# Patient Record
Sex: Male | Born: 1970 | Hispanic: Yes | Marital: Married | Smoking: Never smoker
Health system: Southern US, Community
[De-identification: ages and names within clinical notes are randomized; demographics above are authoritative.]

## PROBLEM LIST (undated history)

## (undated) DIAGNOSIS — I2699 Other pulmonary embolism without acute cor pulmonale: Secondary | ICD-10-CM

## (undated) DIAGNOSIS — T7840XA Allergy, unspecified, initial encounter: Secondary | ICD-10-CM

## (undated) DIAGNOSIS — C719 Malignant neoplasm of brain, unspecified: Secondary | ICD-10-CM

## (undated) DIAGNOSIS — E7800 Pure hypercholesterolemia, unspecified: Secondary | ICD-10-CM

## (undated) DIAGNOSIS — Z923 Personal history of irradiation: Secondary | ICD-10-CM

## (undated) DIAGNOSIS — C801 Malignant (primary) neoplasm, unspecified: Secondary | ICD-10-CM

## (undated) DIAGNOSIS — J189 Pneumonia, unspecified organism: Secondary | ICD-10-CM

## (undated) HISTORY — PX: TENDON REPAIR: SHX5111

## (undated) HISTORY — PX: OTHER SURGICAL HISTORY: SHX169

## (undated) SURGERY — BRONCHOSCOPY, WITH FLUOROSCOPY
Anesthesia: Moderate Sedation

---

## 2001-03-09 ENCOUNTER — Emergency Department (HOSPITAL_COMMUNITY): Admission: EM | Admit: 2001-03-09 | Discharge: 2001-03-10 | Payer: Self-pay | Admitting: Emergency Medicine

## 2001-03-10 ENCOUNTER — Encounter: Payer: Self-pay | Admitting: Emergency Medicine

## 2005-01-15 ENCOUNTER — Ambulatory Visit (HOSPITAL_COMMUNITY): Admission: RE | Admit: 2005-01-15 | Discharge: 2005-01-15 | Payer: Self-pay | Admitting: Orthopedic Surgery

## 2005-01-15 ENCOUNTER — Ambulatory Visit (HOSPITAL_BASED_OUTPATIENT_CLINIC_OR_DEPARTMENT_OTHER): Admission: RE | Admit: 2005-01-15 | Discharge: 2005-01-15 | Payer: Self-pay | Admitting: Orthopedic Surgery

## 2005-11-21 ENCOUNTER — Ambulatory Visit: Payer: Self-pay | Admitting: Internal Medicine

## 2006-01-27 ENCOUNTER — Ambulatory Visit: Payer: Self-pay | Admitting: Internal Medicine

## 2006-02-19 ENCOUNTER — Ambulatory Visit (HOSPITAL_BASED_OUTPATIENT_CLINIC_OR_DEPARTMENT_OTHER): Admission: RE | Admit: 2006-02-19 | Discharge: 2006-02-19 | Payer: Self-pay | Admitting: Orthopedic Surgery

## 2007-01-29 ENCOUNTER — Ambulatory Visit: Payer: Self-pay | Admitting: Internal Medicine

## 2007-02-03 ENCOUNTER — Ambulatory Visit: Payer: Self-pay | Admitting: *Deleted

## 2007-02-12 ENCOUNTER — Ambulatory Visit: Payer: Self-pay | Admitting: Internal Medicine

## 2007-02-23 ENCOUNTER — Ambulatory Visit: Payer: Self-pay | Admitting: Internal Medicine

## 2007-05-22 ENCOUNTER — Ambulatory Visit: Payer: Self-pay | Admitting: Nurse Practitioner

## 2007-05-22 DIAGNOSIS — J029 Acute pharyngitis, unspecified: Secondary | ICD-10-CM

## 2007-05-22 DIAGNOSIS — B009 Herpesviral infection, unspecified: Secondary | ICD-10-CM | POA: Insufficient documentation

## 2007-05-22 LAB — CONVERTED CEMR LAB
Cholesterol, target level: 200 mg/dL
HDL goal, serum: 40 mg/dL
LDL Goal: 160 mg/dL
Rapid Strep: NEGATIVE

## 2007-05-27 ENCOUNTER — Encounter (INDEPENDENT_AMBULATORY_CARE_PROVIDER_SITE_OTHER): Payer: Self-pay | Admitting: *Deleted

## 2008-02-17 ENCOUNTER — Ambulatory Visit: Payer: Self-pay | Admitting: Nurse Practitioner

## 2008-02-17 LAB — CONVERTED CEMR LAB
Basophils Absolute: 0 10*3/uL (ref 0.0–0.1)
Basophils Relative: 0 % (ref 0–1)
Eosinophils Absolute: 0.2 10*3/uL (ref 0.0–0.7)
Eosinophils Relative: 4 % (ref 0–5)
HCT: 44.7 % (ref 39.0–52.0)
Hemoglobin: 15.7 g/dL (ref 13.0–17.0)
Lymphocytes Relative: 39 % (ref 12–46)
Lymphs Abs: 2.1 10*3/uL (ref 0.7–4.0)
MCHC: 35.1 g/dL (ref 30.0–36.0)
MCV: 85.6 fL (ref 78.0–100.0)
Monocytes Absolute: 0.4 10*3/uL (ref 0.1–1.0)
Monocytes Relative: 8 % (ref 3–12)
Neutro Abs: 2.6 10*3/uL (ref 1.7–7.7)
Neutrophils Relative %: 49 % (ref 43–77)
Platelets: 242 10*3/uL (ref 150–400)
RBC: 5.22 M/uL (ref 4.22–5.81)
RDW: 12.5 % (ref 11.5–15.5)
WBC: 5.3 10*3/uL (ref 4.0–10.5)

## 2008-02-18 ENCOUNTER — Ambulatory Visit (HOSPITAL_COMMUNITY): Admission: RE | Admit: 2008-02-18 | Discharge: 2008-02-18 | Payer: Self-pay | Admitting: Nurse Practitioner

## 2008-02-19 ENCOUNTER — Encounter (INDEPENDENT_AMBULATORY_CARE_PROVIDER_SITE_OTHER): Payer: Self-pay | Admitting: Nurse Practitioner

## 2008-02-22 ENCOUNTER — Telehealth (INDEPENDENT_AMBULATORY_CARE_PROVIDER_SITE_OTHER): Payer: Self-pay | Admitting: Nurse Practitioner

## 2008-02-22 ENCOUNTER — Telehealth (INDEPENDENT_AMBULATORY_CARE_PROVIDER_SITE_OTHER): Payer: Self-pay | Admitting: *Deleted

## 2008-04-01 ENCOUNTER — Ambulatory Visit: Payer: Self-pay | Admitting: Nurse Practitioner

## 2008-04-01 DIAGNOSIS — M76899 Other specified enthesopathies of unspecified lower limb, excluding foot: Secondary | ICD-10-CM | POA: Insufficient documentation

## 2008-04-01 DIAGNOSIS — J309 Allergic rhinitis, unspecified: Secondary | ICD-10-CM | POA: Insufficient documentation

## 2008-04-05 ENCOUNTER — Ambulatory Visit: Payer: Self-pay | Admitting: Internal Medicine

## 2008-04-07 ENCOUNTER — Ambulatory Visit: Payer: Self-pay | Admitting: Internal Medicine

## 2008-04-26 ENCOUNTER — Ambulatory Visit: Payer: Self-pay | Admitting: Internal Medicine

## 2010-07-25 ENCOUNTER — Ambulatory Visit: Payer: Self-pay | Admitting: Nurse Practitioner

## 2010-07-25 DIAGNOSIS — B9789 Other viral agents as the cause of diseases classified elsewhere: Secondary | ICD-10-CM | POA: Insufficient documentation

## 2010-07-26 LAB — CONVERTED CEMR LAB
Basophils Absolute: 0 10*3/uL (ref 0.0–0.1)
Basophils Relative: 1 % (ref 0–1)
Eosinophils Absolute: 0.2 10*3/uL (ref 0.0–0.7)
Eosinophils Relative: 4 % (ref 0–5)
HCT: 42.6 % (ref 39.0–52.0)
Hemoglobin: 15.2 g/dL (ref 13.0–17.0)
Lymphocytes Relative: 29 % (ref 12–46)
Lymphs Abs: 1.6 10*3/uL (ref 0.7–4.0)
MCHC: 35.7 g/dL (ref 30.0–36.0)
MCV: 84 fL (ref 78.0–100.0)
Monocytes Absolute: 0.3 10*3/uL (ref 0.1–1.0)
Monocytes Relative: 6 % (ref 3–12)
Neutro Abs: 3.5 10*3/uL (ref 1.7–7.7)
Neutrophils Relative %: 62 % (ref 43–77)
Platelets: 246 10*3/uL (ref 150–400)
RBC: 5.07 M/uL (ref 4.22–5.81)
RDW: 12.3 % (ref 11.5–15.5)
WBC: 5.7 10*3/uL (ref 4.0–10.5)

## 2010-10-10 NOTE — Letter (Signed)
Summary: Out of Work  [facility]  [address] [place], [place] [address]   Phone: [phone]  Fax: [phone]    July 25, 2010   Employee:  Jones Broom    To Whom It May Concern:   For Medical reasons, please excuse the above named employee from work for the following dates:  Start:   July 25, 2010 (Wednesday) Seen in office today   July 26, 2010 (Thursday) Off End:   July 27, 2010 (Friday) May return to work  If you need additional information, please feel free to contact our office.         Sincerely,    Lehman Prom FNP

## 2010-10-10 NOTE — Letter (Signed)
Summary: Handout Printed  Printed Handout:  - Viral Illness 

## 2010-10-10 NOTE — Assessment & Plan Note (Signed)
Summary: Acute - Viral illness   Vital Signs:  Patient profile:   40 year old male Height:      66. inches Weight:      168.8 pounds BMI:     27.34 O2 Sat:      98 % on Room air Temp:     98.2 degrees F oral Pulse rate:   80 / minute Pulse rhythm:   regular Resp:     20 per minute BP sitting:   108 / 66  (left arm) Cuff size:   regular  Vitals Entered By: Levon Hedger (July 25, 2010 2:47 PM)  Nutrition Counseling: Patient's BMI is greater than 25 and therefore counseled on weight management options.  O2 Flow:  Room air CC: cold with chest tightness , joint pain  that has been going on since Sunday..he says it makes it very hard for him to breath Is Patient Diabetic? No Pain Assessment Patient in pain? no     Location: chest  Does patient need assistance? Functional Status Self care Ambulation Normal Comments he has been taking an over the counter generic of dayquil/nightquil   CC:  cold with chest tightness  and joint pain  that has been going on since Sunday..he says it makes it very hard for him to breath.  History of Present Illness:  Pt into the office with complaints of chest congestion. Symptoms started 3 days ago.  He missed 2 days out of work and was not able to go today due to sickness. slight cough +headache +joint ache +fever Denies any nausea and vomiting pt has taken daytime liquid tablets at home for the symptoms.  He started to take on yesterday. Also reports that oldest child is also sick.   Habits & Providers  Alcohol-Tobacco-Diet     Alcohol drinks/day: 0     Tobacco Status: never  Exercise-Depression-Behavior     Does Patient Exercise: no     Drug Use: no     Seat Belt Use: 100     Sun Exposure: occasionally  Allergies: 1)  ! Pcn 2)  ! Augmentin  Review of Systems General:  Complains of chills, fever, and weakness; denies loss of appetite. ENT:  Denies earache, nasal congestion, and sore throat. CV:  Complains of  fatigue. Resp:  Complains of cough; denies shortness of breath. GI:  Denies nausea and vomiting. MS:  Complains of joint pain.  Physical Exam  General:  alert.   Head:  normocephalic.   Ears:  bil ears with clear fluid Lungs:  normal breath sounds.   Heart:  normal rate and regular rhythm.   Msk:  up to the exam table Neurologic:  alert & oriented X3.     Impression & Recommendations:  Problem # 1:  VIRAL INFECTION (ICD-079.99) will check cbc today advised pt to stay out of work today and tomorrow handout given on viral illness advised pt to take theraflu over the counter for symptoms The following medications were removed from the medication list:    Ibuprofen 800 Mg Tabs (Ibuprofen) .Marland Kitchen... 1 tablet by mouth three times a day x 3 days then prn  Orders: T-CBC w/Diff (16109-60454) Pulse Oximetry (single measurment) (09811)  Patient Instructions: 1)  Your blood will be checked today.  Results will be back tomorrow to determine if illness is viral or bacterial. 2)  Viral infections are treated with symptomatic management. 3)  Bacterial infections are treated with antibiotics.  It is important to know you have bacteria  before taking antibiotics. 4)  You will be called on tomorrow with the results. 5)  I will write you a note for work today and tomorrow. 6)  You should get theraflu over the counter and take tonight and tomorrow.  Also rest is important.  You are probably going to feel very weak.  drink plenty of fluids.   Orders Added: 1)  Est. Patient Level III [11914] 2)  T-CBC w/Diff [[id number]] 3)  Pulse Oximetry (single measurment) [86578]

## 2011-01-25 NOTE — Op Note (Signed)
Chad George, Chad George             ACCOUNT NO.:  [id number]   MEDICAL RECORD NO.:  [id number]          PATIENT TYPE:  AMB   LOCATION:  DSC                          FACILITY:  [facility]   PHYSICIAN:  Cindee Salt, M.D.       DATE OF BIRTH:  06/08/1971   DATE OF PROCEDURE:  01/15/2005  DATE OF DISCHARGE:                                 OPERATIVE REPORT   PREOPERATIVE DIAGNOSIS:  Ulnocarpal abutment triangle fibrocartilage complex  tear, right wrist.   POSTOPERATIVE DIAGNOSIS:  Ulnocarpal abutment triangle fibrocartilage  complex tear, right wrist.   OPERATION:  Arthroscopy with debridement triangle fibrocartilage traumatic  tear with ulnar shortening osteotomy, right wrist.   SURGEON:  Dr. Merlyn Lot   ASSISTANT:  Carolyne Fiscal R.N.   ANESTHESIA:  General.   HISTORY:  The patient is a 40 year old male with a history of ulnar-sided  wrist pain.  He has undergone an MRA of his wrist, revealing a triangle  fibrocartilage complex tear on examination.  With this compression, he is  noted to have a long ulna.   PROCEDURE:  The patient is brought to the operating room where a general  anesthetic was carried out without difficulty, was prepped, draped using  DuraPrep, supine position, right arm free.  The limb was placed in the  arthroscopy tower, 10 pounds of traction applied, joint inflated with the  3/4 portal.  A blunt trocar was used enter the joint after making a  transverse incision through skin only.  Care was taken to avoid the extensor  pollicis longus tendon.  The scope was introduced, inspected.  The volar  radial wrist ligaments were intact.  The scapholunate ligament was intact.  The cartilage showed no changes on the distal radius, the scaphoid, the  lunate.  Lunotriquetral joint showed no change.  Irrigation catheter was  placed in 6U.  A 4-5 portal was opened after localization with 22 gauge  needle.  Triangle fibrocartilage showed a transverse tear across mid  section.  The dorsal and  palmar condensations were intact.  The scope was  then introduced in the ulnar aspect.  The lunotriquetral joint showed no  injury.  Midcarpal joint was then inspected through the radial midcarpal  portal after inflation with saline.  The scope introduced, the SVT joint  showed no changes.  There were no cartilage changes on the distal portion of  the proximal or proximal portion of the distal row.  There was type 2 hamate  facet in the lunate.  There there was no change in the hamate or in the  lunate.  The scope was reintroduced into the 03/04 portal.  An ArthroWand  wand was used to debride the triangle fibrocartilage along with a suction  punch.  This converted the tear to a traumatic tear to a degenerative-type  tear with the ulnar head being visible.  There were some chondromatotic  changes present.  It was decided preoperatively that if this was a not  repairable traumatic-type tear, then an ulnar shortening osteotomy would be  performed.  The limb was exsanguinated with an Esmarch bandage after removal  of  the arthroscopy instruments, an incision made on the ulnar aspect of the  wrist, carried down through subcutaneous tissue, bleeders electrocauterized,  neural structures protected.  The dissection carried down between the fifth  and sixth dorsal compartment to the distal ulna.  A six-hole DCP plate was  then applied distally with three 16 mm screws.  The screws were then  removed, the plate rotated, and osteotomy was then performed after placing  retractors to protect the soft tissues.  This removed approximately 3 mm of  ulna. The distal plate was reapproximated with the most proximal screw being  under compression.  This was not tightened.  The alignment was established  after being marked prior to the osteotomy site. Clamps were placed, and the  proximal screws were inserted.  These each measured 16 mm.  Each was placed  under compression progressively, and the final compression  screw distally  was tightened.  X-rays confirmed good shortening of the ulna with no further  ulnocarpal abutment.  The osteotomy site showed to be entirely closed.  Small portions of bone were used as a bone graft, the wound irrigated.  The  fascia was closed with a running 4-0 Vicryl suture, the subcutaneous tissue  with 4-0 Vicryl, and skin with subcuticular 4-0 Monocryl suture.  The  arthroscopy portals were closed with interrupted 5-0 nylon sutures.  Sterile  compressive dressing and long-arm splint applied.  The patient tolerated the  procedure well.  Prior to the placement of the splint, the arm was placed  through full pronation and  supination without difficulty or any clicks or  crepitation.  The patient tolerated the procedure well was taken to the  recovery room for observation in satisfactory condition.  On deflation of  the tourniquet, all fingers immediately pinked.  He will be kept overnight  for pain control and discharged on Percocet.      GK/MEDQ  D:  01/15/2005  T:  01/15/2005  Job:  [id number]

## 2011-01-25 NOTE — Op Note (Signed)
Chad George, Chad George             ACCOUNT NO.:  [id number]   MEDICAL RECORD NO.:  [id number]          PATIENT TYPE:  AMB   LOCATION:  DSC                          FACILITY:  [facility]   PHYSICIAN:  Cindee Salt, M.D.       DATE OF BIRTH:  04-11-1971   DATE OF PROCEDURE:  DATE OF DISCHARGE:                                 OPERATIVE REPORT   PREOPERATIVE DIAGNOSIS:  Status post ulnar shortening osteotomy, right ulna.   POSTOPERATIVE DIAGNOSIS:  Status post ulnar shortening osteotomy, right  ulna.   OPERATION:  Removal plate and screws, right ulna.   SURGEON:  Dr. Merlyn Lot.   ASSISTANT:  Carolyne Fiscal R.N.   ANESTHESIA:  General.   HISTORY:  The patient is a 40 year old male, with history of ulnocarpal  abutment.  He has undergone ulnar shortening osteotomy.  This has healed.  He has irritation of the plate, desires removal of plate and screws.  He is  aware of risks and complications, which were thoroughly discussed prior to  the surgery.  In the preoperative area, the arm was marked by both the  patient and surgeon, questions encouraged and answered.  Antibiotic was  ordered.   PROCEDURE:  The patient was brought to the operating room, where general  anesthetic was carried out without difficulty was prepped using DuraPrep,  supine position, right arm free.  The limb was exsanguinated with an Esmarch  bandage and tourniquet placed on the arm  was inflated to 250 mmHg.  The old  incision was used carried down through subcutaneous tissue.  Bleeders were  electrocauterized.  Dissection carried down to the interval between the  extensor digiti quinti and to the extensor carpi ulnaris.  Dissection  carried down to the plate.  This was identified.  Freer elevator was used to  elevate the periosteum from the plate without difficulty.  The plate was  removed.  The osteotomy site was fully healed.  No further lesions were  identified.  The interval between the screw and plate were granulation  tissue,  where scar had occurred were removed.  The wound was again  irrigated.  The fascia was then closed with a running 4-0 Vicryl suture.  The subcutaneous tissue with 4-0 Vicryl and skin with a subcuticular 4-0  Monocryl suture.  Steri-Strips were applied.  Sterile compressive dressing  and wrist splint applied.  The patient tolerated the procedure well.  He was  taken to the recovery for observation in satisfactory condition.  He is  discharged home to return to __________ [facility] in 1 week on Vicodin.           ______________________________  Cindee Salt, M.D.     GK/MEDQ  D:  02/19/2006  T:  02/19/2006  Job:  [id number]

## 2013-03-24 ENCOUNTER — Emergency Department (INDEPENDENT_AMBULATORY_CARE_PROVIDER_SITE_OTHER)
Admission: EM | Admit: 2013-03-24 | Discharge: 2013-03-24 | Disposition: A | Discharge status: Home / Self Care | Payer: Self-pay | Source: Home / Self Care

## 2013-03-24 ENCOUNTER — Emergency Department (INDEPENDENT_AMBULATORY_CARE_PROVIDER_SITE_OTHER): Payer: Self-pay

## 2013-03-24 ENCOUNTER — Encounter (HOSPITAL_COMMUNITY): Payer: Self-pay | Admitting: Emergency Medicine

## 2013-03-24 DIAGNOSIS — M79609 Pain in unspecified limb: Secondary | ICD-10-CM

## 2013-03-24 DIAGNOSIS — M79671 Pain in right foot: Secondary | ICD-10-CM

## 2013-03-24 MED ORDER — TRAMADOL HCL 50 MG PO TABS: 50.0000 mg | ORAL_TABLET | Freq: Four times a day (QID) | ORAL | Status: DC | PRN

## 2013-03-24 MED ORDER — AMITRIPTYLINE HCL 25 MG PO TABS: 25.0000 mg | ORAL_TABLET | Freq: Every evening | ORAL | Status: DC | PRN

## 2013-03-24 NOTE — ED Provider Notes (Addendum)
Chad George is a 42 y.o. male who presents to Urgent Care today for bilateral lower extremity pain. Starting about one week ago without injury. Patient has pain from the metatarsals on the plantar aspect of both feet especially around the second metatarsal heads to the plantar calcaneus radiating to the knees and the posterior aspect of the legs. This is worse on the left than the right. No weakness or numbness noted. Pain is quite severe and limiting the patient's ability to work. He works as an Personnel officer frequently climbs ladders and denies any change in his normal activity. He denies any fevers or chills bowel or bladder dysfunctions.    PMH reviewed. Healthy otherwise History  Substance Use Topics  . Smoking status: Never Smoker   . Smokeless tobacco: Not on file  . Alcohol Use: No   ROS as above Medications reviewed. No current facility-administered medications for this encounter.   Current Outpatient Prescriptions  Medication Sig Dispense Refill  . amitriptyline (ELAVIL) 25 MG tablet Take 1 tablet (25 mg total) by mouth at bedtime as needed for pain.  30 tablet  1  . traMADol (ULTRAM) 50 MG tablet Take 1 tablet (50 mg total) by mouth every 6 (six) hours as needed for pain.  50 tablet  0    Exam:  BP 122/77  Pulse 67  Temp(Src) 98.5 F (36.9 C) (Oral)  Resp 17  SpO2 97% Gen: Well NAD Exts: Non edematous BL  LE, both extremities have intact pulses and capillary refill however are slightly cold and clammy to the touch.  Quite sensitive to touch bilateral plantar and dorsal feet.  MSK: Tender palpation bilateral plantar metatarsal heads metatarsal #2.  Tender palpation bilateral plantar calcaneus.  Nontender into the calf with no change in pain with foot and knee motion.  Normal motion and strength.   No results found for this or any previous visit (from the past 24 hour(s)). Dg Foot Complete Left  03/24/2013   *RADIOLOGY REPORT*  Clinical Data: Foot pain  LEFT FOOT -  COMPLETE 3+ VIEW  Comparison: None.  Findings: There is no evidence of fracture or dislocation.  There is no evidence of arthropathy or other focal bone abnormality. Soft tissues are unremarkable.  IMPRESSION: Negative exam.   Original Report Authenticated By: Signa Kell, M.D.   Dg Foot Complete Right  03/24/2013   *RADIOLOGY REPORT*  Clinical Data: Right foot pain  RIGHT FOOT COMPLETE - 3+ VIEW  Comparison: None  Findings: There is no evidence of fracture or dislocation.  There is no evidence of arthropathy or other focal bone abnormality. Soft tissues are unremarkable.  IMPRESSION: Negative exam.   Original Report Authenticated By: Signa Kell, M.D.    Assessment and Plan: 42 y.o. male with unexplained bilateral foot pain.  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  Doubtful for serious pathology such as claudication or infection.  Additionally down for for severe lumbar radicular cause as pain is mostly confined to the feet.  Plan to treat empirically with amitriptyline at night and tramadol as needed.  Follow up with sports medicine Center if not improving Provided information for the orange card.  Discussed warning signs or symptoms. Please see discharge instructions. Patient expresses understanding.      Rodolph Bong, MD 03/24/13 1743  Rodolph Bong, MD 03/24/13 [phone]

## 2013-03-24 NOTE — ED Notes (Signed)
Pt reports bilateral foot pain that started under great toe and radiates through out bottom of foot to the achilles pain has gradually gotten worse and is also radiating up to the posterior portion of the knee.  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 Pt denies injury. Pt has used ibuprofen with no relief in symptoms.

## 2013-07-01 ENCOUNTER — Ambulatory Visit: Payer: No Typology Code available for payment source | Attending: Internal Medicine

## 2013-08-16 ENCOUNTER — Ambulatory Visit: Payer: No Typology Code available for payment source | Attending: Internal Medicine | Admitting: Internal Medicine

## 2013-08-16 ENCOUNTER — Encounter: Payer: Self-pay | Admitting: Internal Medicine

## 2013-08-16 VITALS — BP 115/83 | HR 64 | Temp 98.7°F | Resp 16 | Ht 65.0 in | Wt 172.0 lb

## 2013-08-16 DIAGNOSIS — Z0000 Encounter for general adult medical examination without abnormal findings: Secondary | ICD-10-CM

## 2013-08-16 NOTE — Patient Instructions (Signed)
Prostate-Specific Antigen The prostate-specific antigen (PSA) is a blood test. It is used to help detect early forms of prostate cancer. The test is usually used along with other tests. The test is also used to follow the course of those who already have prostate cancer or who have been treated for prostate cancer. Some factors interfere with the results of the PSA. The factors listed below will either increase or decrease the PSA levels. They are:  Prescriptions used for male baldness.  Some herbs.  Active prostate infection.  Prior instrumentation or urinary catheterization.  Ejaculation up to 2 days prior to testing.  A noncancerous enlargement of the prostate.  Inflammation of the prostate.  Active urinary tract infection. If your test results are elevated, your caregiver will discuss the results with you. Your caregiver will also let you know if more evaluation is needed. PREPARATION FOR TEST No preparation or fasting is necessary. NORMAL FINDINGS Less than 4 ng/mL or Less than 4mcg/L (SI units) Ranges for normal findings may vary among different laboratories and hospitals. You should always check with your caregiver after having lab work or other tests done to discuss the meaning of your test results and whether your values are considered within normal limits. MEANING OF TEST  A normal value means prostate cancer is less likely. The chance of having prostate cancer increases if the value is between 4 ng/mL and 10 ng/mL. However, further testing will be needed. Values above 10 ng/mL indicate that there is a much higher chance of having prostate cancer (if the above situations that raise PSA are not present). Your caregiver will go over your test results with you and discuss the importance of this test. If this value is elevated, your caregiver may recommend further testing or evaluation. OBTAINING THE TEST RESULTS It is your responsibility to obtain your test results. Ask the lab or  department performing the test when and how you will get your results. Document Released: 09/28/2004 Document Revised: 11/18/2011 Document Reviewed: 04/03/2007 Exit[employer] Patient Information 2014 [employer].  

## 2013-08-16 NOTE — Progress Notes (Signed)
Patient ID: Chad George, male   DOB: [birth year], 42 y.o.   MRN: [id number] Patient Demographics  Chad George, is a 42 y.o. male  NWG:[id number]  [id number]  DOB - 15-Dec-1970  CC:  Chief Complaint  Patient presents with  . Establish Care       HPI: Chad George is a 42 y.o. male here today to establish medical care. No significant past medical history. He is here for a physical exam. He has no significant complaint. He is doing very well, he does not smoke cigarette, she does not drink alcohol. No significant family history of diabetes or hypertension or cancer. Patient has No headache, No chest pain, No abdominal pain - No Nausea, No new weakness tingling or numbness, No Cough - SOB.  Allergies  Allergen Reactions  . Amoxicillin-Pot Clavulanate   . Penicillins     REACTION: RASH   History reviewed. No pertinent past medical history. Current Outpatient Prescriptions on File Prior to Visit  Medication Sig Dispense Refill  . amitriptyline (ELAVIL) 25 MG tablet Take 1 tablet (25 mg total) by mouth at bedtime as needed for pain.  30 tablet  1  . traMADol (ULTRAM) 50 MG tablet Take 1 tablet (50 mg total) by mouth every 6 (six) hours as needed for pain.  50 tablet  0   No current facility-administered medications on file prior to visit.   Family History  Problem Relation Age of Onset  . Stroke Father    History   Social History  . Marital Status: Single    Spouse Name: N/A    Number of Children: N/A  . Years of Education: N/A   Occupational History  . Not on file.   Social History Main Topics  . Smoking status: Never Smoker   . Smokeless tobacco: Not on file  . Alcohol Use: No  . Drug Use: No  . Sexual Activity: Yes   Other Topics Concern  . Not on file   Social History Narrative  . No narrative on file    Review of Systems: Constitutional: Negative for fever, chills, diaphoresis, activity change, appetite change and fatigue. HENT: Negative for ear pain,  nosebleeds, congestion, facial swelling, rhinorrhea, neck pain, neck stiffness and ear discharge.  Eyes: Negative for pain, discharge, redness, itching and visual disturbance. Respiratory: Negative for cough, choking, chest tightness, shortness of breath, wheezing and stridor.  Cardiovascular: Negative for chest pain, palpitations and leg swelling. Gastrointestinal: Negative for abdominal distention. Genitourinary: Negative for dysuria, urgency, frequency, hematuria, flank pain, decreased urine volume, difficulty urinating and dyspareunia.  Musculoskeletal: Negative for back pain, joint swelling, arthralgia and gait problem. Neurological: Negative for dizziness, tremors, seizures, syncope, facial asymmetry, speech difficulty, weakness, light-headedness, numbness and headaches.  Hematological: Negative for adenopathy. Does not bruise/bleed easily. Psychiatric/Behavioral: Negative for hallucinations, behavioral problems, confusion, dysphoric mood, decreased concentration and agitation.    Objective:   Filed Vitals:   08/16/13 1654  BP: 115/83  Pulse: 64  Temp: 98.7 F (37.1 C)  Resp: 16    Physical Exam: Constitutional: Patient appears well-developed and well-nourished. No distress. HENT: Normocephalic, atraumatic, External right and left ear normal. Oropharynx is clear and moist.  Eyes: Conjunctivae and EOM are normal. PERRLA, no scleral icterus. Neck: Normal ROM. Neck supple. No JVD. No tracheal deviation. No thyromegaly. CVS: RRR, S1/S2 +, no murmurs, no gallops, no carotid bruit.  Pulmonary: Effort and breath sounds normal, no stridor, rhonchi, wheezes, rales.  Abdominal: Soft. BS +, no distension, tenderness, rebound or guarding.  Musculoskeletal: Normal range of motion. No edema and no tenderness.  Lymphadenopathy: No lymphadenopathy noted, cervical, inguinal or axillary Neuro: Alert. Normal reflexes, muscle tone coordination. No cranial nerve deficit. Skin: Skin is warm and  dry. No rash noted. Not diaphoretic. No erythema. No pallor. Psychiatric: Normal mood and affect. Behavior, judgment, thought content normal.  Lab Results  Component Value Date   WBC 5.7 07/25/2010   HGB 15.2 07/25/2010   HCT 42.6 07/25/2010   MCV 84.0 07/25/2010   PLT 246 07/25/2010   No results found for this basename: CREATININE, BUN, NA, K, CL, CO2    No results found for this basename: HGBA1C   Lipid Panel  No results found for this basename: chol, trig, hdl, cholhdl, vldl, ldlcalc       Assessment and plan:   1. Annual physical exam  - CMP and Liver - CBC with Differential - Urinalysis, Complete - POCT glycosylated hemoglobin (Hb A1C) - Lipid panel - TSH  Follow up in  6 months or when necessary   The patient was given clear instructions to go to ER or return to medical center if symptoms don't improve, worsen or new problems develop. The patient verbalized understanding. The patient was told to call to get lab results if they haven't heard anything in the next week.     Jeanann Lewandowsky, MD, MHA, FACP, FAAP [facility] [place], [place] [phone]   08/16/2013, 5:26 PM

## 2013-08-16 NOTE — Progress Notes (Signed)
Pt is here to establish care. He has a history of GERD and allergy's.  Pt is requesting a physical and a check up.

## 2013-08-17 LAB — URINALYSIS, COMPLETE
Bilirubin Urine: NEGATIVE
Crystals: NONE SEEN
Glucose, UA: NEGATIVE mg/dL
Specific Gravity, Urine: 1.017 (ref 1.005–1.030)
Squamous Epithelial / LPF: NONE SEEN

## 2013-08-17 LAB — CMP AND LIVER
Alkaline Phosphatase: 78 U/L (ref 39–117)
BUN: 11 mg/dL (ref 6–23)
Glucose, Bld: 98 mg/dL (ref 70–99)
Indirect Bilirubin: 0.5 mg/dL (ref 0.0–0.9)
Sodium: 137 mEq/L (ref 135–145)
Total Bilirubin: 0.6 mg/dL (ref 0.3–1.2)
Total Protein: 7.1 g/dL (ref 6.0–8.3)

## 2013-08-17 LAB — CBC WITH DIFFERENTIAL/PLATELET
HCT: 43 % (ref 39.0–52.0)
Hemoglobin: 15.8 g/dL (ref 13.0–17.0)
Lymphocytes Relative: 31 % (ref 12–46)
Lymphs Abs: 1.8 10*3/uL (ref 0.7–4.0)
Monocytes Absolute: 0.3 10*3/uL (ref 0.1–1.0)
Monocytes Relative: 5 % (ref 3–12)
Neutro Abs: 3.5 10*3/uL (ref 1.7–7.7)
WBC: 5.9 10*3/uL (ref 4.0–10.5)

## 2013-08-17 LAB — LIPID PANEL
LDL Cholesterol: 92 mg/dL (ref 0–99)
Total CHOL/HDL Ratio: 5 Ratio
Triglycerides: 239 mg/dL — ABNORMAL HIGH (ref <150)
VLDL: 48 mg/dL — ABNORMAL HIGH (ref 0–40)

## 2013-08-20 ENCOUNTER — Telehealth: Payer: Self-pay | Admitting: *Deleted

## 2013-08-20 NOTE — Telephone Encounter (Signed)
Message copied by Raynelle Chary on Fri Aug 20, 2013 12:25 PM ------      Message from: Jeanann Lewandowsky E      Created: Thu Aug 19, 2013  4:05 PM       Please inform patient that most of his lab results are within normal limits. His cholesterol is slightly high, we recommend regular exercise 3 times a week 30 minutes each time, low-fat and low-cholesterol diet, we will repeat this test in 3 months ------

## 2013-08-20 NOTE — Telephone Encounter (Signed)
Message copied by Raynelle Chary on Fri Aug 20, 2013  3:02 PM ------      Message from: Jeanann Lewandowsky E      Created: Thu Aug 19, 2013  4:05 PM       Please inform patient that most of his lab results are within normal limits. His cholesterol is slightly high, we recommend regular exercise 3 times a week 30 minutes each time, low-fat and low-cholesterol diet, we will repeat this test in 3 months ------

## 2013-08-20 NOTE — Telephone Encounter (Signed)
Pt called and I told him his lab results.  I explained to the pt the dr. recommendation of exercise and a healthy diet.

## 2013-09-07 ENCOUNTER — Telehealth: Payer: Self-pay | Admitting: Internal Medicine

## 2013-09-07 ENCOUNTER — Telehealth: Payer: Self-pay | Admitting: Emergency Medicine

## 2013-09-07 NOTE — Telephone Encounter (Signed)
Attempted to leave message but no answer 

## 2013-10-01 ENCOUNTER — Encounter (HOSPITAL_COMMUNITY): Payer: Self-pay | Admitting: Emergency Medicine

## 2013-10-01 ENCOUNTER — Emergency Department (HOSPITAL_COMMUNITY)
Admission: EM | Admit: 2013-10-01 | Discharge: 2013-10-01 | Disposition: A | Discharge status: Home / Self Care | Payer: No Typology Code available for payment source | Source: Home / Self Care | Attending: Family Medicine | Admitting: Family Medicine

## 2013-10-01 ENCOUNTER — Emergency Department (INDEPENDENT_AMBULATORY_CARE_PROVIDER_SITE_OTHER): Payer: No Typology Code available for payment source

## 2013-10-01 DIAGNOSIS — J189 Pneumonia, unspecified organism: Secondary | ICD-10-CM

## 2013-10-01 HISTORY — DX: Pure hypercholesterolemia, unspecified: E78.00

## 2013-10-01 MED ORDER — CEFTRIAXONE SODIUM 1 G IJ SOLR
INTRAMUSCULAR | Status: AC
  Filled 2013-10-01: qty 10

## 2013-10-01 MED ORDER — CEFTRIAXONE SODIUM 1 G IJ SOLR
1.0000 g | Freq: Once | INTRAMUSCULAR | Status: AC
  Administered 2013-10-01: 1 g via INTRAMUSCULAR

## 2013-10-01 MED ORDER — MOXIFLOXACIN HCL 400 MG PO TABS: 400.0000 mg | ORAL_TABLET | Freq: Every day | ORAL | Status: DC

## 2013-10-01 NOTE — ED Provider Notes (Signed)
CSN: [id number]     Arrival date & time 10/01/13  1056 History   First MD Initiated Contact with Patient 10/01/13 1116     Chief Complaint  Patient presents with  . URI   (Consider location/radiation/quality/duration/timing/severity/associated sxs/prior Treatment) Patient is a 43 y.o. male presenting with URI. The history is provided by the patient and the spouse.  URI Presenting symptoms: congestion, cough, fever and rhinorrhea   Severity:  Moderate Onset quality:  Gradual Duration:  5 days Timing:  Constant Progression:  Unchanged Chronicity:  New Worsened by:  Nothing tried Ineffective treatments:  None tried Risk factors: sick contacts     Past Medical History  Diagnosis Date  . High cholesterol    Past Surgical History  Procedure Laterality Date  . Carpal tunner    . Tendon repair     Family History  Problem Relation Age of Onset  . Stroke Father    History  Substance Use Topics  . Smoking status: Never Smoker   . Smokeless tobacco: Not on file  . Alcohol Use: No    Review of Systems  Constitutional: Positive for fever.  HENT: Positive for congestion, postnasal drip and rhinorrhea.   Respiratory: Positive for cough.   Cardiovascular: Negative.   Gastrointestinal: Negative.     Allergies  Amoxicillin-pot clavulanate and Penicillins  Home Medications   Current Outpatient Rx  Name  Route  Sig  Dispense  Refill  . acetaminophen (TYLENOL) 325 MG tablet   Oral   Take 650 mg by mouth every 6 (six) hours as needed.         Marland Kitchen ibuprofen (ADVIL,MOTRIN) 200 MG tablet   Oral   Take 200 mg by mouth every 6 (six) hours as needed.         Marland Kitchen amitriptyline (ELAVIL) 25 MG tablet   Oral   Take 1 tablet (25 mg total) by mouth at bedtime as needed for pain.   30 tablet   1   . moxifloxacin (AVELOX) 400 MG tablet   Oral   Take 1 tablet (400 mg total) by mouth daily.   7 tablet   0   . traMADol (ULTRAM) 50 MG tablet   Oral   Take 1 tablet (50 mg total)  by mouth every 6 (six) hours as needed for pain.   50 tablet   0    There were no vitals taken for this visit. Physical Exam  Nursing note and vitals reviewed. Constitutional: He is oriented to person, place, and time. He appears well-developed and well-nourished.  HENT:  Right Ear: External ear normal.  Left Ear: External ear normal.  Nose: Nose normal.  Mouth/Throat: Oropharynx is clear and moist.  Eyes: Conjunctivae are normal. Pupils are equal, round, and reactive to light.  Neck: Normal range of motion. Neck supple.  Cardiovascular: Normal rate, regular rhythm, normal heart sounds and intact distal pulses.   Pulmonary/Chest: Effort normal. He has rales in the left upper field, the left middle field and the left lower field.  Abdominal: Soft. Bowel sounds are normal. There is no tenderness.  Lymphadenopathy:    He has no cervical adenopathy.  Neurological: He is alert and oriented to person, place, and time.  Skin: Skin is warm and dry.    ED Course  Procedures (including critical care time) Labs Review Labs Reviewed - No data to display Imaging Review Dg Chest 2 View  10/01/2013   CLINICAL DATA:  Fever and cough.  EXAM: CHEST  2  VIEW  COMPARISON:  None.  FINDINGS: The cardiomediastinal silhouette is within normal limits. The lungs are well inflated. There is airspace opacity in the left upper lobe. The right lung is clear. No pleural effusion is identified. No acute osseous abnormality is identified.  IMPRESSION: Left upper lobe opacity concerning for pneumonia.   Electronically Signed   By: Logan Bores   On: 10/01/2013 12:39    EKG Interpretation    Date/Time:    Ventricular Rate:    PR Interval:    QRS Duration:   QT Interval:    QTC Calculation:   R Axis:     Text Interpretation:              MDM  X-rays reviewed and report per radiologist.     Billy Fischer, MD 10/01/13 1355

## 2013-10-01 NOTE — ED Notes (Signed)
Patient not in treatment room, in rest room

## 2013-10-01 NOTE — ED Notes (Signed)
Patient aware of post injection delay prior to discharge.

## 2013-10-01 NOTE — ED Notes (Signed)
Patient has fever, cough, headache for 4 days, chest discomfort.  Frequent cough.

## 2013-10-01 NOTE — Discharge Instructions (Signed)
Drink plenty of fluids as discussed, use medicine as prescribed, and mucinex or delsym for cough. Return or see your doctor if further problems °

## 2013-10-08 ENCOUNTER — Ambulatory Visit: Payer: No Typology Code available for payment source | Attending: Internal Medicine

## 2013-10-08 ENCOUNTER — Other Ambulatory Visit: Payer: Self-pay | Admitting: Internal Medicine

## 2013-10-08 MED ORDER — GUAIFENESIN-DM 100-10 MG/5ML PO SYRP: 5.0000 mL | ORAL_SOLUTION | ORAL | Status: DC | PRN

## 2013-10-11 ENCOUNTER — Ambulatory Visit: Payer: No Typology Code available for payment source | Admitting: Internal Medicine

## 2013-10-16 ENCOUNTER — Ambulatory Visit: Payer: No Typology Code available for payment source | Attending: Internal Medicine | Admitting: Internal Medicine

## 2013-10-16 VITALS — BP 115/69 | HR 80 | Temp 98.5°F | Resp 16 | Ht 66.0 in | Wt 171.0 lb

## 2013-10-16 DIAGNOSIS — Z79899 Other long term (current) drug therapy: Secondary | ICD-10-CM | POA: Insufficient documentation

## 2013-10-16 DIAGNOSIS — J189 Pneumonia, unspecified organism: Secondary | ICD-10-CM | POA: Insufficient documentation

## 2013-10-16 DIAGNOSIS — J209 Acute bronchitis, unspecified: Secondary | ICD-10-CM

## 2013-10-16 DIAGNOSIS — Z880 Allergy status to penicillin: Secondary | ICD-10-CM | POA: Insufficient documentation

## 2013-10-16 DIAGNOSIS — E7800 Pure hypercholesterolemia, unspecified: Secondary | ICD-10-CM | POA: Insufficient documentation

## 2013-10-16 MED ORDER — FLUTICASONE PROPIONATE 50 MCG/ACT NA SUSP: 2.0000 | Freq: Every day | NASAL | Status: DC

## 2013-10-16 MED ORDER — ALBUTEROL SULFATE HFA 108 (90 BASE) MCG/ACT IN AERS: 2.0000 | INHALATION_SPRAY | Freq: Four times a day (QID) | RESPIRATORY_TRACT | Status: DC | PRN

## 2013-10-16 MED ORDER — GUAIFENESIN-CODEINE 100-10 MG/5ML PO SOLN: 10.0000 mL | Freq: Four times a day (QID) | ORAL | Status: DC | PRN

## 2013-10-16 MED ORDER — LORATADINE 10 MG PO TABS: 10.0000 mg | ORAL_TABLET | Freq: Every day | ORAL | Status: DC

## 2013-10-16 MED ORDER — LORATADINE 10 MG PO TABS: 10.0000 mg | ORAL_TABLET | Freq: Every day | ORAL | Status: AC

## 2013-10-16 NOTE — Progress Notes (Signed)
Patient ID: Chad George, male   DOB: 1971-01-06, 43 y.o.   MRN: [id number]   CC:  HPI: 43 year old male recently evaluated for pneumonia on 1/23. The patient presented with URI like symptoms, postnasal drip, rhinorrhea, cough and fever. X-ray showed left upper lung opacity concerning for pneumonia. Today the patient has completed 7 days of Avelox, has a residual cough. The patient complains of occasional wheezing, still has spasmodic cough associated with a headache and sinus congestion. No reported fever or sore throat    Allergies  Allergen Reactions  . Amoxicillin-Pot Clavulanate   . Penicillins     REACTION: RASH   Past Medical History  Diagnosis Date  . High cholesterol    Current Outpatient Prescriptions on File Prior to Visit  Medication Sig Dispense Refill  . acetaminophen (TYLENOL) 325 MG tablet Take 650 mg by mouth every 6 (six) hours as needed.      Marland Kitchen amitriptyline (ELAVIL) 25 MG tablet Take 1 tablet (25 mg total) by mouth at bedtime as needed for pain.  30 tablet  1  . traMADol (ULTRAM) 50 MG tablet Take 1 tablet (50 mg total) by mouth every 6 (six) hours as needed for pain.  50 tablet  0   No current facility-administered medications on file prior to visit.   Family History  Problem Relation Age of Onset  . Stroke Father    History   Social History  . Marital Status: Single    Spouse Name: N/A    Number of Children: N/A  . Years of Education: N/A   Occupational History  . Not on file.   Social History Main Topics  . Smoking status: Never Smoker   . Smokeless tobacco: Not on file  . Alcohol Use: No  . Drug Use: No  . Sexual Activity: Yes   Other Topics Concern  . Not on file   Social History Narrative  . No narrative on file    Review of Systems  Constitutional: As in history of present illness HENT: Negative for ear pain, nosebleeds, congestion, facial swelling, rhinorrhea, neck pain, neck stiffness and ear discharge.   Eyes: Negative for pain,  discharge, redness, itching and visual disturbance.  Respiratory: Positive for cough, choking, chest tightness, shortness of breath, wheezing and stridor.   Cardiovascular: Negative for chest pain, palpitations and leg swelling.  Gastrointestinal: Negative for abdominal distention.  Genitourinary: Negative for dysuria, urgency, frequency, hematuria, flank pain, decreased urine volume, difficulty urinating and dyspareunia.  Musculoskeletal: Negative for back pain, joint swelling, arthralgias and gait problem.  Neurological: Negative for dizziness, tremors, seizures, syncope, facial asymmetry, speech difficulty, weakness, light-headedness, numbness and headaches.  Hematological: Negative for adenopathy. Does not bruise/bleed easily.  Psychiatric/Behavioral: Negative for hallucinations, behavioral problems, confusion, dysphoric mood, decreased concentration and agitation.    Objective:   Filed Vitals:   10/16/13 0917  BP: 115/69  Pulse: 80  Temp: 98.5 F (36.9 C)  Resp: 16    Physical Exam  Constitutional: Appears well-developed and well-nourished. No distress.  HENT: Normocephalic. External right and left ear normal. Oropharynx is clear and moist.  Eyes: Conjunctivae and EOM are normal. PERRLA, no scleral icterus.  Neck: Normal ROM. Neck supple. No JVD. No tracheal deviation. No thyromegaly.  CVS: RRR, S1/S2 +, no murmurs, no gallops, no carotid bruit.  Pulmonary: Effort and breath sounds normal, no stridor, rhonchi, wheezes, rales.  Abdominal: Soft. BS +,  no distension, tenderness, rebound or guarding.  Musculoskeletal: Normal range of motion. No edema and  no tenderness.  Lymphadenopathy: No lymphadenopathy noted, cervical, inguinal. Neuro: Alert. Normal reflexes, muscle tone coordination. No cranial nerve deficit. Skin: Skin is warm and dry. No rash noted. Not diaphoretic. No erythema. No pallor.  Psychiatric: Normal mood and affect. Behavior, judgment, thought content normal.    Lab Results  Component Value Date   WBC 5.9 08/16/2013   HGB 15.8 08/16/2013   HCT 43.0 08/16/2013   MCV 83.0 08/16/2013   PLT 255 08/16/2013   Lab Results  Component Value Date   CREATININE 0.69 08/16/2013   BUN 11 08/16/2013   NA 137 08/16/2013   K 3.5 08/16/2013   CL 102 08/16/2013   CO2 27 08/16/2013    Lab Results  Component Value Date   HGBA1C 5.1 08/16/2013   Lipid Panel     Component Value Date/Time   CHOL 175 08/16/2013 1727   TRIG 239* 08/16/2013 1727   HDL 35* 08/16/2013 1727   CHOLHDL 5.0 08/16/2013 1727   VLDL 48* 08/16/2013 1727   LDLCALC 92 08/16/2013 1727       Assessment and plan:   Patient Active Problem List   Diagnosis Date Noted  . Annual physical exam 08/16/2013  . VIRAL INFECTION 07/25/2010  . ALLERGIC RHINITIS 04/01/2008  . BURSITIS, LEFT KNEE 04/01/2008  . HERPES LABIALIS 05/22/2007  . PHARYNGITIS 05/22/2007   Acute bronchitis/reactive airway disease following pneumonia Patient will be prescribed Flonase, albuterol as needed, for possibility of postinfectious reactive Start the patient on Claritin, Robitussin with codeine Because the patient has no fever do not think repeat chest x-rays needed  Patient will followup in 2 weeks if needed otherwise followup in 3 months      The patient was given clear instructions to go to ER or return to medical center if symptoms don't improve, worsen or new problems develop. The patient verbalized understanding. The patient was told to call to get any lab results if not heard anything in the next week.

## 2013-10-16 NOTE — Progress Notes (Signed)
Pt is here following up on his pneumonia.  Pt states that he is feeling much better but he is still having frequent headaches and he is coughing up mucus.

## 2013-11-15 ENCOUNTER — Emergency Department (HOSPITAL_COMMUNITY): Payer: Medicaid Other

## 2013-11-15 ENCOUNTER — Encounter (HOSPITAL_COMMUNITY): Payer: Self-pay | Admitting: Emergency Medicine

## 2013-11-15 ENCOUNTER — Inpatient Hospital Stay (HOSPITAL_COMMUNITY)
Admission: EM | Admit: 2013-11-15 | Discharge: 2013-11-21 | DRG: 830 | Disposition: A | Discharge status: Home / Self Care | Payer: Medicaid Other | Attending: Internal Medicine | Admitting: Internal Medicine

## 2013-11-15 ENCOUNTER — Emergency Department (INDEPENDENT_AMBULATORY_CARE_PROVIDER_SITE_OTHER): Payer: No Typology Code available for payment source

## 2013-11-15 ENCOUNTER — Other Ambulatory Visit: Payer: Self-pay

## 2013-11-15 ENCOUNTER — Emergency Department (HOSPITAL_COMMUNITY)
Admission: EM | Admit: 2013-11-15 | Discharge: 2013-11-15 | Disposition: A | Discharge status: Nursing Home (Includes ICF) | Payer: No Typology Code available for payment source | Source: Home / Self Care | Attending: Family Medicine | Admitting: Family Medicine

## 2013-11-15 DIAGNOSIS — M899 Disorder of bone, unspecified: Secondary | ICD-10-CM

## 2013-11-15 DIAGNOSIS — R634 Abnormal weight loss: Secondary | ICD-10-CM

## 2013-11-15 DIAGNOSIS — R739 Hyperglycemia, unspecified: Secondary | ICD-10-CM | POA: Diagnosis present

## 2013-11-15 DIAGNOSIS — E785 Hyperlipidemia, unspecified: Secondary | ICD-10-CM | POA: Diagnosis present

## 2013-11-15 DIAGNOSIS — J189 Pneumonia, unspecified organism: Secondary | ICD-10-CM

## 2013-11-15 DIAGNOSIS — R61 Generalized hyperhidrosis: Secondary | ICD-10-CM

## 2013-11-15 DIAGNOSIS — R053 Chronic cough: Secondary | ICD-10-CM | POA: Diagnosis not present

## 2013-11-15 DIAGNOSIS — R590 Localized enlarged lymph nodes: Secondary | ICD-10-CM | POA: Diagnosis present

## 2013-11-15 DIAGNOSIS — R918 Other nonspecific abnormal finding of lung field: Secondary | ICD-10-CM

## 2013-11-15 DIAGNOSIS — E876 Hypokalemia: Secondary | ICD-10-CM | POA: Diagnosis present

## 2013-11-15 DIAGNOSIS — R059 Cough, unspecified: Secondary | ICD-10-CM

## 2013-11-15 DIAGNOSIS — C801 Malignant (primary) neoplasm, unspecified: Principal | ICD-10-CM

## 2013-11-15 DIAGNOSIS — R05 Cough: Secondary | ICD-10-CM

## 2013-11-15 DIAGNOSIS — J309 Allergic rhinitis, unspecified: Secondary | ICD-10-CM | POA: Diagnosis present

## 2013-11-15 HISTORY — DX: Pneumonia, unspecified organism: J18.9

## 2013-11-15 HISTORY — DX: Malignant (primary) neoplasm, unspecified: C80.1

## 2013-11-15 LAB — CBC WITH DIFFERENTIAL/PLATELET
HEMATOCRIT: 41.9 % (ref 39.0–52.0)
HEMOGLOBIN: 15.5 g/dL (ref 13.0–17.0)
MCH: 30.6 pg (ref 26.0–34.0)
MCHC: 37 g/dL — AB (ref 30.0–36.0)
MCV: 82.6 fL (ref 78.0–100.0)
Platelets: 240 10*3/uL (ref 150–400)
RBC: 5.07 MIL/uL (ref 4.22–5.81)
RDW: 11.9 % (ref 11.5–15.5)
WBC: 8.1 10*3/uL (ref 4.0–10.5)

## 2013-11-15 LAB — BASIC METABOLIC PANEL
BUN: 12 mg/dL (ref 6–23)
CHLORIDE: 103 meq/L (ref 96–112)
CO2: 23 mEq/L (ref 19–32)
CREATININE: 0.72 mg/dL (ref 0.50–1.35)
Calcium: 8.9 mg/dL (ref 8.4–10.5)
GFR calc Af Amer: >90 mL/min (ref >90)
GFR calc non Af Amer: >90 mL/min (ref >90)
GLUCOSE: 124 mg/dL — AB (ref 70–99)
POTASSIUM: 3.2 meq/L — AB (ref 3.7–5.3)
Sodium: 140 mEq/L (ref 137–147)

## 2013-11-15 LAB — CBC
HEMATOCRIT: 43.4 % (ref 39.0–52.0)
HEMOGLOBIN: 16 g/dL (ref 13.0–17.0)
MCH: 30.8 pg (ref 26.0–34.0)
MCHC: 36.9 g/dL — AB (ref 30.0–36.0)
MCV: 83.5 fL (ref 78.0–100.0)
Platelets: 231 10*3/uL (ref 150–400)
RBC: 5.2 MIL/uL (ref 4.22–5.81)
RDW: 11.9 % (ref 11.5–15.5)
WBC: 6.3 10*3/uL (ref 4.0–10.5)

## 2013-11-15 MED ORDER — IPRATROPIUM BROMIDE 0.02 % IN SOLN
0.5000 mg | Freq: Once | RESPIRATORY_TRACT | Status: AC
  Administered 2013-11-15: 0.5 mg via RESPIRATORY_TRACT

## 2013-11-15 MED ORDER — POTASSIUM CHLORIDE CRYS ER 20 MEQ PO TBCR
20.0000 meq | EXTENDED_RELEASE_TABLET | Freq: Once | ORAL | Status: AC
  Administered 2013-11-15: 20 meq via ORAL
  Filled 2013-11-15: qty 1

## 2013-11-15 MED ORDER — IOHEXOL 300 MG/ML  SOLN
75.0000 mL | Freq: Once | INTRAMUSCULAR | Status: AC | PRN
  Administered 2013-11-15: 75 mL via INTRAVENOUS

## 2013-11-15 MED ORDER — ALBUTEROL SULFATE (2.5 MG/3ML) 0.083% IN NEBU
INHALATION_SOLUTION | RESPIRATORY_TRACT | Status: AC
  Filled 2013-11-15: qty 3

## 2013-11-15 MED ORDER — ALBUTEROL SULFATE (2.5 MG/3ML) 0.083% IN NEBU
5.0000 mg | INHALATION_SOLUTION | Freq: Once | RESPIRATORY_TRACT | Status: AC
  Administered 2013-11-15: 5 mg via RESPIRATORY_TRACT

## 2013-11-15 MED ORDER — IPRATROPIUM-ALBUTEROL 0.5-2.5 (3) MG/3ML IN SOLN
RESPIRATORY_TRACT | Status: AC
  Filled 2013-11-15: qty 3

## 2013-11-15 NOTE — ED Notes (Signed)
Patient transported to CT 

## 2013-11-15 NOTE — ED Provider Notes (Signed)
CSN: [id number]     Arrival date & time 11/15/13  1557 History   First MD Initiated Contact with Patient 11/15/13 1658     Chief Complaint  Patient presents with  . Chest Pain   (Consider location/radiation/quality/duration/timing/severity/associated sxs/prior Treatment)  HPI  The patient is a 43 year old male presenting today with reports of continuous chest pain that radiates through to his back for the past week or so. The patient was seen and diagnosed with a left middle and lower lobe pneumonia on 10/01/2013. The patient states that his discomfort is reproducible with cough and is sharp and "grabbing" in nature.  Patient states he needs to sit up to sleep and is unable to recline do to coughing and shortness of breath. Patient states he completed a full 7 days of antibiotic treatment with initial improvement which has subsequently worsened over the following week. Patient reports positive nausea but denies vomiting in addition reports reports chills and sweats this past Friday.  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 She states he does use the albuterol inhaler that was prescribed for him at last visit and that it does help with his cough, however states has not used it more than 5-6 times a week.  Past Medical History  Diagnosis Date  . High cholesterol   . Pneumonia    Past Surgical History  Procedure Laterality Date  . Carpal tunner    . Tendon repair     Family History  Problem Relation Age of Onset  . Stroke Father    History  Substance Use Topics  . Smoking status: Never Smoker   . Smokeless tobacco: Not on file  . Alcohol Use: No    Review of Systems  Constitutional: Positive for fever, chills and fatigue.  HENT: Negative.  Negative for congestion, sinus pressure and sore throat.   Eyes: Negative.   Respiratory: Positive for cough, shortness of breath and wheezing. Negative for choking and chest tightness.       See HPI.  Patient denies history of asthma, however states was told to be further evaluated and worked up for wheezing once this pneumonia resolved.  Cardiovascular: Positive for chest pain. Negative for palpitations and leg swelling.  Gastrointestinal: Negative.   Endocrine: Negative.   Genitourinary: Negative.   Musculoskeletal: Negative.   Skin: Negative.  Negative for color change, pallor and rash.  Allergic/Immunologic: Negative.   Neurological: Negative.   Hematological: Negative.   Psychiatric/Behavioral: Negative.     Allergies  Amoxicillin-pot clavulanate and Penicillins  Home Medications   Current Outpatient Rx  Name  Route  Sig  Dispense  Refill  . acetaminophen (TYLENOL) 325 MG tablet   Oral   Take 650 mg by mouth every 6 (six) hours as needed.         Marland Kitchen albuterol (PROVENTIL HFA;VENTOLIN HFA) 108 (90 BASE) MCG/ACT inhaler   Inhalation   Inhale 2 puffs into the lungs every 6 (six) hours as needed for wheezing or shortness of breath.   1 Inhaler   0   . amitriptyline (ELAVIL) 25 MG tablet   Oral   Take 1 tablet (25 mg total) by mouth at bedtime as needed for pain.   30 tablet   1   . fluticasone (FLONASE) 50 MCG/ACT nasal spray   Each Nare   Place 2 sprays into both nostrils daily.   16 g   6   . guaiFENesin-codeine 100-10  MG/5ML syrup   Oral   Take 10 mLs by mouth every 6 (six) hours as needed for cough.   500 mL   1   . loratadine (CLARITIN) 10 MG tablet   Oral   Take 1 tablet (10 mg total) by mouth daily.   30 tablet   11   . traMADol (ULTRAM) 50 MG tablet   Oral   Take 1 tablet (50 mg total) by mouth every 6 (six) hours as needed for pain.   50 tablet   0    BP 131/84  Pulse 70  Temp(Src) 98.7 F (37.1 C) (Oral)  Resp 18  SpO2 100%  Physical Exam  Nursing note and vitals reviewed. Constitutional: He is oriented to person, place, and time. He appears well-developed and well-nourished. No distress.  Pulmonary/Chest: Effort  normal. No respiratory distress. He has wheezes. He has no rales.   He exhibits tenderness.  The patient is unable to fail without coughing upon deep breathing. In addition E to A changes noted with egophony in left lower lobe.  Neurological: He is alert and oriented to person, place, and time.  Skin: Skin is warm and dry. No rash noted. He is not diaphoretic. No erythema. No pallor.    Patient reevaluated following hand-held nebulizer treatment and patient reports improved breathing with decreased coughing upon expiration.    ED Course  Procedures (including critical care time) Labs Review Labs Reviewed - No data to display Imaging Review Dg Chest 2 View  11/15/2013   CLINICAL DATA:  Chest pain with cough. Recent diagnosis of pneumonia.  EXAM: CHEST  2 VIEW  COMPARISON:  10/01/2013  FINDINGS: Consolidation persists, predominantly in the left upper lobe lateral to the left hilum with some infiltrate also evident in the left lower lobe.  There are numerous bilateral small ill-defined pulmonary nodules. These are more prominent than on the prior study. No other areas of lung consolidation. No pleural effusion or pneumothorax.  Cardiac silhouette is normal in size. Left hilum is partly silhouetted with the left upper lobe consolidation normal mediastinal and right hilar contours.  Bony thorax is intact.  IMPRESSION: 1. Persistent consolidation extending laterally from the left hilum predominantly in the left upper lobe with some left lower lobe consolidation. 2. Multiple small pulmonary nodules which a more prominent than they did on the prior exam. 3. The persistence of the left sided consolidation, along with the mild increase in multiple ill-defined small pulmonary nodules suggests an atypical infection. Consider a fungal etiology. A followup contrast enhanced CT may be helpful.   Electronically Signed   By: Lajean Manes M.D.   On: 11/15/2013 18:04     MDM   1. Pneumonia    Meds ordered this  encounter  Medications  . albuterol (PROVENTIL) (2.5 MG/3ML) 0.083% nebulizer solution 5 mg    Sig:    And  . ipratropium (ATROVENT) nebulizer solution 0.5 mg    Sig:      Dr. Juventino Slovak consulted on plan of care.  The patient transferred to [facility] ED for further workup and evaluation.  Urgent care records show patient was treated with IM Rocephin and 7 Day course of Avelox which he completed and no improvement/worsening of symptoms.        Jacqualyn Posey, NP 11/15/13 [place], NP 11/15/13 [phone]

## 2013-11-15 NOTE — H&P (Signed)
[facility] History and Physical  Patient: Chad George  ZOX:[id number]  DOB: 11-Dec-1970  DOS: the patient was seen and examined on 11/15/2013 PCP: Aurora Mask, NP  Chief Complaint: Cough  HPI: Chad George is a 43 y.o. male with Past medical history of dyslipidemia. The patient is coming from home. The patient presented with complaints of cough with greenish expectoration that is present since January 2015. He mentions earlier in January he presented with similar complaint and was treated with antibiotics for 7 days. He also completed her on another course of antibiotic for 5 days in February. He continues to have cough without any improvement in his symptoms along with complaints of right-sided back pain which was worsening in nature but initially it was worsening with cough and breathing. He denies any fever but complains of chills with diaphoresis. He complains of night sweats. He denies headache lumps or bumps anywhere and also mentions that he has intentionally tried to lose weight due to his increased cholesterol. 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 No other exposure, no history of active smoking, no alcohol abuse no drug abuse, no recent travel. He is from [place] but has not travel to [place] for more than 18 years. He denies any similar symptoms her prior pneumonias. He denies any history of testicular complains or congenital abnormalities of the testis.  Review of Systems: as mentioned in the history of present illness.  A Comprehensive review of the other systems is negative.  Past Medical History  Diagnosis Date  . High cholesterol   . Pneumonia    Past Surgical History  Procedure Laterality Date  . Carpal tunner    . Tendon repair     Social History:  reports that he has never smoked. He does not have any smokeless tobacco  history on file. He reports that he does not drink alcohol or use illicit drugs. Independent for most of his  ADL.  Allergies  Allergen Reactions  . Amoxicillin-Pot Clavulanate   . Penicillins     REACTION: RASH    Family History  Problem Relation Age of Onset  . Stroke Father     Prior to Admission medications   Medication Sig Start Date End Date Taking? Authorizing Provider  acetaminophen (TYLENOL) 325 MG tablet Take 650 mg by mouth every 6 (six) hours as needed.   Yes Historical Provider, MD  albuterol (PROVENTIL HFA;VENTOLIN HFA) 108 (90 BASE) MCG/ACT inhaler Inhale 2 puffs into the lungs every 6 (six) hours as needed for wheezing or shortness of breath. 10/16/13  Yes Reyne Dumas, MD  fluticasone (FLONASE) 50 MCG/ACT nasal spray Place 2 sprays into both nostrils daily. 10/16/13  Yes Reyne Dumas, MD  guaiFENesin-codeine 100-10 MG/5ML syrup Take 10 mLs by mouth every 6 (six) hours as needed for cough. 10/16/13  Yes Reyne Dumas, MD  loratadine (CLARITIN) 10 MG tablet Take 1 tablet (10 mg total) by mouth daily. 10/16/13  Yes Reyne Dumas, MD    Physical Exam: Filed Vitals:   11/15/13 2215 11/15/13 2230 11/15/13 2300 11/15/13 2345  BP: 110/69 117/72 119/79 122/82  Pulse: 77 78 79 94  Temp:      TempSrc:      Resp: 18 20 19 23   SpO2: 98% 96% 95% 96%    General: Alert, Awake and Oriented to Time, Place and Person. Appear in  mild distress Eyes: PERRL ENT: Oral Mucosa clear moist. Neck: No JVD Cardiovascular: S1 and S2 Present, no Murmur, Peripheral Pulses Present Respiratory: Bilateral Air entry equal and Decreased, occasional Crackles, nowheezes Abdomen: Bowel Sound Present, Soft and Non tender Skin: No Rash Extremities: No Pedal edema, no calf tenderness Neurologic: Grossly Unremarkable. Labs on Admission:  CBC:  Recent Labs Lab 11/15/13 1853 11/15/13 2112  WBC 6.3 8.1  HGB 16.0 15.5  HCT 43.4 41.9  MCV 83.5 82.6  PLT 231 240    CMP     Component Value Date/Time    NA 140 11/15/2013 1853   K 3.2* 11/15/2013 1853   CL 103 11/15/2013 1853   CO2 23 11/15/2013 1853   GLUCOSE 124* 11/15/2013 1853   BUN 12 11/15/2013 1853   CREATININE 0.72 11/15/2013 1853   CREATININE 0.69 08/16/2013 1727   CALCIUM 8.9 11/15/2013 1853   PROT 7.1 08/16/2013 1727   ALBUMIN 4.6 08/16/2013 1727   AST 16 08/16/2013 1727   ALT 17 08/16/2013 1727   ALKPHOS 78 08/16/2013 1727   BILITOT 0.6 08/16/2013 1727   GFRNONAA >90 11/15/2013 1853   GFRAA >90 11/15/2013 1853    No results found for this basename: LIPASE, AMYLASE,  in the last 168 hours No results found for this basename: AMMONIA,  in the last 168 hours  No results found for this basename: CKTOTAL, CKMB, CKMBINDEX, TROPONINI,  in the last 168 hours BNP (last 3 results) No results found for this basename: PROBNP,  in the last 8760 hours  Radiological Exams on Admission: Dg Chest 2 View  11/15/2013   CLINICAL DATA:  Chest pain with cough. Recent diagnosis of pneumonia.  EXAM: CHEST  2 VIEW  COMPARISON:  10/01/2013  FINDINGS: Consolidation persists, predominantly in the left upper lobe lateral to the left hilum with some infiltrate also evident in the left lower lobe.  There are numerous bilateral small ill-defined pulmonary nodules. These are more prominent than on the prior study. No other areas of lung consolidation. No pleural effusion or pneumothorax.  Cardiac silhouette is normal in size. Left hilum is partly silhouetted with the left upper lobe consolidation normal mediastinal and right hilar contours.  Bony thorax is intact.  IMPRESSION: 1. Persistent consolidation extending laterally from the left hilum predominantly in the left upper lobe with some left lower lobe consolidation. 2. Multiple small pulmonary nodules which a more prominent than they did on the prior exam. 3. The persistence of the left sided consolidation, along with the mild increase in multiple ill-defined small pulmonary nodules suggests an atypical infection. Consider a  fungal etiology. A followup contrast enhanced CT may be helpful.   Electronically Signed   By: Lajean Manes M.D.   On: 11/15/2013 18:04   Ct Chest W Contrast  11/15/2013   CLINICAL DATA:  Shortness of breath. Chest pain and back pain. Pulmonary infiltrate.  EXAM: CT CHEST WITH CONTRAST  TECHNIQUE: Multidetector CT imaging of the chest was performed during intravenous contrast administration.  CONTRAST:  46mL OMNIPAQUE IOHEXOL 300 MG/ML  SOLN  COMPARISON:  Chest x-rays dated 10/01/2013 and 11/15/2013  FINDINGS: The patient has innumerable tiny bilateral pulmonary nodules as well as an extensive poorly defined masslike lesion in in the left upper lobe extending into the lingula. There is extensive mediastinal adenopathy. Left periaortic node on image 20 of series 2 measures 18 mm. A lymph node anterior to the left hilum on image 25 of series 2 is also 18 mm. The masslike  area in the left upper lobe measures 5.4 x 4.7 x 3.7 cm.  Heart size and pulmonary vascularity are normal. No effusions. The visualized portion of the upper abdomen is normal. There is an 8 mm ovoid lucent lesion in the left posterior aspect of T9 vertebra, indeterminate.  The patient has extensive peribronchial thickening extending into the left lower lobe. This could be due to lymphatic obstruction or inflammation.  IMPRESSION: 1. Innumerable pulmonary nodules with a mass like ill-defined density in the left upper lobe as well as extensive mediastinal adenopathy. This could represent primary lung cancer with innumerable pulmonary metastases. Alternatively, this could represent an atypical infection such as coccidioidomycosis. 2. 8 mm lytic lesion in T9 which could represent metastatic disease but is indeterminate.   Electronically Signed   By: Rozetta Nunnery M.D.   On: 11/15/2013 22:07   Assessment/Plan Principal Problem:   Pulmonary nodules/lesions, multiple Active Problems:   Chronic cough   1. Pulmonary nodules/lesions, multiple The  patient is presenting with complaints of chronic cough that is present since last 2 months and has not improved with 2 courses of antibiotics for community-acquired organisms. His CT scan today he is showing multiple pulmonary nodules with possible primary left lung lesion as well as and like it lesion in the T9 vertebra. This is highly suspicious for primary malignancy. But also considering his history I would also rule out possible tuberculosis. His CT scan is also suspicious for a possible fungal infection. At present I have discussed the case with on-call infectious disease attending. And we both agree to rule out TB and keeping the patient in respiratory isolation. At present I will hold off on any antibiotics as per my discussion with infectious disease and off pain blood cultures, urine antigens, sputum cultures, AFB smear every 8 hours for further workup of this patient's etiology. Would also discuss the case with pulmonology for possible biopsy. At present I will treat him symptomatically with Mucinex and Tessalon Perles  Consults: Infectious disease and pulmonology  DVT Prophylaxis: subcutaneous Heparin Nutrition: Regular diet  Code Status: Full  Family Communication: Family was present at bedside, opportunity was given to ask question and all questions were answered satisfactorily at the time of interview. Disposition: Admitted to inpatient in med-surge unit.  Author: Berle Mull, MD [facility] Pager: [phone] 11/15/2013, 11:54 PM    If 7PM-7AM, please contact night-coverage [email or web address] Password [email or web address]

## 2013-11-15 NOTE — ED Notes (Signed)
Pt sent here from [facility] for further evaluation of results of chest xray. Pt dx with PNA a month ago and treated with PO abx and rocephin. Pt having worsening SOB and xray suspects fungal infection.

## 2013-11-15 NOTE — ED Provider Notes (Signed)
Medical screening examination/treatment/procedure(s) were performed by resident physician or non-physician practitioner and as supervising physician I was immediately available for consultation/collaboration.   Pauline Good MD.   Billy Fischer, MD 11/15/13 2053

## 2013-11-15 NOTE — ED Notes (Signed)
Dr. Judithann Graves at the bedside.

## 2013-11-15 NOTE — ED Provider Notes (Signed)
CSN: [id number]     Arrival date & time 11/15/13  1842 History   First MD Initiated Contact with Patient 11/15/13 2045     Chief Complaint  Patient presents with  . Shortness of Breath     Patient is a 43 y.o. male presenting with cough.  Cough Cough characteristics:  Productive Sputum characteristics:  Green Severity:  Moderate Onset quality:  Gradual Duration:  9 weeks Timing:  Constant Progression:  Worsening Chronicity:  New Smoker: no   Relieved by:  Nothing Worsened by:  Nothing tried Ineffective treatments: antibiotics. Associated symptoms: chills and shortness of breath   Associated symptoms: no chest pain, no ear pain, no fever, no headaches, no rash, no rhinorrhea and no sore throat     Past Medical History  Diagnosis Date  . High cholesterol   . Pneumonia    Past Surgical History  Procedure Laterality Date  . Carpal tunner    . Tendon repair     Family History  Problem Relation Age of Onset  . Stroke Father    History  Substance Use Topics  . Smoking status: Never Smoker   . Smokeless tobacco: Not on file  . Alcohol Use: No    Review of Systems  Constitutional: Positive for chills and unexpected weight change. Negative for fever, activity change and appetite change.  HENT: Negative for congestion, ear pain, rhinorrhea, sinus pressure and sore throat.   Eyes: Negative for pain and redness.  Respiratory: Positive for cough and shortness of breath. Negative for chest tightness.   Cardiovascular: Negative for chest pain and palpitations.  Gastrointestinal: Negative for nausea, vomiting, abdominal pain, diarrhea and abdominal distention.  Genitourinary: Negative for dysuria, flank pain and difficulty urinating.  Musculoskeletal: Negative for back pain, neck pain and neck stiffness.  Skin: Negative for rash and wound.  Neurological: Negative for dizziness, light-headedness, numbness and headaches.  Hematological: Negative for adenopathy.   Psychiatric/Behavioral: Negative for behavioral problems, confusion and agitation.      Allergies  Amoxicillin-pot clavulanate and Penicillins  Home Medications   Current Outpatient Rx  Name  Route  Sig  Dispense  Refill  . acetaminophen (TYLENOL) 325 MG tablet   Oral   Take 650 mg by mouth every 6 (six) hours as needed.         Marland Kitchen albuterol (PROVENTIL HFA;VENTOLIN HFA) 108 (90 BASE) MCG/ACT inhaler   Inhalation   Inhale 2 puffs into the lungs every 6 (six) hours as needed for wheezing or shortness of breath.   1 Inhaler   0   . fluticasone (FLONASE) 50 MCG/ACT nasal spray   Each Nare   Place 2 sprays into both nostrils daily.   16 g   6   . guaiFENesin-codeine 100-10 MG/5ML syrup   Oral   Take 10 mLs by mouth every 6 (six) hours as needed for cough.   500 mL   1   . loratadine (CLARITIN) 10 MG tablet   Oral   Take 1 tablet (10 mg total) by mouth daily.   30 tablet   11    BP 122/75  Pulse 88  Temp(Src) 98.5 F (36.9 C) (Oral)  Resp 19  SpO2 99% Physical Exam  Constitutional: He is oriented to person, place, and time. He appears well-developed and well-nourished. No distress.  HENT:  Head: Normocephalic and atraumatic.  Nose: Nose normal.  Mouth/Throat: Oropharynx is clear and moist.  Eyes: Conjunctivae and EOM are normal. Pupils are equal, round, and reactive to  light.  Neck: Normal range of motion. Neck supple. No tracheal deviation present.  Cardiovascular: Normal rate, regular rhythm, normal heart sounds and intact distal pulses.   Pulmonary/Chest: Effort normal. No respiratory distress. He has rales ( R sided).  Abdominal: Soft. Bowel sounds are normal. He exhibits no distension. There is no tenderness. There is no rebound and no guarding.  Musculoskeletal: Normal range of motion. He exhibits no edema and no tenderness.  Neurological: He is alert and oriented to person, place, and time.  Skin: Skin is warm and dry.  Psychiatric: He has a normal  mood and affect. His behavior is normal.    ED Course  Procedures (including critical care time) Labs Review Labs Reviewed  CBC - Abnormal; Notable for the following:    MCHC 36.9 (*)    All other components within normal limits  BASIC METABOLIC PANEL - Abnormal; Notable for the following:    Potassium 3.2 (*)    Glucose, Bld 124 (*)    All other components within normal limits  CBC WITH DIFFERENTIAL - Abnormal; Notable for the following:    MCHC 37.0 (*)    All other components within normal limits   Imaging Review Dg Chest 2 View  11/15/2013   CLINICAL DATA:  Chest pain with cough. Recent diagnosis of pneumonia.  EXAM: CHEST  2 VIEW  COMPARISON:  10/01/2013  FINDINGS: Consolidation persists, predominantly in the left upper lobe lateral to the left hilum with some infiltrate also evident in the left lower lobe.  There are numerous bilateral small ill-defined pulmonary nodules. These are more prominent than on the prior study. No other areas of lung consolidation. No pleural effusion or pneumothorax.  Cardiac silhouette is normal in size. Left hilum is partly silhouetted with the left upper lobe consolidation normal mediastinal and right hilar contours.  Bony thorax is intact.  IMPRESSION: 1. Persistent consolidation extending laterally from the left hilum predominantly in the left upper lobe with some left lower lobe consolidation. 2. Multiple small pulmonary nodules which a more prominent than they did on the prior exam. 3. The persistence of the left sided consolidation, along with the mild increase in multiple ill-defined small pulmonary nodules suggests an atypical infection. Consider a fungal etiology. A followup contrast enhanced CT may be helpful.   Electronically Signed   By: Lajean Manes M.D.   On: 11/15/2013 18:04   Ct Chest W Contrast  11/15/2013   CLINICAL DATA:  Shortness of breath. Chest pain and back pain. Pulmonary infiltrate.  EXAM: CT CHEST WITH CONTRAST  TECHNIQUE:  Multidetector CT imaging of the chest was performed during intravenous contrast administration.  CONTRAST:  92mL OMNIPAQUE IOHEXOL 300 MG/ML  SOLN  COMPARISON:  Chest x-rays dated 10/01/2013 and 11/15/2013  FINDINGS: The patient has innumerable tiny bilateral pulmonary nodules as well as an extensive poorly defined masslike lesion in in the left upper lobe extending into the lingula. There is extensive mediastinal adenopathy. Left periaortic node on image 20 of series 2 measures 18 mm. A lymph node anterior to the left hilum on image 25 of series 2 is also 18 mm. The masslike area in the left upper lobe measures 5.4 x 4.7 x 3.7 cm.  Heart size and pulmonary vascularity are normal. No effusions. The visualized portion of the upper abdomen is normal. There is an 8 mm ovoid lucent lesion in the left posterior aspect of T9 vertebra, indeterminate.  The patient has extensive peribronchial thickening extending into the left lower lobe.  This could be due to lymphatic obstruction or inflammation.  IMPRESSION: 1. Innumerable pulmonary nodules with a mass like ill-defined density in the left upper lobe as well as extensive mediastinal adenopathy. This could represent primary lung cancer with innumerable pulmonary metastases. Alternatively, this could represent an atypical infection such as coccidioidomycosis. 2. 8 mm lytic lesion in T9 which could represent metastatic disease but is indeterminate.   Electronically Signed   By: Rozetta Nunnery M.D.   On: 11/15/2013 22:07     MDM   Final diagnoses:  Cough  Night sweats  Weight loss    43 yo M with hx of HLD from [place] who has lived in the [place] for 18 years presents with 2 months of continuous cough productive of green sputum. He also endorses intermittent night sweats. Sxs began in mid Jan with 4 days of fever. He was initially diagnosed with CAP and sent home on abx. Since then cough has persisted. No improved and not worse. He now has developed pleuritc chest pain  which he attributes to the constat cough. Patient was seen at [facility] today and CXR was concerning for fungal infection. Will obtain CT chest. Case discussed with my attending Dr. Kathrynn Humble.   10:20 PM CT  Chest with "Innumerable pulmonary nodules with a mass like ill-defined density in the left upper lobe as well as extensive mediastinal adenopathy. This could represent primary lung cancer with innumerable pulmonary metastases. Alternatively, this could represent an atypical infection such as coccidioidomycosis. "   Hospitalist on call paged.  Patient up dated of findings.   11:17 PM Patient admitted for further workup. Placed on droplet precautions in ED.     Ruthell Rummage, MD 11/15/13 [phone]

## 2013-11-15 NOTE — ED Notes (Signed)
Patient returned from CT

## 2013-11-15 NOTE — ED Notes (Signed)
Patient complains of chest pain and back pain.  Patient was diagnosed with pneumonia (1/23).  Patient followed up with pcp on (2/7).  pcp did not think follow up chest xray needed at that time.

## 2013-11-15 NOTE — ED Notes (Signed)
Coughing up green phlegm, pain in left chest

## 2013-11-16 ENCOUNTER — Encounter (HOSPITAL_COMMUNITY): Payer: Self-pay | Admitting: *Deleted

## 2013-11-16 DIAGNOSIS — R222 Localized swelling, mass and lump, trunk: Secondary | ICD-10-CM

## 2013-11-16 DIAGNOSIS — R053 Chronic cough: Secondary | ICD-10-CM | POA: Diagnosis not present

## 2013-11-16 DIAGNOSIS — R739 Hyperglycemia, unspecified: Secondary | ICD-10-CM | POA: Diagnosis present

## 2013-11-16 DIAGNOSIS — R918 Other nonspecific abnormal finding of lung field: Secondary | ICD-10-CM | POA: Diagnosis present

## 2013-11-16 DIAGNOSIS — R61 Generalized hyperhidrosis: Secondary | ICD-10-CM

## 2013-11-16 DIAGNOSIS — R05 Cough: Secondary | ICD-10-CM | POA: Diagnosis not present

## 2013-11-16 DIAGNOSIS — E785 Hyperlipidemia, unspecified: Secondary | ICD-10-CM | POA: Diagnosis present

## 2013-11-16 LAB — STREP PNEUMONIAE URINARY ANTIGEN: Strep Pneumo Urinary Antigen: NEGATIVE

## 2013-11-16 LAB — EXPECTORATED SPUTUM ASSESSMENT W GRAM STAIN, RFLX TO RESP C

## 2013-11-16 LAB — CBC WITH DIFFERENTIAL/PLATELET
Basophils Absolute: 0 10*3/uL (ref 0.0–0.1)
Basophils Relative: 0 % (ref 0–1)
Eosinophils Absolute: 0.2 10*3/uL (ref 0.0–0.7)
Eosinophils Relative: 4 % (ref 0–5)
HCT: 40 % (ref 39.0–52.0)
Hemoglobin: 14.7 g/dL (ref 13.0–17.0)
Lymphocytes Relative: 28 % (ref 12–46)
Lymphs Abs: 1.4 10*3/uL (ref 0.7–4.0)
MCH: 30.6 pg (ref 26.0–34.0)
MCHC: 36.8 g/dL — ABNORMAL HIGH (ref 30.0–36.0)
MCV: 83.2 fL (ref 78.0–100.0)
Monocytes Absolute: 0.4 10*3/uL (ref 0.1–1.0)
Monocytes Relative: 7 % (ref 3–12)
Neutro Abs: 3.1 10*3/uL (ref 1.7–7.7)
Neutrophils Relative %: 61 % (ref 43–77)
Platelets: 233 10*3/uL (ref 150–400)
RBC: 4.81 MIL/uL (ref 4.22–5.81)
RDW: 11.9 % (ref 11.5–15.5)
WBC: 5.1 10*3/uL (ref 4.0–10.5)

## 2013-11-16 LAB — COMPREHENSIVE METABOLIC PANEL WITH GFR
ALT: 17 U/L (ref 0–53)
AST: 18 U/L (ref 0–37)
Albumin: 3.7 g/dL (ref 3.5–5.2)
Alkaline Phosphatase: 124 U/L — ABNORMAL HIGH (ref 39–117)
BUN: 11 mg/dL (ref 6–23)
CO2: 22 meq/L (ref 19–32)
Calcium: 9 mg/dL (ref 8.4–10.5)
Chloride: 105 meq/L (ref 96–112)
Creatinine, Ser: 0.69 mg/dL (ref 0.50–1.35)
GFR calc Af Amer: >90 mL/min
GFR calc non Af Amer: >90 mL/min
Glucose, Bld: 106 mg/dL — ABNORMAL HIGH (ref 70–99)
Potassium: 3.9 meq/L (ref 3.7–5.3)
Sodium: 142 meq/L (ref 137–147)
Total Bilirubin: 0.6 mg/dL (ref 0.3–1.2)
Total Protein: 6.4 g/dL (ref 6.0–8.3)

## 2013-11-16 LAB — HIV ANTIBODY (ROUTINE TESTING W REFLEX): HIV: NONREACTIVE

## 2013-11-16 LAB — PROTIME-INR
INR: 1.09 (ref 0.00–1.49)
Prothrombin Time: 13.9 s (ref 11.6–15.2)

## 2013-11-16 LAB — EXPECTORATED SPUTUM ASSESSMENT W REFEX TO RESP CULTURE

## 2013-11-16 MED ORDER — LIDOCAINE HCL 2 % EX GEL
Freq: Once | CUTANEOUS | Status: DC
  Filled 2013-11-16: qty 5

## 2013-11-16 MED ORDER — PNEUMOCOCCAL VAC POLYVALENT 25 MCG/0.5ML IJ INJ
0.5000 mL | INJECTION | INTRAMUSCULAR | Status: AC
  Administered 2013-11-17: 0.5 mL via INTRAMUSCULAR
  Filled 2013-11-16: qty 0.5

## 2013-11-16 MED ORDER — INFLUENZA VAC SPLIT QUAD 0.5 ML IM SUSP
0.5000 mL | INTRAMUSCULAR | Status: AC
  Administered 2013-11-17: 0.5 mL via INTRAMUSCULAR
  Filled 2013-11-16: qty 0.5

## 2013-11-16 MED ORDER — BUTAMBEN-TETRACAINE-BENZOCAINE 2-2-14 % EX AERO
1.0000 | INHALATION_SPRAY | Freq: Once | CUTANEOUS | Status: DC
  Filled 2013-11-16: qty 56

## 2013-11-16 MED ORDER — HEPARIN SODIUM (PORCINE) 5000 UNIT/ML IJ SOLN
5000.0000 [IU] | Freq: Three times a day (TID) | INTRAMUSCULAR | Status: DC
  Administered 2013-11-16 – 2013-11-18 (×5): 5000 [IU] via SUBCUTANEOUS
  Filled 2013-11-16 (×10): qty 1

## 2013-11-16 MED ORDER — OXYCODONE HCL 5 MG PO TABS
5.0000 mg | ORAL_TABLET | Freq: Four times a day (QID) | ORAL | Status: DC | PRN
  Administered 2013-11-16: 5 mg via ORAL
  Administered 2013-11-16: 10 mg via ORAL
  Administered 2013-11-17 – 2013-11-20 (×6): 5 mg via ORAL
  Filled 2013-11-16 (×5): qty 1
  Filled 2013-11-16: qty 2
  Filled 2013-11-16 (×2): qty 1

## 2013-11-16 MED ORDER — BENZONATATE 100 MG PO CAPS
100.0000 mg | ORAL_CAPSULE | Freq: Three times a day (TID) | ORAL | Status: DC | PRN
  Administered 2013-11-19 – 2013-11-21 (×2): 100 mg via ORAL
  Filled 2013-11-16 (×2): qty 1

## 2013-11-16 MED ORDER — PHENYLEPHRINE HCL 0.25 % NA SOLN
1.0000 | Freq: Four times a day (QID) | NASAL | Status: DC | PRN
  Filled 2013-11-16: qty 15

## 2013-11-16 MED ORDER — HYDROMORPHONE HCL PF 1 MG/ML IJ SOLN
0.5000 mg | INTRAMUSCULAR | Status: DC | PRN
  Administered 2013-11-17: 0.5 mg via INTRAVENOUS
  Filled 2013-11-16: qty 1

## 2013-11-16 MED ORDER — GUAIFENESIN ER 600 MG PO TB12
600.0000 mg | ORAL_TABLET | Freq: Two times a day (BID) | ORAL | Status: DC
  Administered 2013-11-16 – 2013-11-21 (×10): 600 mg via ORAL
  Filled 2013-11-16 (×13): qty 1

## 2013-11-16 NOTE — Progress Notes (Signed)
Patient is aware of the need for a sputum culture, but patient states he has not been coughing at all since this morning. Patient scheduled for a bronchoscopy tomorrow 3/11 - Dr. Megan Salon states they will get their sputum results from that. Will monitor.  Aaron Edelman, Rohith Fauth [place]

## 2013-11-16 NOTE — Consult Note (Signed)
PULMONARY / CRITICAL CARE MEDICINE   Name: Chad George MRN: [id number] DOB: 08/22/1971    ADMISSION DATE:  11/15/2013 CONSULTATION DATE:  11/16/2013  REFERRING MD :  Posey Pronto PRIMARY SERVICE: TRH  CHIEF COMPLAINT:  Cough  BRIEF PATIENT DESCRIPTION: 43 y/o male was admitted on 3/9 to [facility] with several weeks of cough and green sputum production.  He was found to have a left upper lung mass with innumerable pulmonary nodules.  SIGNIFICANT EVENTS / STUDIES:  3/9 CT chest > mass like lesion left upper lobe extending into lingula with innumerable tiny bilateral pulmonary nodules and mediastinal lymphadenopathy; T9 lytic lesion noted  LINES / TUBES:   CULTURES: 3/10 blood >> 3/10 sputum >> 3/10 sputum AFB >>  ANTIBIOTICS:   HISTORY OF PRESENT ILLNESS:  43 y/o male was admitted on 3/9 to [facility] with several weeks of cough and green sputum production.  He was found to have a left upper lung mass with innumerable pulmonary nodules. He says that he developed the cough with green sputum production in January and was treated for one week with ceftriaxone x1 and moxifloxacin for a week on January 23.  He says that he felt like the cough improved somewhat initially, but throughout February he noticed increasing cough, mucus production (green), and dyspnea.  He had ocassional mild sweats at night (just forehead) and rare chills but doesn't recall a fever or rigors.  His wife has been sick in the last few days.  He works in a Air traffic controller.  He has lost weight intentionally due to high cholesterol.  He has not noticed any testicular lesions or masses.  PAST MEDICAL HISTORY :  Past Medical History  Diagnosis Date  . High cholesterol   . Pneumonia    Past Surgical History  Procedure Laterality Date  . Carpal tunner    . Tendon repair     Prior to Admission medications   Medication Sig Start Date End Date Taking? Authorizing Provider  acetaminophen (TYLENOL) 325 MG tablet Take 650 mg by  mouth every 6 (six) hours as needed.   Yes Historical Provider, MD  albuterol (PROVENTIL HFA;VENTOLIN HFA) 108 (90 BASE) MCG/ACT inhaler Inhale 2 puffs into the lungs every 6 (six) hours as needed for wheezing or shortness of breath. 10/16/13  Yes Reyne Dumas, MD  fluticasone (FLONASE) 50 MCG/ACT nasal spray Place 2 sprays into both nostrils daily. 10/16/13  Yes Reyne Dumas, MD  guaiFENesin-codeine 100-10 MG/5ML syrup Take 10 mLs by mouth every 6 (six) hours as needed for cough. 10/16/13  Yes Reyne Dumas, MD  loratadine (CLARITIN) 10 MG tablet Take 1 tablet (10 mg total) by mouth daily. 10/16/13  Yes Reyne Dumas, MD   Allergies  Allergen Reactions  . Amoxicillin-Pot Clavulanate   . Penicillins     REACTION: RASH    FAMILY HISTORY:  Family History  Problem Relation Age of Onset  . Stroke Father    SOCIAL HISTORY:  reports that he has never smoked. He does not have any smokeless tobacco history on file. He reports that he does not drink alcohol or use illicit drugs.  REVIEW OF SYSTEMS:   Gen: Denies fever, + chills, + weight change, fatigue, + night sweats HEENT: Denies blurred vision, double vision, hearing loss, tinnitus, sinus congestion, rhinorrhea, sore throat, neck stiffness, dysphagia PULM: per HPI CV: Denies chest pain, edema, orthopnea, paroxysmal nocturnal dyspnea, palpitations GI: Denies abdominal pain, nausea, vomiting, diarrhea, hematochezia, melena, constipation, change in bowel habits GU: Denies dysuria, hematuria,  polyuria, oliguria, urethral discharge Endocrine: Denies hot or cold intolerance, polyuria, polyphagia or appetite change Derm: Denies rash, dry skin, scaling or peeling skin change Heme: Denies easy bruising, bleeding, bleeding gums Neuro: Denies headache, numbness, weakness, slurred speech, loss of memory or consciousness   SUBJECTIVE:   VITAL SIGNS: Temp:  [98.4 F (36.9 C)-98.7 F (37.1 C)] 98.4 F (36.9 C) (03/10 0424) Pulse Rate:  [70-105] 89 (03/10  0424) Resp:  [18-23] 18 (03/10 0424) BP: (110-141)/(60-84) 111/66 mmHg (03/10 0424) SpO2:  [95 %-100 %] 99 % (03/10 0424) Weight:  [73.8 kg (162 lb 11.2 oz)] 73.8 kg (162 lb 11.2 oz) (03/10 0026) HEMODYNAMICS:   VENTILATOR SETTINGS:   INTAKE / OUTPUT: Intake/Output     03/09 0701 - 03/10 0700 03/10 0701 - 03/11 0700   P.O. 480    Total Intake(mL/kg) 480 (6.5)    Urine (mL/kg/hr) 350    Total Output 350     Net +130            PHYSICAL EXAMINATION:  Gen: well appearing, no acute distress HEENT: NCAT, PERRL, EOMi, OP clear, neck supple without masses PULM: Crackles left lateral with a wheeze, few crackles R base otherwise clear CV: RRR, no mgr, no JVD AB: BS+, soft, nontender, no hsm Ext: warm, no edema, no clubbing, no cyanosis Derm: no rash or skin breakdown Neuro: A&Ox4, CN II-XII intact, strength 5/5 in all 4 extremities   LABS:  CBC  Recent Labs Lab 11/15/13 1853 11/15/13 2112  WBC 6.3 8.1  HGB 16.0 15.5  HCT 43.4 41.9  PLT 231 240   Coag's No results found for this basename: APTT, INR,  in the last 168 hours BMET  Recent Labs Lab 11/15/13 1853  NA 140  K 3.2*  CL 103  CO2 23  BUN 12  CREATININE 0.72  GLUCOSE 124*   Electrolytes  Recent Labs Lab 11/15/13 1853  CALCIUM 8.9   Sepsis Markers No results found for this basename: LATICACIDVEN, PROCALCITON, O2SATVEN,  in the last 168 hours ABG No results found for this basename: PHART, PCO2ART, PO2ART,  in the last 168 hours Liver Enzymes No results found for this basename: AST, ALT, ALKPHOS, BILITOT, ALBUMIN,  in the last 168 hours Cardiac Enzymes No results found for this basename: TROPONINI, PROBNP,  in the last 168 hours Glucose No results found for this basename: GLUCAP,  in the last 168 hours  Imaging    CXR: left lung consolidation with innumerable pulm nodules  ASSESSMENT / PLAN:  PULMONARY A: Subacute cough with mucus production and a left upper lobe consolidation vs mass and  innumerable pulmonary nodules.  I am most concerned about possible malignancy (testicular vs primary lung) vs an atypical infection like a fungal infection vs less likely sarcoid.  Radiographically this does not look like TB to me, but I value our ID colleagues opinion here. P:   -plan for bronchoscopy 3/10 with fluor and TB precautions -will perform TBBX, brushing and BAL -testicular exam   INFECTIOUS A:  Post obstructive pneumonia vs atypical infection? See above P:   -agree with holding antibiotics for now -ID consult  Jillyn Hidden PCCM Pager: [phone] Cell: [phone] If no response, call [phone]  11/16/2013, 7:05 AM

## 2013-11-16 NOTE — ED Provider Notes (Signed)
I performed a history and physical examination of  Chad George and discussed his management with Dr. Madie Reno. I agree with the history, physical, assessment, and plan of care, with the following exceptions: None I was present for the following procedures: None  Time Spent in Critical Care of the patient: None  Time spent in discussions with the patient and family: 5 minutes  Chad George  Pt with chronic cough. Vitals are stable, including pulsox. Xray, and then CT confirms fungal infection. Admission ordered. Deferring start of antifungal at this point, rather have ID see him in a few hours and give final recs. Stable for admission.  Varney Biles, MD 11/16/13 [phone]

## 2013-11-16 NOTE — Consult Note (Signed)
[facility] Infectious [facility]    Date of Admission:  11/15/2013          Reason for Consult: Pulmonary infiltrates and nodules    Referring Physician: Dr. Berle Mull  Principal Problem:   Left pulmonary infiltrate on CXR Active Problems:   ALLERGIC RHINITIS   Pulmonary nodules/lesions, multiple   Hyperglycemia   Dyslipidemia   . guaiFENesin  600 mg Oral BID  . heparin  5,000 Units Subcutaneous 3 times per day  . [START ON 11/17/2013] influenza vac split quadrivalent PF  0.5 mL Intramuscular Tomorrow-1000  . [START ON 11/17/2013] pneumococcal 23 valent vaccine  0.5 mL Intramuscular Tomorrow-1000    Recommendations: 1. Observe off of antibiotics 2. Continue airborne precautions 3. Agree with bronchoscopy with specimen sent for pathology, Gram stain,  routine culture, AFB and fungal cultures  Assessment: He has a progressive mid lung infiltrate that has masslike qualities as well as multiple small, bilateral pulmonary nodules. The nodules do not have the appearance of active tuberculosis but I cannot rule out the possibility that he has 2 processes, one causing nodules and now a superimposed left sided pneumonia. I would continue isolation for possible tuberculosis pending bronchoscopy. There is no indication to start empiric antibiotics at this time.    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 He was never lived anywhere else. He was in good health other than occasional problems with seasonal allergies and acid reflux until January when he began to develop some productive cough. He was seen in our urgent care Center. Chest x-ray revealed a left midlung infiltrate and he was treated with levofloxacin. He had some transient improvement but then his cough began to worsen. Has also noted some progressive shortness of breath and pleuritic posterior chest pain worse on the left than on the right. He has very good  appetite but has been trying to lose some weight because of his elevated cholesterol. He has not had any pain or swelling in his testicles. He has no known history of exposure to tuberculosis and has never been tested for tuberculosis before that he is aware of. He has not had any unusual travel or other exposures. He does have a fish tank in pet rabbits.    Review of Systems: Constitutional: positive for chills, fevers, sweats and weight loss, negative for anorexia Eyes: negative Ears, nose, mouth, throat, and face: negative Respiratory: positive for cough, dyspnea on exertion, pleurisy/chest pain and sputum, negative for emphysema and wheezing Cardiovascular: negative Gastrointestinal: negative Genitourinary:negative  Past Medical History  Diagnosis Date  . High cholesterol   . Pneumonia     History  Substance Use Topics  . Smoking status: Never Smoker   . Smokeless tobacco: Not on file  . Alcohol Use: No    Family History  Problem Relation Age of Onset  . Stroke Father    Allergies  Allergen Reactions  . Amoxicillin-Pot Clavulanate   . Penicillins     REACTION: RASH    OBJECTIVE: Blood pressure 119/72, pulse 81, temperature 98.8 F (37.1 C), temperature source Oral, resp. rate 18, height 5\' 6"  (1.676 m), weight 73.8 kg (162 lb 11.2 oz), SpO2 96.00%. General:  he is in no distress watching television  Skin: No rash  Lymph nodes: No palpable adenopathy  Oral: No oropharyngeal lesions  Lungs: Clear  Cor: Regular S1 and S2 no murmurs  Abdomen:  soft and nontender with no palpable masses  Joints and extremities: Normal  Lab Results Lab Results  Component Value Date   WBC 5.1 11/16/2013   HGB 14.7 11/16/2013   HCT 40.0 11/16/2013   MCV 83.2 11/16/2013   PLT 233 11/16/2013    Lab Results  Component Value Date   CREATININE 0.69 11/16/2013   BUN 11 11/16/2013   NA 142 11/16/2013   K 3.9 11/16/2013   CL 105 11/16/2013   CO2 22 11/16/2013    Lab Results  Component  Value Date   ALT 17 11/16/2013   AST 18 11/16/2013   ALKPHOS 124* 11/16/2013   BILITOT 0.6 11/16/2013     Microbiology: Recent Results (from the past 240 hour(s))  CULTURE, EXPECTORATED SPUTUM-ASSESSMENT     Status: None   Collection Time    11/16/13  5:03 AM      Result Value Ref Range Status   Specimen Description SPUTUM   Final   Special Requests NONE   Final   Sputum evaluation     Final   Value: MICROSCOPIC FINDINGS SUGGEST THAT THIS SPECIMEN IS NOT REPRESENTATIVE OF LOWER RESPIRATORY SECRETIONS. PLEASE RECOLLECT.     RESULT CALLED TO, READ BACK BY AND VERIFIED WITHBarnabas Harries RN [phone] 0630 GREEN R   Report Status 11/16/2013 FINAL   Final    Michel Bickers, MD [facility] [place] [facility] [phone] pager   [phone] cell 11/16/2013, 5:16 PM

## 2013-11-16 NOTE — Progress Notes (Signed)
Patient c/o back pain,MD on call text paged. Chad George [name],RN

## 2013-11-16 NOTE — Progress Notes (Signed)
[facility] PROGRESS NOTE  Chad George JSH:[id number] DOB: [birth year] DOA: 11/15/2013 PCP: Aurora Mask, NP  Interim history 43 year old male with no chronic medical problems presents with newly 3 month history of coughing sweats and subjective fevers and chills. He states that this began around [place] Year's time with productive cough with green sputum. The patient was placed on a seven-day course of antibiotics at the end of January 2015 without significant improvement. His primary care doctor provided him "an inhaler" Claritin, and nasal spray. There was minimal improvement. He continued to cough to the point of having small amounts of blood streaked sputum. He presents with increasing back pain associated with his cough.  Patient states that he has been trying to lose weight, but otherwise is eating well. He denies any recent travels. He has lived in [place] since moving from [place] 18 years ago. Denies any recent travels. Has never lived outside of [place] in [place]. He denies any recent sick contacts. There's been no history of TB exposure. The patient works as an Clinical biochemist. He denies any headache, nausea, vomiting, diarrhea, abdominal pain, dysuria, unusual rashes, synovitis. He has never had an HIV test. He has a remote history of smoking from which he only smoked for 2 years. There is a remote history of marijuana use but denies any other illegal drug use. CT chest revealed innumerable pulmonary nodules with masslike lesion in the lingula with extensive mediastinal lymphadenopathy. There is also an 8 mm ovoid lesion in the T9 area. The patient states that he has 7 pet rabbits and he has pet fish for which he cleans the fish tank. He denies any open wounds on his hands or arms. He denies any dental problems or dental pain. Assessment/Plan: Pulmonary consolidation/nodules -Clinically-suspicions for MTB is low--but sputum for AFB is being collected at this  time -Considerations include nontuberculous mycobacteria, fungal infection, neoplasm, possible zoonosis -Appreciate pulmonary consultation -Bronchoscopy is planned 11/16/2013 -Blood cultures x2 sets have been drawn -Fungal blood cultures -Fungal antibodies -Urine histoplasma antigen -Serum galactomannan -ID has been consulted -HIV antibody -Remain off antibiotics -Please send bronchoscopic specimens for fungal as well as AFB cultures in addition to routine bacterial cultures Hypokalemia  -Replete  -Check magnesium     Family Communication:   Pt at beside Disposition Plan:   Home when medically stable      Procedures/Studies: Dg Chest 2 View  11/15/2013   CLINICAL DATA:  Chest pain with cough. Recent diagnosis of pneumonia.  EXAM: CHEST  2 VIEW  COMPARISON:  10/01/2013  FINDINGS: Consolidation persists, predominantly in the left upper lobe lateral to the left hilum with some infiltrate also evident in the left lower lobe.  There are numerous bilateral small ill-defined pulmonary nodules. These are more prominent than on the prior study. No other areas of lung consolidation. No pleural effusion or pneumothorax.  Cardiac silhouette is normal in size. Left hilum is partly silhouetted with the left upper lobe consolidation normal mediastinal and right hilar contours.  Bony thorax is intact.  IMPRESSION: 1. Persistent consolidation extending laterally from the left hilum predominantly in the left upper lobe with some left lower lobe consolidation. 2. Multiple small pulmonary nodules which a more prominent than they did on the prior exam. 3. The persistence of the left sided consolidation, along with the mild increase in multiple ill-defined small pulmonary nodules suggests an atypical infection. Consider a fungal etiology. A followup contrast enhanced CT may be helpful.   Electronically Signed   By: Shanon Brow  Ormond M.D.   On: 11/15/2013 18:04   Ct Chest W Contrast  11/15/2013   CLINICAL DATA:   Shortness of breath. Chest pain and back pain. Pulmonary infiltrate.  EXAM: CT CHEST WITH CONTRAST  TECHNIQUE: Multidetector CT imaging of the chest was performed during intravenous contrast administration.  CONTRAST:  44mL OMNIPAQUE IOHEXOL 300 MG/ML  SOLN  COMPARISON:  Chest x-rays dated 10/01/2013 and 11/15/2013  FINDINGS: The patient has innumerable tiny bilateral pulmonary nodules as well as an extensive poorly defined masslike lesion in in the left upper lobe extending into the lingula. There is extensive mediastinal adenopathy. Left periaortic node on image 20 of series 2 measures 18 mm. A lymph node anterior to the left hilum on image 25 of series 2 is also 18 mm. The masslike area in the left upper lobe measures 5.4 x 4.7 x 3.7 cm.  Heart size and pulmonary vascularity are normal. No effusions. The visualized portion of the upper abdomen is normal. There is an 8 mm ovoid lucent lesion in the left posterior aspect of T9 vertebra, indeterminate.  The patient has extensive peribronchial thickening extending into the left lower lobe. This could be due to lymphatic obstruction or inflammation.  IMPRESSION: 1. Innumerable pulmonary nodules with a mass like ill-defined density in the left upper lobe as well as extensive mediastinal adenopathy. This could represent primary lung cancer with innumerable pulmonary metastases. Alternatively, this could represent an atypical infection such as coccidioidomycosis. 2. 8 mm lytic lesion in T9 which could represent metastatic disease but is indeterminate.   Electronically Signed   By: Rozetta Nunnery M.D.   On: 11/15/2013 22:07         Subjective: Patient denies any shortness breath, nausea, vomiting, diarrhea, vomiting, headache, dizziness, dysuria, hematuria, synovitis, rashes, fevers. He has subjective chills.   Objective: Filed Vitals:   11/15/13 2345 11/16/13 0026 11/16/13 0424 11/16/13 0813  BP: 122/82 120/60 111/66 119/77  Pulse: 94 86 89 80  Temp:  98.6  F (37 C) 98.4 F (36.9 C) 99.3 F (37.4 C)  TempSrc:  Oral Oral Oral  Resp: 23 20 18 18   Height:  5\' 6"  (1.676 m)    Weight:  73.8 kg (162 lb 11.2 oz)    SpO2: 96% 97% 99% 95%    Intake/Output Summary (Last 24 hours) at 11/16/13 7564 Last data filed at 11/16/13 0700  Gross per 24 hour  Intake    480 ml  Output    350 ml  Net    130 ml   Weight change:  Exam:   General:  Pt is alert, follows commands appropriately, not in acute distress  HEENT: No icterus, No thrush,  [place]/AT  Cardiovascular: RRR, S1/S2, no rubs, no gallops  Respiratory: Crackles on the right. Fluoroscopy on the left. No wheezing.   Abdomen: Soft/+BS, non tender, non distended, no guarding  Extremities: No edema, No lymphangitis, No petechiae, No rashes, no synovitis  Data Reviewed: Basic Metabolic Panel:  Recent Labs Lab 11/15/13 1853 11/16/13 0630  NA 140 142  K 3.2* 3.9  CL 103 105  CO2 23 22  GLUCOSE 124* 106*  BUN 12 11  CREATININE 0.72 0.69  CALCIUM 8.9 9.0   Liver Function Tests:  Recent Labs Lab 11/16/13 0630  AST 18  ALT 17  ALKPHOS 124*  BILITOT 0.6  PROT 6.4  ALBUMIN 3.7   No results found for this basename: LIPASE, AMYLASE,  in the last 168 hours No results found for this  basename: AMMONIA,  in the last 168 hours CBC:  Recent Labs Lab 11/15/13 1853 11/15/13 2112 11/16/13 0630  WBC 6.3 8.1 5.1  NEUTROABS  --   --  3.1  HGB 16.0 15.5 14.7  HCT 43.4 41.9 40.0  MCV 83.5 82.6 83.2  PLT 231 240 233   Cardiac Enzymes: No results found for this basename: CKTOTAL, CKMB, CKMBINDEX, TROPONINI,  in the last 168 hours BNP: No components found with this basename: POCBNP,  CBG: No results found for this basename: GLUCAP,  in the last 168 hours  Recent Results (from the past 240 hour(s))  CULTURE, EXPECTORATED SPUTUM-ASSESSMENT     Status: None   Collection Time    11/16/13  5:03 AM      Result Value Ref Range Status   Specimen Description SPUTUM   Final   Special  Requests NONE   Final   Sputum evaluation     Final   Value: MICROSCOPIC FINDINGS SUGGEST THAT THIS SPECIMEN IS NOT REPRESENTATIVE OF LOWER RESPIRATORY SECRETIONS. PLEASE RECOLLECT.     RESULT CALLED TO, READ BACK BY AND VERIFIED WITHBarnabas Harries RN [phone] 0630 GREEN R   Report Status 11/16/2013 FINAL   Final     Scheduled Meds: . guaiFENesin  600 mg Oral BID  . heparin  5,000 Units Subcutaneous 3 times per day  . [START ON 11/17/2013] influenza vac split quadrivalent PF  0.5 mL Intramuscular Tomorrow-1000  . [START ON 11/17/2013] pneumococcal 23 valent vaccine  0.5 mL Intramuscular Tomorrow-1000   Continuous Infusions:    Teresia Myint, DO  [facility] Pager [phone]  If 7PM-7AM, please contact night-coverage [email or web address] Password [email or web address] 11/16/2013, 8:23 AM   LOS: 1 day

## 2013-11-16 NOTE — Progress Notes (Signed)
11/16/13 00:25 Received patient from ED,admitted due to cough and SOB.Patient is alert and oriented x4.Skin intact.Placed on airborne precaution r/o TB.Oriented to room and unit routines.Advised to call for any assistance.Call bell within reach. Arisbel Maione [name],RN

## 2013-11-17 ENCOUNTER — Encounter (HOSPITAL_COMMUNITY)
Admission: EM | Disposition: A | Discharge status: Home / Self Care | Payer: Self-pay | Source: Home / Self Care | Attending: Internal Medicine

## 2013-11-17 ENCOUNTER — Inpatient Hospital Stay (HOSPITAL_COMMUNITY): Payer: Medicaid Other

## 2013-11-17 ENCOUNTER — Encounter (HOSPITAL_COMMUNITY): Payer: Self-pay | Admitting: Respiratory Therapy

## 2013-11-17 HISTORY — PX: VIDEO BRONCHOSCOPY: SHX5072

## 2013-11-17 LAB — CBC WITH DIFFERENTIAL/PLATELET
BASOS ABS: 0 10*3/uL (ref 0.0–0.1)
Basophils Relative: 0 % (ref 0–1)
EOS ABS: 0.3 10*3/uL (ref 0.0–0.7)
Eosinophils Relative: 6 % — ABNORMAL HIGH (ref 0–5)
HCT: 41.8 % (ref 39.0–52.0)
Hemoglobin: 15.3 g/dL (ref 13.0–17.0)
Lymphocytes Relative: 35 % (ref 12–46)
Lymphs Abs: 1.9 10*3/uL (ref 0.7–4.0)
MCH: 30.8 pg (ref 26.0–34.0)
MCHC: 36.6 g/dL — AB (ref 30.0–36.0)
MCV: 84.3 fL (ref 78.0–100.0)
Monocytes Absolute: 0.4 10*3/uL (ref 0.1–1.0)
Monocytes Relative: 7 % (ref 3–12)
Neutro Abs: 2.8 10*3/uL (ref 1.7–7.7)
Neutrophils Relative %: 52 % (ref 43–77)
PLATELETS: 237 10*3/uL (ref 150–400)
RBC: 4.96 MIL/uL (ref 4.22–5.81)
RDW: 12 % (ref 11.5–15.5)
WBC: 5.4 10*3/uL (ref 4.0–10.5)

## 2013-11-17 LAB — LEGIONELLA ANTIGEN, URINE: Legionella Antigen, Urine: NEGATIVE

## 2013-11-17 SURGERY — BRONCHOSCOPY, WITH FLUOROSCOPY
Anesthesia: Moderate Sedation | Laterality: Bilateral

## 2013-11-17 MED ORDER — LIDOCAINE HCL 2 % EX GEL
CUTANEOUS | Status: DC | PRN
Start: 2013-11-17 — End: 2013-11-18
  Administered 2013-11-17: 1

## 2013-11-17 MED ORDER — FENTANYL CITRATE 0.05 MG/ML IJ SOLN
INTRAMUSCULAR | Status: AC
  Filled 2013-11-17: qty 4

## 2013-11-17 MED ORDER — ALBUTEROL SULFATE (2.5 MG/3ML) 0.083% IN NEBU
2.5000 mg | INHALATION_SOLUTION | RESPIRATORY_TRACT | Status: DC | PRN
  Filled 2013-11-17: qty 3

## 2013-11-17 MED ORDER — SODIUM CHLORIDE 0.9 % IV SOLN
INTRAVENOUS | Status: DC
  Administered 2013-11-17: 11:00:00 via INTRAVENOUS

## 2013-11-17 MED ORDER — MIDAZOLAM HCL 5 MG/ML IJ SOLN
INTRAMUSCULAR | Status: AC
  Filled 2013-11-17: qty 2

## 2013-11-17 MED ORDER — LIDOCAINE HCL 1 % IJ SOLN
INTRAMUSCULAR | Status: DC | PRN
  Administered 2013-11-17: 6 mL via RESPIRATORY_TRACT

## 2013-11-17 MED ORDER — PHENYLEPHRINE HCL 0.25 % NA SOLN
NASAL | Status: DC | PRN
  Administered 2013-11-17: 1 via NASAL

## 2013-11-17 MED ORDER — MIDAZOLAM HCL 10 MG/2ML IJ SOLN
INTRAMUSCULAR | Status: DC | PRN
  Administered 2013-11-17 (×2): 1 mg via INTRAVENOUS
  Administered 2013-11-17: .5 mg via INTRAVENOUS
  Administered 2013-11-17: 1 mg via INTRAVENOUS
  Administered 2013-11-17: .5 mg via INTRAVENOUS
  Administered 2013-11-17: 1 mg via INTRAVENOUS

## 2013-11-17 MED ORDER — FENTANYL CITRATE 0.05 MG/ML IJ SOLN
INTRAMUSCULAR | Status: DC | PRN
  Administered 2013-11-17 (×2): 50 ug via INTRAVENOUS
  Administered 2013-11-17 (×2): 25 ug via INTRAVENOUS
  Administered 2013-11-17: 50 ug via INTRAVENOUS

## 2013-11-17 NOTE — Procedures (Signed)
Bronchoscopy Procedure Note Chad George [id number] [birth year]  Procedure: Bronchoscopy Indications: Diagnostic evaluation of the airways and Obtain specimens for culture and/or other diagnostic studies  Procedure Details Consent: Risks of procedure as well as the alternatives and risks of each were explained to the (patient/caregiver).  Consent for procedure obtained. Time Out: Verified patient identification, verified procedure, site/side was marked, verified correct patient position, special equipment/implants available, medications/allergies/relevent history reviewed, required imaging and test results available.  Performed  In preparation for procedure, bronchoscope lubricated. Sedation: Benzodiazepines and Fentanyl  Airway entered and the following bronchi were examined: RUL, RML, RLL, LUL, LLL and Bronchi.   Procedures performed: Brushings performed at LUL anterior segment. Bronchoscope removed.    This was a very difficult bronchoscopy.  Patient was gagging and cough even when fully sedate.  Would have liked a more cooperative situation inorder to get better biopsy.  The airway was patent however, no evidence of obstruction note.  Having said that, airway was very inflammed, narrowed and bloody.  Sample were obtained via brushing from the LUL anterior segment out-take and BAL from the same area.  If a concern remains for malignancy and brushing and BAL are negative then will need either a lymph node biopsy or to be brought back down to the ICU, intubated, paralyzed and done properly.  Evaluation Hemodynamic Status: BP stable throughout; O2 sats: stable throughout Patient's Current Condition: stable Specimens:  Sent serosanguinous fluid Complications: No apparent complications Patient did tolerate procedure well.   Jennet Maduro 11/17/2013

## 2013-11-17 NOTE — Progress Notes (Signed)
Patient ID: Chad George, male   DOB: Feb 19, 1971, 43 y.o.   MRN: [id number]         [facility]    Date of Admission:  11/15/2013     [name] was out of his room for his bronchoscopy today when I came by. He remains afebrile. His initial sputum AFB smear is negative. I will continue observation off of antibiotics pending results of bronchoscopy.  Michel Bickers, MD [facility] [phone] pager   [phone] cell 11/17/2013, 5:52 PM

## 2013-11-17 NOTE — Progress Notes (Signed)
Video Bronchoscopy done  Intervention Bronchial Washing Intervention Bronchial Brushing done  Arbie Cookey Diondre Pulis,RRT,RPFT,RCP

## 2013-11-17 NOTE — Progress Notes (Signed)
PULMONARY / CRITICAL CARE MEDICINE   Name: Chad George MRN: [id number] DOB: 1970/11/22    ADMISSION DATE:  11/15/2013 CONSULTATION DATE:  11/16/2013  REFERRING MD :  Posey Pronto PRIMARY SERVICE: TRH  CHIEF COMPLAINT:  Cough  BRIEF PATIENT DESCRIPTION: 43 y/o male was admitted on 3/9 to [facility] with several weeks of cough and green sputum production.  He was found to have a left upper lung mass with innumerable pulmonary nodules.  SIGNIFICANT EVENTS / STUDIES:  3/9 CT chest > mass like lesion left upper lobe extending into lingula with innumerable tiny bilateral pulmonary nodules and mediastinal lymphadenopathy; T9 lytic lesion noted  LINES / TUBES:   CULTURES: 3/10 blood >> 3/10 sputum >> 3/10 sputum AFB >>  ANTIBIOTICS:   HISTORY OF PRESENT ILLNESS:  43 y/o male was admitted on 3/9 to [facility] with several weeks of cough and green sputum production.  He was found to have a left upper lung mass with innumerable pulmonary nodules. He says that he developed the cough with green sputum production in January and was treated for one week with ceftriaxone x1 and moxifloxacin for a week on January 23.  He says that he felt like the cough improved somewhat initially, but throughout February he noticed increasing cough, mucus production (green), and dyspnea.  He had ocassional mild sweats at night (just forehead) and rare chills but doesn't recall a fever or rigors.  His wife has been sick in the last few days.  He works in a Air traffic controller.  He has lost weight intentionally due to high cholesterol.  He has not noticed any testicular lesions or masses.SUBJECTIVE:   VITAL SIGNS: Temp:  [98.5 F (36.9 C)-99.1 F (37.3 C)] 98.6 F (37 C) (03/11 0528) Pulse Rate:  [70-81] 70 (03/11 0528) Resp:  [16-20] 16 (03/11 0528) BP: (104-119)/(65-75) 104/65 mmHg (03/11 0528) SpO2:  [96 %] 96 % (03/11 0528) Weight:  [74.435 kg (164 lb 1.6 oz)] 74.435 kg (164 lb 1.6 oz) (03/10 2050) HEMODYNAMICS:    VENTILATOR SETTINGS:   INTAKE / OUTPUT: Intake/Output     03/10 0701 - 03/11 0700 03/11 0701 - 03/12 0700   P.O. 360    Total Intake(mL/kg) 360 (4.8)    Urine (mL/kg/hr) 500 (0.3)    Total Output 500     Net -140            PHYSICAL EXAMINATION:  Gen: well appearing, no acute distress HEENT: No LAN/JVD PULM: Crackles left lateral with a wheeze, few crackles R base otherwise clear CV: RRR, no mgr, no JVD AB: BS+, soft, nontender, no hsm Ext: warm, no edema, no clubbing, no cyanosis Derm: no rash or skin breakdown Neuro: A&Ox4, CN II-XII intact, strength 5/5 in all 4 extremities   LABS:  CBC  Recent Labs Lab 11/15/13 2112 11/16/13 0630 11/17/13 0710  WBC 8.1 5.1 5.4  HGB 15.5 14.7 15.3  HCT 41.9 40.0 41.8  PLT 240 233 237   Coag's  Recent Labs Lab 11/16/13 1230  INR 1.09   BMET  Recent Labs Lab 11/15/13 1853 11/16/13 0630  NA 140 142  K 3.2* 3.9  CL 103 105  CO2 23 22  BUN 12 11  CREATININE 0.72 0.69  GLUCOSE 124* 106*   Electrolytes  Recent Labs Lab 11/15/13 1853 11/16/13 0630  CALCIUM 8.9 9.0   Sepsis Markers No results found for this basename: LATICACIDVEN, PROCALCITON, O2SATVEN,  in the last 168 hours ABG No results found for this basename: PHART, PCO2ART,  PO2ART,  in the last 168 hours Liver Enzymes  Recent Labs Lab 11/16/13 0630  AST 18  ALT 17  ALKPHOS 124*  BILITOT 0.6  ALBUMIN 3.7   Cardiac Enzymes No results found for this basename: TROPONINI, PROBNP,  in the last 168 hours Glucose No results found for this basename: GLUCAP,  in the last 168 hours  Imaging    CXR: left lung consolidation with innumerable pulm nodules  ASSESSMENT / PLAN:  PULMONARY A: Subacute cough with mucus production and a left upper lobe consolidation vs mass and innumerable pulmonary nodules.  I am most concerned about possible malignancy (testicular vs primary lung) vs an atypical infection like a fungal infection vs less likely sarcoid.   Radiographically this does not look like TB to me, but I value our ID colleagues opinion here. P:   -plan for bronchoscopy 3/11 with fluor and TB precautions -will perform TBBX, brushing and BAL -testicular exam   INFECTIOUS A:  Post obstructive pneumonia vs atypical infection? See above P:   -agree with holding antibiotics for now -ID consult  Richardson Landry Minor ACNP [facility] Pager [phone] till 3 pm If no answer page [phone] 11/17/2013, 9:23 AM

## 2013-11-17 NOTE — Progress Notes (Signed)
Called to patient's bedside. Patient sitting up on side of bed, states he spit up "pure blood" just now when he was taking a shower. Patient also c/o increased pain (now a 10/10) in right, mid back when he lays down.  Patient currently breathing without any difficulty, chest expansion equal, breath sounds diminished throughout, as they have been throughout the day. Patient is s/p bronchoscopy this morning. Dr. Coralyn Pear notified. Patient given pain medication. Will continue to monitor patient closely.

## 2013-11-17 NOTE — Progress Notes (Signed)
[facility] PROGRESS NOTE  Chad George EYC:[id number] DOB: 12/23/1970 DOA: 11/15/2013 PCP: Aurora Mask, NP  Assessment/Plan: 1. Pulmonary nodules. Unclear etiology, possibilities includeTB, nontuberculous TB, fungal infection, malignancy. Blood cultures remaining negative thus far. Fungal cultures negative.  Sputum for AFB have been sent, no acid-fast bacilli seen from specimen sent on 11/16/2013. HIV non-reactive.  The patient remains off on antimicrobial therapy. He underwent bronchoscopy on 11/17/2013, brushings were performed. Infectious disease was consulted and recommended continuing airborne precautions, observation off of antibiotics, followup on bronchoscopy results.   Code Status: Full Code Family Communication: I spoke with family Disposition Plan: Continue supportive care   Consultants:  Pulmonary medicine  Infectious disease  Procedures:  Bronchoscopy performed on 11/17/2013  Antibiotics:  None  HPI/Subjective: 43 year-old male with no chronic medical problems presents with newly 3 month history of coughing sweats and subjective fevers and chills. He states that this began around [place] Year's time with productive cough with green sputum. The patient was placed on a seven-day course of antibiotics at the end of January 2015 without significant improvement. His primary care doctor provided him "an inhaler" Claritin, and nasal spray. There was minimal improvement. He continued to cough to the point of having small amounts of blood streaked sputum. He presents with increasing back pain associated with his cough.  Patient states that he has been trying to lose weight, but otherwise is eating well. He denies any recent travels. He has lived in [place] since moving from [place] 18 years ago. Denies any recent travels. Has never lived outside of [place] in [place]. He denies any recent sick contacts. There's been no history of TB exposure. The patient works  as an Clinical biochemist. He denies any headache, nausea, vomiting, diarrhea, abdominal pain, dysuria, unusual rashes, synovitis. He has never had an HIV test. He has a remote history of smoking from which he only smoked for 2 years. There is a remote history of marijuana use but denies any other illegal drug use. CT chest revealed innumerable pulmonary nodules with masslike lesion in the lingula with extensive mediastinal lymphadenopathy. There is also an 8 mm ovoid lesion in the T9 area. The patient states that he has 7 pet rabbits and he has pet fish for which he cleans the fish tank. He denies any open wounds on his hands or arms. He denies any dental problems or dental pain. Patient undergoing bronchoscopy on 11/17/2013, procedure performed by Dr.Yacoub, found to have a patent airway, although inflamed. Brushings performed at the LUL anterior segment  Assessment/Plan:   Objective: Filed Vitals:   11/17/13 1310  BP: 119/81  Pulse: 83  Temp: 98.6 F (37 C)  Resp: 14    Intake/Output Summary (Last 24 hours) at 11/17/13 1539 Last data filed at 11/17/13 1359  Gross per 24 hour  Intake    180 ml  Output      0 ml  Net    180 ml   Filed Weights   11/16/13 0026 11/16/13 2050  Weight: 73.8 kg (162 lb 11.2 oz) 74.435 kg (164 lb 1.6 oz)    Exam:  General: Pt is alert, follows commands appropriately, not in acute distress  HEENT: No icterus, No thrush, [place]/AT  Cardiovascular: RRR, S1/S2, no rubs, no gallops  Respiratory: Crackles on the right. Fluoroscopy on the left. No wheezing.  Abdomen: Soft/+BS, non tender, non distended, no guarding  Extremities: No edema, No lymphangitis, No petechiae, No rashes, no synovitis   Data Reviewed: Basic Metabolic Panel:  Recent Labs Lab 11/15/13 1853 11/16/13 0630  NA 140 142  K 3.2* 3.9  CL 103 105  CO2 23 22  GLUCOSE 124* 106*  BUN 12 11  CREATININE 0.72 0.69  CALCIUM 8.9 9.0   Liver Function Tests:  Recent Labs Lab 11/16/13 0630  AST  18  ALT 17  ALKPHOS 124*  BILITOT 0.6  PROT 6.4  ALBUMIN 3.7   No results found for this basename: LIPASE, AMYLASE,  in the last 168 hours No results found for this basename: AMMONIA,  in the last 168 hours CBC:  Recent Labs Lab 11/15/13 1853 11/15/13 2112 11/16/13 0630 11/17/13 0710  WBC 6.3 8.1 5.1 5.4  NEUTROABS  --   --  3.1 2.8  HGB 16.0 15.5 14.7 15.3  HCT 43.4 41.9 40.0 41.8  MCV 83.5 82.6 83.2 84.3  PLT 231 240 233 237   Cardiac Enzymes: No results found for this basename: CKTOTAL, CKMB, CKMBINDEX, TROPONINI,  in the last 168 hours BNP (last 3 results) No results found for this basename: PROBNP,  in the last 8760 hours CBG: No results found for this basename: GLUCAP,  in the last 168 hours  Recent Results (from the past 240 hour(s))  CULTURE, EXPECTORATED SPUTUM-ASSESSMENT     Status: None   Collection Time    11/16/13  5:03 AM      Result Value Ref Range Status   Specimen Description SPUTUM   Final   Special Requests NONE   Final   Sputum evaluation     Final   Value: MICROSCOPIC FINDINGS SUGGEST THAT THIS SPECIMEN IS NOT REPRESENTATIVE OF LOWER RESPIRATORY SECRETIONS. PLEASE RECOLLECT.     RESULT CALLED TO, READ BACK BY AND VERIFIED WITHBarnabas Harries RN [phone] 0630 GREEN R   Report Status 11/16/2013 FINAL   Final  AFB CULTURE WITH SMEAR     Status: None   Collection Time    11/16/13  5:05 AM      Result Value Ref Range Status   Specimen Description SPUTUM   Final   Special Requests NONE   Final   ACID FAST SMEAR     Final   Value: NO ACID FAST BACILLI SEEN     Performed at [facility]   Culture     Final   Value: CULTURE WILL BE EXAMINED FOR 6 WEEKS BEFORE ISSUING A FINAL REPORT     Performed at [facility]   Report Status PENDING   Incomplete  CULTURE, BLOOD (ROUTINE X 2)     Status: None   Collection Time    11/16/13  5:30 AM      Result Value Ref Range Status   Specimen Description BLOOD LEFT UPPER ARM   Final   Special  Requests     Final   Value: BOTTLES DRAWN AEROBIC AND ANAEROBIC 10CC BLUE,2CC RED   Culture  Setup Time     Final   Value: 11/16/2013 08:51     Performed at [facility]   Culture     Final   Value:        BLOOD CULTURE RECEIVED NO GROWTH TO DATE CULTURE WILL BE HELD FOR 5 DAYS BEFORE ISSUING A FINAL NEGATIVE REPORT     Performed at [facility]   Report Status PENDING   Incomplete  CULTURE, BLOOD (ROUTINE X 2)     Status: None   Collection Time    11/16/13  5:38 AM  Result Value Ref Range Status   Specimen Description BLOOD RIGHT UPPER ARM   Final   Special Requests BOTTLES DRAWN AEROBIC AND ANAEROBIC 10CC EACH   Final   Culture  Setup Time     Final   Value: 11/16/2013 08:51     Performed at [facility]   Culture     Final   Value:        BLOOD CULTURE RECEIVED NO GROWTH TO DATE CULTURE WILL BE HELD FOR 5 DAYS BEFORE ISSUING A FINAL NEGATIVE REPORT     Performed at [facility]   Report Status PENDING   Incomplete  FUNGUS CULTURE, BLOOD     Status: None   Collection Time    11/16/13  9:50 AM      Result Value Ref Range Status   Specimen Description BLOOD LEFT ANTECUBITAL   Final   Special Requests BOTTLES DRAWN AEROBIC AND ANAEROBIC 10CC   Final   Culture     Final   Value: NO FUNGUS ISOLATED;CULTURE IN PROGRESS FOR 7 DAYS     Performed at [facility]   Report Status PENDING   Incomplete     Studies: Dg Chest 2 View  11/15/2013   CLINICAL DATA:  Chest pain with cough. Recent diagnosis of pneumonia.  EXAM: CHEST  2 VIEW  COMPARISON:  10/01/2013  FINDINGS: Consolidation persists, predominantly in the left upper lobe lateral to the left hilum with some infiltrate also evident in the left lower lobe.  There are numerous bilateral small ill-defined pulmonary nodules. These are more prominent than on the prior study. No other areas of lung consolidation. No pleural effusion or pneumothorax.  Cardiac silhouette is normal in size. Left hilum  is partly silhouetted with the left upper lobe consolidation normal mediastinal and right hilar contours.  Bony thorax is intact.  IMPRESSION: 1. Persistent consolidation extending laterally from the left hilum predominantly in the left upper lobe with some left lower lobe consolidation. 2. Multiple small pulmonary nodules which a more prominent than they did on the prior exam. 3. The persistence of the left sided consolidation, along with the mild increase in multiple ill-defined small pulmonary nodules suggests an atypical infection. Consider a fungal etiology. A followup contrast enhanced CT may be helpful.   Electronically Signed   By: Lajean Manes M.D.   On: 11/15/2013 18:04   Ct Chest W Contrast  11/15/2013   CLINICAL DATA:  Shortness of breath. Chest pain and back pain. Pulmonary infiltrate.  EXAM: CT CHEST WITH CONTRAST  TECHNIQUE: Multidetector CT imaging of the chest was performed during intravenous contrast administration.  CONTRAST:  50mL OMNIPAQUE IOHEXOL 300 MG/ML  SOLN  COMPARISON:  Chest x-rays dated 10/01/2013 and 11/15/2013  FINDINGS: The patient has innumerable tiny bilateral pulmonary nodules as well as an extensive poorly defined masslike lesion in in the left upper lobe extending into the lingula. There is extensive mediastinal adenopathy. Left periaortic node on image 20 of series 2 measures 18 mm. A lymph node anterior to the left hilum on image 25 of series 2 is also 18 mm. The masslike area in the left upper lobe measures 5.4 x 4.7 x 3.7 cm.  Heart size and pulmonary vascularity are normal. No effusions. The visualized portion of the upper abdomen is normal. There is an 8 mm ovoid lucent lesion in the left posterior aspect of T9 vertebra, indeterminate.  The patient has extensive peribronchial thickening extending into the left lower lobe. This could  be due to lymphatic obstruction or inflammation.  IMPRESSION: 1. Innumerable pulmonary nodules with a mass like ill-defined density in the  left upper lobe as well as extensive mediastinal adenopathy. This could represent primary lung cancer with innumerable pulmonary metastases. Alternatively, this could represent an atypical infection such as coccidioidomycosis. 2. 8 mm lytic lesion in T9 which could represent metastatic disease but is indeterminate.   Electronically Signed   By: Rozetta Nunnery M.D.   On: 11/15/2013 22:07    Scheduled Meds: . butamben-tetracaine-benzocaine  1 spray Topical Once  . guaiFENesin  600 mg Oral BID  . heparin  5,000 Units Subcutaneous 3 times per day  . lidocaine   Topical Once   Continuous Infusions: . sodium chloride 20 mL/hr at 11/17/13 1045    Principal Problem:   Left pulmonary infiltrate on CXR Active Problems:   ALLERGIC RHINITIS   Pulmonary nodules/lesions, multiple   Hyperglycemia   Dyslipidemia    Time spent: 35  min    Kelvin Cellar  [facility] Pager [phone]. If 7PM-7AM, please contact night-coverage at [email or web address], password [facility] 11/17/2013, 3:39 PM  LOS: 2 days

## 2013-11-17 NOTE — Progress Notes (Signed)
Patient returned from Bronchoscopy. Patient arousable, oriented x4. Patient denies pain at this time. Patient's VS's all WNL. Patient resting in bed with family at the bedside.  Will continue to monitor closely.

## 2013-11-17 NOTE — Progress Notes (Signed)
Pt moving and coughing  Increased 02 to 6 LPM O2 saturation 91%

## 2013-11-18 ENCOUNTER — Inpatient Hospital Stay (HOSPITAL_COMMUNITY): Payer: Medicaid Other

## 2013-11-18 ENCOUNTER — Encounter (HOSPITAL_COMMUNITY): Payer: Self-pay | Admitting: Pulmonary Disease

## 2013-11-18 LAB — ASPERGILLUS GALACTOMANNAN ANTIGEN
Aspergillus galactomannan Ag: NOT DETECTED
Aspergillus galactomannan Index: 0.1 (ref <0.50)

## 2013-11-18 LAB — BASIC METABOLIC PANEL
BUN: 15 mg/dL (ref 6–23)
CALCIUM: 9.1 mg/dL (ref 8.4–10.5)
CO2: 22 mEq/L (ref 19–32)
CREATININE: 0.82 mg/dL (ref 0.50–1.35)
Chloride: 104 mEq/L (ref 96–112)
GLUCOSE: 91 mg/dL (ref 70–99)
Potassium: 3.4 mEq/L — ABNORMAL LOW (ref 3.7–5.3)
Sodium: 141 mEq/L (ref 137–147)

## 2013-11-18 LAB — CBC
HEMATOCRIT: 42.2 % (ref 39.0–52.0)
HEMOGLOBIN: 15.4 g/dL (ref 13.0–17.0)
MCH: 30.5 pg (ref 26.0–34.0)
MCHC: 36.5 g/dL — ABNORMAL HIGH (ref 30.0–36.0)
MCV: 83.6 fL (ref 78.0–100.0)
Platelets: 235 10*3/uL (ref 150–400)
RBC: 5.05 MIL/uL (ref 4.22–5.81)
RDW: 11.8 % (ref 11.5–15.5)
WBC: 6.6 10*3/uL (ref 4.0–10.5)

## 2013-11-18 NOTE — Progress Notes (Signed)
PULMONARY / CRITICAL CARE MEDICINE   Name: Cristina Mattern MRN: [id number] DOB: 1971-06-27    ADMISSION DATE:  11/15/2013 CONSULTATION DATE:  11/16/2013  REFERRING MD :  Posey Pronto PRIMARY SERVICE: TRH  CHIEF COMPLAINT:  Cough  BRIEF PATIENT DESCRIPTION: 43 y/o male was admitted on 3/9 to [facility] with several weeks of cough and green sputum production.  He was found to have a left upper lung mass with innumerable pulmonary nodules.  SIGNIFICANT EVENTS / STUDIES:  3/9 CT chest > mass like lesion left upper lobe extending into lingula with innumerable tiny bilateral pulmonary nodules and mediastinal lymphadenopathy; T9 lytic lesion noted 3/11 FOB per Dr. Nelda Marseille. Poor visualization due to patient movement.    LINES / TUBES:   CULTURES: 3/10 blood >> 3/10 sputum >> 3/10 sputum AFB >>none 3/11 fob path>> ANTIBIOTICS: Off Per ID  HISTORY OF PRESENT ILLNESS:  43 y/o male was admitted on 3/9 to [facility] with several weeks of cough and green sputum production.  He was found to have a left upper lung mass with innumerable pulmonary nodules. He says that he developed the cough with green sputum production in January and was treated for one week with ceftriaxone x1 and moxifloxacin for a week on January 23.  He says that he felt like the cough improved somewhat initially, but throughout February he noticed increasing cough, mucus production (green), and dyspnea.  He had ocassional mild sweats at night (just forehead) and rare chills but doesn't recall a fever or rigors.  His wife has been sick in the last few days.  He works in a Air traffic controller.  He has lost weight intentionally due to high cholesterol.  He has not noticed any testicular lesions or masses. SUBJECTIVE:  Awake and alert, sitting up eating.  VITAL SIGNS: Temp:  [97.4 F (36.3 C)-98.8 F (37.1 C)] 98.1 F (36.7 C) (03/12 0843) Pulse Rate:  [70-133] 87 (03/12 0843) Resp:  [10-23] 18 (03/12 0843) BP: (103-160)/(62-99) 123/81 mmHg (03/12  0843) SpO2:  [90 %-97 %] 95 % (03/12 0843) Weight:  [73.891 kg (162 lb 14.4 oz)] 73.891 kg (162 lb 14.4 oz) (03/11 2100) HEMODYNAMICS:   VENTILATOR SETTINGS:   INTAKE / OUTPUT: Intake/Output     03/11 0701 - 03/12 0700 03/12 0701 - 03/13 0700   P.O. 300    Total Intake(mL/kg) 300 (4.1)    Urine (mL/kg/hr)     Total Output       Net +300            PHYSICAL EXAMINATION:  Gen: well appearing, no acute distress, sitting in chair HEENT: No LAN/JVD PULM: decreased bs bases CV: RRR, no mgr, no JVD AB: BS+, soft, nontender, no hsm Ext: warm, no edema, no clubbing, no cyanosis Derm: no rash or skin breakdown Neuro: intact   LABS:  CBC  Recent Labs Lab 11/16/13 0630 11/17/13 0710 11/18/13 0457  WBC 5.1 5.4 6.6  HGB 14.7 15.3 15.4  HCT 40.0 41.8 42.2  PLT 233 237 235   Coag's  Recent Labs Lab 11/16/13 1230  INR 1.09   BMET  Recent Labs Lab 11/15/13 1853 11/16/13 0630 11/18/13 0457  NA 140 142 141  K 3.2* 3.9 3.4*  CL 103 105 104  CO2 23 22 22   BUN 12 11 15   CREATININE 0.72 0.69 0.82  GLUCOSE 124* 106* 91   Electrolytes  Recent Labs Lab 11/15/13 1853 11/16/13 0630 11/18/13 0457  CALCIUM 8.9 9.0 9.1   Sepsis Markers No results  found for this basename: LATICACIDVEN, PROCALCITON, O2SATVEN,  in the last 168 hours ABG No results found for this basename: PHART, PCO2ART, PO2ART,  in the last 168 hours Liver Enzymes  Recent Labs Lab 11/16/13 0630  AST 18  ALT 17  ALKPHOS 124*  BILITOT 0.6  ALBUMIN 3.7   Cardiac Enzymes No results found for this basename: TROPONINI, PROBNP,  in the last 168 hours Glucose No results found for this basename: GLUCAP,  in the last 168 hours  Imaging No results found.    CXR: left lung consolidation with innumerable pulm nodules  ASSESSMENT / PLAN:  PULMONARY A: Subacute cough with mucus production and a left upper lobe consolidation vs mass and innumerable pulmonary nodules.  I am most concerned about  possible malignancy (testicular vs primary lung) vs an atypical infection like a fungal infection vs less likely sarcoid.  Radiographically this does not look like TB to me, but I value our ID colleagues opinion here. P:   - bronchoscopy 3/11 with fluor and TB precautions - will perform TBBX, brushing and BAL - repeat cx  r 3/12 post fob follow up - testicular exam   INFECTIOUS A:  Post obstructive pneumonia vs atypical infection? See above P:   - agree with holding antibiotics for now - ID consult  Note: May need repeat FOB with intubation and sedation in ICU if current FOB has a low yield.  Richardson Landry Minor ACNP [facility] Pager [phone] till 3 pm If no answer page [phone] 11/18/2013, 9:22 AM  AFB pending.  Cytology pending.  Will schedule for EBUS with Dr. Lamonte Sakai in AM.  Patient seen and examined, agree with above note.  I dictated the care and orders written for this patient under my direction.  Rush Farmer, MD [phone]

## 2013-11-18 NOTE — Progress Notes (Signed)
[facility] for Infectious Disease    Subjective: No new complaints   Antibiotics:  Anti-infectives   None      Medications: Scheduled Meds: . butamben-tetracaine-benzocaine  1 spray Topical Once  . guaiFENesin  600 mg Oral BID  . heparin  5,000 Units Subcutaneous 3 times per day  . lidocaine   Topical Once   Continuous Infusions: . sodium chloride 20 mL/hr at 11/17/13 1045   PRN Meds:.albuterol, benzonatate, fentaNYL, HYDROmorphone (DILAUDID) injection, lidocaine, lidocaine, midazolam, oxyCODONE, phenylephrine, phenylephrine   Objective: Weight change: -1 lb 3.2 oz (-0.544 kg)  Intake/Output Summary (Last 24 hours) at 11/18/13 1325 Last data filed at 11/18/13 0900  Gross per 24 hour  Intake    540 ml  Output      0 ml  Net    540 ml   Blood pressure 123/81, pulse 87, temperature 98.1 F (36.7 C), temperature source Oral, resp. rate 18, height 5\' 6"  (1.676 m), weight 162 lb 14.4 oz (73.891 kg), SpO2 95.00%. Temp:  [97.4 F (36.3 C)-98.8 F (37.1 C)] 98.1 F (36.7 C) (03/12 0843) Pulse Rate:  [78-92] 87 (03/12 0843) Resp:  [17-18] 18 (03/12 0843) BP: (103-124)/(62-81) 123/81 mmHg (03/12 0843) SpO2:  [93 %-95 %] 95 % (03/12 0843) Weight:  [162 lb 14.4 oz (73.891 kg)] 162 lb 14.4 oz (73.891 kg) (03/11 2100)  Physical Exam: General: Alert and awake, oriented x3, not in any acute distress. HEENT: anicteric sclera, pupils reactive to light and accommodation, EOMI CVS regular rate, normal r,  no murmur rubs or gallops Chest: clear to auscultation bilaterally, no wheezing, rales or rhonchi Abdomen: soft nontender, nondistended, normal bowel sounds, Extremities: no  clubbing or edema noted bilaterally Skin: no rashes Lymph: no new lymphadenopathy Neuro: nonfocal  Lab Results:  Recent Labs  11/17/13 0710 11/18/13 0457  WBC 5.4 6.6  HGB 15.3 15.4  HCT 41.8 42.2  PLT 237 235    BMET  Recent Labs  11/16/13 0630 11/18/13 0457  NA 142 141  K 3.9  3.4*  CL 105 104  CO2 22 22  GLUCOSE 106* 91  BUN 11 15  CREATININE 0.69 0.82  CALCIUM 9.0 9.1    Micro Results: Recent Results (from the past 240 hour(s))  CULTURE, EXPECTORATED SPUTUM-ASSESSMENT     Status: None   Collection Time    11/16/13  5:03 AM      Result Value Ref Range Status   Specimen Description SPUTUM   Final   Special Requests NONE   Final   Sputum evaluation     Final   Value: MICROSCOPIC FINDINGS SUGGEST THAT THIS SPECIMEN IS NOT REPRESENTATIVE OF LOWER RESPIRATORY SECRETIONS. PLEASE RECOLLECT.     RESULT CALLED TO, READ BACK BY AND VERIFIED WITHBarnabas Harries RN [phone] 0630 GREEN R   Report Status 11/16/2013 FINAL   Final  AFB CULTURE WITH SMEAR     Status: None   Collection Time    11/16/13  5:05 AM      Result Value Ref Range Status   Specimen Description SPUTUM   Final   Special Requests NONE   Final   ACID FAST SMEAR     Final   Value: NO ACID FAST BACILLI SEEN     Performed at [facility]   Culture     Final   Value: CULTURE WILL BE EXAMINED FOR 6 WEEKS BEFORE ISSUING A FINAL REPORT     Performed at [facility]  Report Status PENDING   Incomplete  CULTURE, BLOOD (ROUTINE X 2)     Status: None   Collection Time    11/16/13  5:30 AM      Result Value Ref Range Status   Specimen Description BLOOD LEFT UPPER ARM   Final   Special Requests     Final   Value: BOTTLES DRAWN AEROBIC AND ANAEROBIC 10CC BLUE,2CC RED   Culture  Setup Time     Final   Value: 11/16/2013 08:51     Performed at [facility]   Culture     Final   Value:        BLOOD CULTURE RECEIVED NO GROWTH TO DATE CULTURE WILL BE HELD FOR 5 DAYS BEFORE ISSUING A FINAL NEGATIVE REPORT     Performed at [facility]   Report Status PENDING   Incomplete  CULTURE, BLOOD (ROUTINE X 2)     Status: None   Collection Time    11/16/13  5:38 AM      Result Value Ref Range Status   Specimen Description BLOOD RIGHT UPPER ARM   Final   Special Requests BOTTLES DRAWN  AEROBIC AND ANAEROBIC 10CC EACH   Final   Culture  Setup Time     Final   Value: 11/16/2013 08:51     Performed at [facility]   Culture     Final   Value:        BLOOD CULTURE RECEIVED NO GROWTH TO DATE CULTURE WILL BE HELD FOR 5 DAYS BEFORE ISSUING A FINAL NEGATIVE REPORT     Performed at [facility]   Report Status PENDING   Incomplete  FUNGUS CULTURE, BLOOD     Status: None   Collection Time    11/16/13  9:50 AM      Result Value Ref Range Status   Specimen Description BLOOD LEFT ANTECUBITAL   Final   Special Requests BOTTLES DRAWN AEROBIC AND ANAEROBIC 10CC   Final   Culture     Final   Value: NO FUNGUS ISOLATED;CULTURE IN PROGRESS FOR 7 DAYS     Performed at [facility]   Report Status PENDING   Incomplete  CULTURE, BAL-QUANTITATIVE     Status: None   Collection Time    11/17/13 11:51 AM      Result Value Ref Range Status   Specimen Description BRONCHIAL WASHINGS   Final   Special Requests LUL   Final   Gram Stain     Final   Value: ABUNDANT WBC PRESENT,BOTH PMN AND MONONUCLEAR     NO SQUAMOUS EPITHELIAL CELLS SEEN     FEW GRAM POSITIVE COCCI IN PAIRS     RARE GRAM NEGATIVE RODS     Performed at [place] PENDING   Incomplete   Culture     Final   Value: Culture reincubated for better growth     Performed at [facility]   Report Status PENDING   Incomplete  FUNGUS CULTURE W SMEAR     Status: None   Collection Time    11/17/13 11:51 AM      Result Value Ref Range Status   Specimen Description BRONCHIAL WASHINGS   Final   Special Requests LUL   Final   Fungal Smear     Final   Value: NO YEAST OR FUNGAL ELEMENTS SEEN     Performed at [facility]  Final   Value: CULTURE IN PROGRESS FOR FOUR WEEKS     Performed at [facility]   Report Status PENDING   Incomplete  LEGIONELLA CULTURE     Status: None   Collection Time    11/17/13 11:51 AM      Result Value Ref Range Status    Specimen Description BRONCHIAL WASHINGS   Final   Special Requests NONE   Final   Culture     Final   Value: NO LEGIONELLA ISOLATED, CULTURE IN PROGRESS FOR 5 DAYS     Performed at [facility]   Report Status PENDING   Incomplete    Studies/Results: Dg Chest 2 View  11/18/2013   CLINICAL DATA:  Post fiberoptic bronchoscopy  EXAM: CHEST  2 VIEW  COMPARISON:  CT chest of 11/15/2013 and chest x-ray of the same date  FINDINGS: Multiple pulmonary nodules are scattered diffusely throughout both lungs. There is little change in more opacity within the anterior left upper lobe, worrisome for neoplasm. No pleural effusion is seen. Heart size is stable. No bony abnormality is noted.  IMPRESSION: 1. No change in multiple pulmonary nodules diffusely. 2. No change in left upper lobe opacity with prominent paratracheal soft tissue worrisome for mass and or adenopathy as with lung neoplasm.   Electronically Signed   By: Ivar Drape M.D.   On: 11/18/2013 10:48      Assessment/Plan: Chad George is a 43 y.o. male with  mx pulmonary nodules, mass like lesion in the LUL, lytic lesion in spine  #1 Pulmonary nodules and LUL mass like lesion: --agree with Airborne, and following AFB smears and cultures, consider getting one more in am,  --can see if we can get an XPert MTB probe done --fu on pathology report  #2 Should we consider bx his lytic leision in spine? I assume lung biopsy is going to answer that for [place]  Dr. Linus Salmons back tomorrow.     LOS: 3 days   Alcide Evener 11/18/2013, 1:25 PM

## 2013-11-18 NOTE — Progress Notes (Signed)
PCCM Interval Note  I have reviewed CT scan and case with Dr Nelda Marseille. Agree that this is high risk for malignancy. I have scheduled FOB + EBUS under general anesthesia for 3/13 am at ~ 9:30. Will make him NPO after MN, hold his heparin.   Baltazar Apo, MD, PhD 11/18/2013, 1:32 PM [facility] [phone] or if no answer [phone]

## 2013-11-18 NOTE — Progress Notes (Signed)
[facility] PROGRESS NOTE  Chad George BOF:[id number] DOB: 08-Aug-1971 DOA: 11/15/2013 PCP: Aurora Mask, NP  Assessment/Plan: 1. Pulmonary nodules. Unclear etiology, possibilities includeTB, nontuberculous TB, fungal infection, malignancy. Blood cultures remaining negative thus far. Fungal cultures negative.  Sputum for AFB have been sent, no acid-fast bacilli seen from specimen sent on 11/17/13 and 11/16/2013. HIV non-reactive.  The patient remains off on antimicrobial therapy. He underwent bronchoscopy on 11/17/2013, brushings were performed. Infectious disease was consulted and recommended continuing airborne precautions, observation off of antibiotics. Pathology pending. Case discussed with PCCM, if path unrevealing, will consider endobronchial ultrasound.    Code Status: Full Code Family Communication: I spoke with family Disposition Plan: Continue supportive care   Consultants:  Pulmonary medicine  Infectious disease  Procedures:  Bronchoscopy performed on 11/17/2013  Antibiotics:  None  HPI/Subjective: 43 year-old male with no chronic medical problems presents with newly 3 month history of coughing sweats and subjective fevers and chills. He states that this began around [place] Year's time with productive cough with green sputum. The patient was placed on a seven-day course of antibiotics at the end of January 2015 without significant improvement. His primary care doctor provided him "an inhaler" Claritin, and nasal spray. There was minimal improvement. He continued to cough to the point of having small amounts of blood streaked sputum. He presents with increasing back pain associated with his cough.  Patient states that he has been trying to lose weight, but otherwise is eating well. He denies any recent travels. He has lived in [place] since moving from [place] 18 years ago. Denies any recent travels. Has never lived outside of [place] in [place]. He  denies any recent sick contacts. There's been no history of TB exposure. The patient works as an Clinical biochemist. He denies any headache, nausea, vomiting, diarrhea, abdominal pain, dysuria, unusual rashes, synovitis. He has never had an HIV test. He has a remote history of smoking from which he only smoked for 2 years. There is a remote history of marijuana use but denies any other illegal drug use. CT chest revealed innumerable pulmonary nodules with masslike lesion in the lingula with extensive mediastinal lymphadenopathy. There is also an 8 mm ovoid lesion in the T9 area. The patient states that he has 7 pet rabbits and he has pet fish for which he cleans the fish tank. He denies any open wounds on his hands or arms. He denies any dental problems or dental pain. Patient undergoing bronchoscopy on 11/17/2013, procedure performed by Dr.Yacoub, found to have a patent airway, although inflamed. Brushings performed at the LUL anterior segment     Objective: Filed Vitals:   11/18/13 1300  BP: 120/85  Pulse: 98  Temp: 98.6 F (37 C)  Resp: 19    Intake/Output Summary (Last 24 hours) at 11/18/13 1732 Last data filed at 11/18/13 1300  Gross per 24 hour  Intake    720 ml  Output      0 ml  Net    720 ml   Filed Weights   11/16/13 0026 11/16/13 2050 11/17/13 2100  Weight: 73.8 kg (162 lb 11.2 oz) 74.435 kg (164 lb 1.6 oz) 73.891 kg (162 lb 14.4 oz)    Exam:  General: Pt is alert, follows commands appropriately, not in acute distress  HEENT: No icterus, No thrush, [place]/AT  Cardiovascular: RRR, S1/S2, no rubs, no gallops  Respiratory: Crackles on the right. No wheezing.  Abdomen: Soft/+BS, non tender, non distended, no guarding  Extremities: No  edema, No lymphangitis, No petechiae, No rashes, no synovitis   Data Reviewed: Basic Metabolic Panel:  Recent Labs Lab 11/15/13 1853 11/16/13 0630 11/18/13 0457  NA 140 142 141  K 3.2* 3.9 3.4*  CL 103 105 104  CO2 23 22 22   GLUCOSE 124* 106*  91  BUN 12 11 15   CREATININE 0.72 0.69 0.82  CALCIUM 8.9 9.0 9.1   Liver Function Tests:  Recent Labs Lab 11/16/13 0630  AST 18  ALT 17  ALKPHOS 124*  BILITOT 0.6  PROT 6.4  ALBUMIN 3.7   No results found for this basename: LIPASE, AMYLASE,  in the last 168 hours No results found for this basename: AMMONIA,  in the last 168 hours CBC:  Recent Labs Lab 11/15/13 1853 11/15/13 2112 11/16/13 0630 11/17/13 0710 11/18/13 0457  WBC 6.3 8.1 5.1 5.4 6.6  NEUTROABS  --   --  3.1 2.8  --   HGB 16.0 15.5 14.7 15.3 15.4  HCT 43.4 41.9 40.0 41.8 42.2  MCV 83.5 82.6 83.2 84.3 83.6  PLT 231 240 233 237 235   Cardiac Enzymes: No results found for this basename: CKTOTAL, CKMB, CKMBINDEX, TROPONINI,  in the last 168 hours BNP (last 3 results) No results found for this basename: PROBNP,  in the last 8760 hours CBG: No results found for this basename: GLUCAP,  in the last 168 hours  Recent Results (from the past 240 hour(s))  CULTURE, EXPECTORATED SPUTUM-ASSESSMENT     Status: None   Collection Time    11/16/13  5:03 AM      Result Value Ref Range Status   Specimen Description SPUTUM   Final   Special Requests NONE   Final   Sputum evaluation     Final   Value: MICROSCOPIC FINDINGS SUGGEST THAT THIS SPECIMEN IS NOT REPRESENTATIVE OF LOWER RESPIRATORY SECRETIONS. PLEASE RECOLLECT.     RESULT CALLED TO, READ BACK BY AND VERIFIED WITHBarnabas Harries RN [phone] 0630 GREEN R   Report Status 11/16/2013 FINAL   Final  AFB CULTURE WITH SMEAR     Status: None   Collection Time    11/16/13  5:05 AM      Result Value Ref Range Status   Specimen Description SPUTUM   Final   Special Requests NONE   Final   ACID FAST SMEAR     Final   Value: NO ACID FAST BACILLI SEEN     Performed at [facility]   Culture     Final   Value: CULTURE WILL BE EXAMINED FOR 6 WEEKS BEFORE ISSUING A FINAL REPORT     Performed at [facility]   Report Status PENDING   Incomplete  CULTURE, BLOOD  (ROUTINE X 2)     Status: None   Collection Time    11/16/13  5:30 AM      Result Value Ref Range Status   Specimen Description BLOOD LEFT UPPER ARM   Final   Special Requests     Final   Value: BOTTLES DRAWN AEROBIC AND ANAEROBIC 10CC BLUE,2CC RED   Culture  Setup Time     Final   Value: 11/16/2013 08:51     Performed at [facility]   Culture     Final   Value:        BLOOD CULTURE RECEIVED NO GROWTH TO DATE CULTURE WILL BE HELD FOR 5 DAYS BEFORE ISSUING A FINAL NEGATIVE REPORT     Performed at [facility]   Report Status PENDING   Incomplete  CULTURE, BLOOD (ROUTINE X 2)     Status: None   Collection Time    11/16/13  5:38 AM      Result Value Ref Range Status   Specimen Description BLOOD RIGHT UPPER ARM   Final   Special Requests BOTTLES DRAWN AEROBIC AND ANAEROBIC 10CC EACH   Final   Culture  Setup Time     Final   Value: 11/16/2013 08:51     Performed at [facility]   Culture     Final   Value:        BLOOD CULTURE RECEIVED NO GROWTH TO DATE CULTURE WILL BE HELD FOR 5 DAYS BEFORE ISSUING A FINAL NEGATIVE REPORT     Performed at [facility]   Report Status PENDING   Incomplete  FUNGUS CULTURE, BLOOD     Status: None   Collection Time    11/16/13  9:50 AM      Result Value Ref Range Status   Specimen Description BLOOD LEFT ANTECUBITAL   Final   Special Requests BOTTLES DRAWN AEROBIC AND ANAEROBIC 10CC   Final   Culture     Final   Value: NO FUNGUS ISOLATED;CULTURE IN PROGRESS FOR 7 DAYS     Performed at [facility]   Report Status PENDING   Incomplete  AFB CULTURE WITH SMEAR     Status: None   Collection Time    11/17/13 11:37 AM      Result Value Ref Range Status   Specimen Description BRONCHIAL WASHINGS   Final   Special Requests LUL   Final   ACID FAST SMEAR     Final   Value: NO ACID FAST BACILLI SEEN     Performed at [facility]   Culture     Final   Value: CULTURE WILL BE EXAMINED FOR 6 WEEKS BEFORE ISSUING  A FINAL REPORT     Performed at [facility]   Report Status PENDING   Incomplete  CULTURE, BAL-QUANTITATIVE     Status: None   Collection Time    11/17/13 11:51 AM      Result Value Ref Range Status   Specimen Description BRONCHIAL WASHINGS   Final   Special Requests LUL   Final   Gram Stain     Final   Value: ABUNDANT WBC PRESENT,BOTH PMN AND MONONUCLEAR     NO SQUAMOUS EPITHELIAL CELLS SEEN     FEW GRAM POSITIVE COCCI IN PAIRS     RARE GRAM NEGATIVE RODS     Performed at [place] PENDING   Incomplete   Culture     Final   Value: Culture reincubated for better growth     Performed at [facility]   Report Status PENDING   Incomplete  FUNGUS CULTURE W SMEAR     Status: None   Collection Time    11/17/13 11:51 AM      Result Value Ref Range Status   Specimen Description BRONCHIAL WASHINGS   Final   Special Requests LUL   Final   Fungal Smear     Final   Value: NO YEAST OR FUNGAL ELEMENTS SEEN     Performed at [facility]   Culture     Final   Value: CULTURE IN PROGRESS FOR FOUR WEEKS     Performed at [facility]   Report Status  PENDING   Incomplete  LEGIONELLA CULTURE     Status: None   Collection Time    11/17/13 11:51 AM      Result Value Ref Range Status   Specimen Description BRONCHIAL WASHINGS   Final   Special Requests NONE   Final   Culture     Final   Value: NO LEGIONELLA ISOLATED, CULTURE IN PROGRESS FOR 5 DAYS     Performed at [facility]   Report Status PENDING   Incomplete     Studies: Dg Chest 2 View  11/18/2013   CLINICAL DATA:  Post fiberoptic bronchoscopy  EXAM: CHEST  2 VIEW  COMPARISON:  CT chest of 11/15/2013 and chest x-ray of the same date  FINDINGS: Multiple pulmonary nodules are scattered diffusely throughout both lungs. There is little change in more opacity within the anterior left upper lobe, worrisome for neoplasm. No pleural effusion is seen. Heart size is stable. No bony  abnormality is noted.  IMPRESSION: 1. No change in multiple pulmonary nodules diffusely. 2. No change in left upper lobe opacity with prominent paratracheal soft tissue worrisome for mass and or adenopathy as with lung neoplasm.   Electronically Signed   By: Ivar Drape M.D.   On: 11/18/2013 10:48    Scheduled Meds: . guaiFENesin  600 mg Oral BID   Continuous Infusions: . sodium chloride 20 mL/hr at 11/17/13 1045    Principal Problem:   Left pulmonary infiltrate on CXR Active Problems:   ALLERGIC RHINITIS   Pulmonary nodules/lesions, multiple   Hyperglycemia   Dyslipidemia    Time spent: 30  min    Kelvin Cellar  [facility] Pager [phone]. If 7PM-7AM, please contact night-coverage at [email or web address], password [facility] 11/18/2013, 5:32 PM  LOS: 3 days

## 2013-11-19 ENCOUNTER — Inpatient Hospital Stay (HOSPITAL_COMMUNITY): Payer: Medicaid Other | Admitting: Anesthesiology

## 2013-11-19 ENCOUNTER — Encounter (HOSPITAL_COMMUNITY)
Admission: EM | Disposition: A | Discharge status: Home / Self Care | Payer: Self-pay | Source: Home / Self Care | Attending: Internal Medicine

## 2013-11-19 ENCOUNTER — Encounter (HOSPITAL_COMMUNITY): Payer: Medicaid Other | Admitting: Anesthesiology

## 2013-11-19 ENCOUNTER — Encounter (HOSPITAL_COMMUNITY): Payer: Self-pay | Admitting: Anesthesiology

## 2013-11-19 DIAGNOSIS — R590 Localized enlarged lymph nodes: Secondary | ICD-10-CM | POA: Diagnosis present

## 2013-11-19 DIAGNOSIS — R599 Enlarged lymph nodes, unspecified: Secondary | ICD-10-CM

## 2013-11-19 HISTORY — PX: VIDEO BRONCHOSCOPY WITH ENDOBRONCHIAL ULTRASOUND: SHX6177

## 2013-11-19 LAB — CBC
HCT: 40.3 % (ref 39.0–52.0)
Hemoglobin: 14.9 g/dL (ref 13.0–17.0)
MCH: 30.7 pg (ref 26.0–34.0)
MCHC: 37 g/dL — AB (ref 30.0–36.0)
MCV: 82.9 fL (ref 78.0–100.0)
Platelets: 227 10*3/uL (ref 150–400)
RBC: 4.86 MIL/uL (ref 4.22–5.81)
RDW: 11.8 % (ref 11.5–15.5)
WBC: 5.4 10*3/uL (ref 4.0–10.5)

## 2013-11-19 LAB — APTT: APTT: 31 s (ref 24–37)

## 2013-11-19 LAB — COCCIDIOIDES ANTIBODIES: Coccidioides Ab CF: <1:2 {titer}

## 2013-11-19 LAB — PROTIME-INR
INR: 1.02 (ref 0.00–1.49)
PROTHROMBIN TIME: 13.2 s (ref 11.6–15.2)

## 2013-11-19 SURGERY — BRONCHOSCOPY, WITH EBUS
Anesthesia: General | Site: Bronchus

## 2013-11-19 MED ORDER — HYDROMORPHONE HCL PF 1 MG/ML IJ SOLN: 0.2500 mg | INTRAMUSCULAR | Status: DC | PRN

## 2013-11-19 MED ORDER — LIDOCAINE HCL (CARDIAC) 20 MG/ML IV SOLN
INTRAVENOUS | Status: DC | PRN
  Administered 2013-11-19: 100 mg via INTRAVENOUS

## 2013-11-19 MED ORDER — LIDOCAINE HCL (CARDIAC) 20 MG/ML IV SOLN
INTRAVENOUS | Status: AC
  Filled 2013-11-19: qty 5

## 2013-11-19 MED ORDER — MIDAZOLAM HCL 5 MG/5ML IJ SOLN
INTRAMUSCULAR | Status: DC | PRN
  Administered 2013-11-19: 2 mg via INTRAVENOUS

## 2013-11-19 MED ORDER — PROPOFOL 10 MG/ML IV BOLUS
INTRAVENOUS | Status: AC
  Filled 2013-11-19: qty 20

## 2013-11-19 MED ORDER — PROPOFOL 10 MG/ML IV BOLUS
INTRAVENOUS | Status: DC | PRN
  Administered 2013-11-19: 50 mg via INTRAVENOUS
  Administered 2013-11-19: 200 mg via INTRAVENOUS
  Administered 2013-11-19: 50 mg via INTRAVENOUS

## 2013-11-19 MED ORDER — SUCCINYLCHOLINE CHLORIDE 20 MG/ML IJ SOLN
INTRAMUSCULAR | Status: DC | PRN
  Administered 2013-11-19: 100 mg via INTRAVENOUS

## 2013-11-19 MED ORDER — FENTANYL CITRATE 0.05 MG/ML IJ SOLN
INTRAMUSCULAR | Status: AC
  Filled 2013-11-19: qty 5

## 2013-11-19 MED ORDER — SODIUM CHLORIDE 0.9 % IJ SOLN
2000.0000 ug | INTRAVENOUS | Status: DC | PRN
  Administered 2013-11-19: .5 ug/kg/min via INTRAVENOUS

## 2013-11-19 MED ORDER — LACTATED RINGERS IV SOLN: INTRAVENOUS | Status: DC

## 2013-11-19 MED ORDER — FENTANYL CITRATE 0.05 MG/ML IJ SOLN
INTRAMUSCULAR | Status: DC | PRN
  Administered 2013-11-19 (×4): 50 ug via INTRAVENOUS

## 2013-11-19 MED ORDER — ONDANSETRON HCL 4 MG/2ML IJ SOLN
INTRAMUSCULAR | Status: DC | PRN
  Administered 2013-11-19: 4 mg via INTRAVENOUS

## 2013-11-19 MED ORDER — PROPOFOL 10 MG/ML IV BOLUS
INTRAVENOUS | Status: AC
Start: 2013-11-19 — End: 2013-11-19
  Filled 2013-11-19: qty 20

## 2013-11-19 MED ORDER — MIDAZOLAM HCL 2 MG/2ML IJ SOLN
INTRAMUSCULAR | Status: AC
  Filled 2013-11-19: qty 2

## 2013-11-19 MED ORDER — SODIUM CHLORIDE 0.9 % IR SOLN
Status: DC | PRN
  Administered 2013-11-19: 1000 mL

## 2013-11-19 MED ORDER — ONDANSETRON HCL 4 MG/2ML IJ SOLN
INTRAMUSCULAR | Status: AC
  Filled 2013-11-19: qty 2

## 2013-11-19 MED ORDER — LACTATED RINGERS IV SOLN
INTRAVENOUS | Status: DC | PRN
  Administered 2013-11-19 (×2): via INTRAVENOUS

## 2013-11-19 SURGICAL SUPPLY — 26 items
BRUSH CYTOL CELLEBRITY 1.5X140 (MISCELLANEOUS) ×2 IMPLANT
CANISTER SUCTION 2500CC (MISCELLANEOUS) ×3 IMPLANT
CONT SPEC 4OZ CLIKSEAL STRL BL (MISCELLANEOUS) ×5 IMPLANT
COVER TABLE BACK 60X90 (DRAPES) ×3 IMPLANT
FORCEPS BIOP RJ4 1.8 (CUTTING FORCEPS) IMPLANT
FORCEPS RADIAL JAW LRG 4 PULM (INSTRUMENTS) IMPLANT
GLOVE BIOGEL M STRL SZ7.5 (GLOVE) ×3 IMPLANT
GOWN STRL REUS W/ TWL LRG LVL3 (GOWN DISPOSABLE) ×1 IMPLANT
GOWN STRL REUS W/TWL LRG LVL3 (GOWN DISPOSABLE) ×3
KIT ROOM TURNOVER OR (KITS) ×3 IMPLANT
MARKER SKIN DUAL TIP RULER LAB (MISCELLANEOUS) ×3 IMPLANT
NDL BIOPSY TRANSBRONCH 21G (NEEDLE) IMPLANT
NEEDLE BIOPSY TRANSBRONCH 21G (NEEDLE) ×3 IMPLANT
NEEDLE SYS SONOTIP II EBUSTBNA (NEEDLE) ×4 IMPLANT
NS IRRIG 1000ML POUR BTL (IV SOLUTION) ×3 IMPLANT
OIL SILICONE PENTAX (PARTS (SERVICE/REPAIRS)) ×2 IMPLANT
PAD ARMBOARD 7.5X6 YLW CONV (MISCELLANEOUS) ×6 IMPLANT
RADIAL JAW LRG 4 PULMONARY (INSTRUMENTS) ×2
SPONGE GAUZE 4X4 12PLY (GAUZE/BANDAGES/DRESSINGS) ×2 IMPLANT
SYR 20CC LL (SYRINGE) ×3 IMPLANT
SYR 20ML ECCENTRIC (SYRINGE) ×4 IMPLANT
SYR 5ML LUER SLIP (SYRINGE) ×1 IMPLANT
TOWEL OR 17X24 6PK STRL BLUE (TOWEL DISPOSABLE) ×3 IMPLANT
TRAP SPECIMEN MUCOUS 40CC (MISCELLANEOUS) ×3 IMPLANT
TUBE CONNECTING 12'X1/4 (SUCTIONS) ×1
TUBE CONNECTING 12X1/4 (SUCTIONS) ×3 IMPLANT

## 2013-11-19 NOTE — Anesthesia Postprocedure Evaluation (Signed)
  Anesthesia Post-op Note  Patient: Chad George  Procedure(s) Performed: Procedure(s): VIDEO BRONCHOSCOPY WITH ENDOBRONCHIAL ULTRASOUND (N/A)  Patient Location: PACU  Anesthesia Type:General  Level of Consciousness: awake  Airway and Oxygen Therapy: Patient Spontanous Breathing  Post-op Pain: mild  Post-op Assessment: Post-op Vital signs reviewed  Post-op Vital Signs: Reviewed  Complications: No apparent anesthesia complications

## 2013-11-19 NOTE — Progress Notes (Signed)
Patient not seen, was at procedure.  Awaiting results of BAL, ultrasound (endobronchial).    Dr. Baxter Flattery is available over the weekend if needed, otherwise I will follow up on Monday.

## 2013-11-19 NOTE — Progress Notes (Signed)
Patient with negative AFB smear from bronchoscopy 3/11.  Will d/c airborne isolation.

## 2013-11-19 NOTE — Progress Notes (Signed)
PULMONARY / CRITICAL CARE MEDICINE   Name: Chad George MRN: [id number] DOB: 09-20-1970    ADMISSION DATE:  11/15/2013 CONSULTATION DATE:  11/16/2013  REFERRING MD :  Posey Pronto PRIMARY SERVICE: [facility]  CHIEF COMPLAINT:  Cough  BRIEF PATIENT DESCRIPTION: 43 y/o male was admitted on 3/9 to [facility] with several weeks of cough and green sputum production.  He was found to have a left upper lung mass with innumerable pulmonary nodules.  SIGNIFICANT EVENTS / STUDIES:  3/9 CT chest > mass like lesion left upper lobe extending into lingula with innumerable tiny bilateral pulmonary nodules and mediastinal lymphadenopathy; T9 lytic lesion noted 3/11 FOB per Dr. Nelda Marseille. Poor visualization due to patient movement.    LINES / TUBES:   CULTURES: 3/10 blood >> 3/10 sputum >> 3/10 sputum AFB >>none 3/11 fob path>> ANTIBIOTICS: Off Per ID  HISTORY OF PRESENT ILLNESS:  43 y/o male was admitted on 3/9 to [facility] with several weeks of cough and green sputum production.  He was found to have a left upper lung mass with innumerable pulmonary nodules. He says that he developed the cough with green sputum production in January and was treated for one week with ceftriaxone x1 and moxifloxacin for a week on January 23.  He says that he felt like the cough improved somewhat initially, but throughout February he noticed increasing cough, mucus production (green), and dyspnea.  He had ocassional mild sweats at night (just forehead) and rare chills but doesn't recall a fever or rigors.  His wife has been sick in the last few days.  He works in a Air traffic controller.  He has lost weight intentionally due to high cholesterol.  He has not noticed any testicular lesions or masses.  SUBJECTIVE:  Awake and alert, sitting up eating.  VITAL SIGNS: Temp:  [97.7 F (36.5 C)-98.7 F (37.1 C)] 98.1 F (36.7 C) (03/13 1329) Pulse Rate:  [70-94] 86 (03/13 1329) Resp:  [13-22] 16 (03/13 1329) BP: (98-125)/(57-81) 98/57 mmHg (03/13  1329) SpO2:  [95 %-98 %] 97 % (03/13 1329) HEMODYNAMICS:   VENTILATOR SETTINGS:   INTAKE / OUTPUT: Intake/Output     03/12 0701 - 03/13 0700 03/13 0701 - 03/14 0700   P.O. 720 30   I.V. (mL/kg)  1000 (13.5)   Total Intake(mL/kg) 720 (9.7) 1030 (13.9)   Net +720 +1030        Urine Occurrence 2 x    Stool Occurrence      PHYSICAL EXAMINATION: Gen: well appearing, no acute distress, sitting in chair HEENT: No LAN/JVD PULM: decreased bs bases CV: RRR, no mgr, no JVD AB: BS+, soft, nontender, no hsm Ext: warm, no edema, no clubbing, no cyanosis Derm: no rash or skin breakdown Neuro: intact  LABS:  CBC  Recent Labs Lab 11/17/13 0710 11/18/13 0457 11/19/13 0639  WBC 5.4 6.6 5.4  HGB 15.3 15.4 14.9  HCT 41.8 42.2 40.3  PLT 237 235 227   Coag's  Recent Labs Lab 11/16/13 1230 11/19/13 0639  APTT  --  31  INR 1.09 1.02   BMET  Recent Labs Lab 11/15/13 1853 11/16/13 0630 11/18/13 0457  NA 140 142 141  K 3.2* 3.9 3.4*  CL 103 105 104  CO2 23 22 22   BUN 12 11 15   CREATININE 0.72 0.69 0.82  GLUCOSE 124* 106* 91   Electrolytes  Recent Labs Lab 11/15/13 1853 11/16/13 0630 11/18/13 0457  CALCIUM 8.9 9.0 9.1   Sepsis Markers No results found for this  basename: LATICACIDVEN, PROCALCITON, O2SATVEN,  in the last 168 hours  ABG No results found for this basename: PHART, PCO2ART, PO2ART,  in the last 168 hours  Liver Enzymes  Recent Labs Lab 11/16/13 0630  AST 18  ALT 17  ALKPHOS 124*  BILITOT 0.6  ALBUMIN 3.7   Cardiac Enzymes No results found for this basename: TROPONINI, PROBNP,  in the last 168 hours  Glucose No results found for this basename: GLUCAP,  in the last 168 hours  Imaging Dg Chest 2 View  11/18/2013   CLINICAL DATA:  Post fiberoptic bronchoscopy  EXAM: CHEST  2 VIEW  COMPARISON:  CT chest of 11/15/2013 and chest x-ray of the same date  FINDINGS: Multiple pulmonary nodules are scattered diffusely throughout both lungs. There is  little change in more opacity within the anterior left upper lobe, worrisome for neoplasm. No pleural effusion is seen. Heart size is stable. No bony abnormality is noted.  IMPRESSION: 1. No change in multiple pulmonary nodules diffusely. 2. No change in left upper lobe opacity with prominent paratracheal soft tissue worrisome for mass and or adenopathy as with lung neoplasm.   Electronically Signed   By: Ivar Drape M.D.   On: 11/18/2013 10:48   CXR: left lung consolidation with innumerable pulm nodules  ASSESSMENT / PLAN:  PULMONARY A: Subacute cough with mucus production and a left upper lobe consolidation vs mass and innumerable pulmonary nodules.  I am most concerned about possible malignancy (testicular vs primary lung) vs an atypical infection like a fungal infection vs less likely sarcoid.  Radiographically this does not look like TB to me, but I value our ID colleagues opinion here. P:   - Rebronch today with an EBUS for LAN and await path. - AFB negative, will defer treatment to ID. - F/U on culture.  INFECTIOUS A:  Post obstructive pneumonia vs atypical infection? See above P:   - Agree with holding antibiotics for now - ID consult appreciated.  Note: EBUS today and will f/u on path.  Rush Farmer, M.D. [facility] Pulmonary/Critical Care Medicine. Pager: [phone]. After hours pager: [phone].

## 2013-11-19 NOTE — Transfer of Care (Signed)
Immediate Anesthesia Transfer of Care Note  Patient: Chad George  Procedure(s) Performed: Procedure(s): VIDEO BRONCHOSCOPY WITH ENDOBRONCHIAL ULTRASOUND (N/A)  Patient Location: PACU  Anesthesia Type:General  Level of Consciousness: awake, alert , oriented and patient cooperative  Airway & Oxygen Therapy: Patient Spontanous Breathing and Patient connected to nasal cannula oxygen  Post-op Assessment: Report given to PACU RN, Post -op Vital signs reviewed and stable and Patient moving all extremities  Post vital signs: Reviewed and stable  Complications: No apparent anesthesia complications

## 2013-11-19 NOTE — Progress Notes (Signed)
[facility] PROGRESS NOTE  Chad George YNW:[id number] DOB: 1971-05-11 DOA: 11/15/2013 PCP: Aurora Mask, NP  Assessment/Plan: 1. Pulmonary nodules. Unclear etiology, possibilities includeTB, nontuberculous TB, fungal infection, malignancy. Blood cultures remaining negative thus far. Fungal cultures negative.  Sputum for AFB have been sent, no acid-fast bacilli seen from specimen sent on 11/17/13 and 11/16/2013. HIV non-reactive.  The patient remains off on antimicrobial therapy. He underwent bronchoscopy on 11/17/2013, brushings were performed. Infectious disease was consulted and recommended continuing airborne precautions, observation off of antibiotics. Pathology pending. Case discussed with PCCM. Patient undergoing endobronchial ultrasound on 11/19/2013. Cytology pending  Code Status: Full Code Family Communication: I spoke with family Disposition Plan: Continue supportive care   Consultants:  Pulmonary medicine  Infectious disease  Procedures:  Bronchoscopy performed on 11/17/2013       Endobronchial ultrasound on 11/19/2013.  Antibiotics:  None  HPI/Subjective: 43 year-old male with no chronic medical problems presents with newly 3 month history of coughing sweats and subjective fevers and chills. He states that this began around [place] Year's time with productive cough with green sputum. The patient was placed on a seven-day course of antibiotics at the end of January 2015 without significant improvement. His primary care doctor provided him "an inhaler" Claritin, and nasal spray. There was minimal improvement. He continued to cough to the point of having small amounts of blood streaked sputum. He presents with increasing back pain associated with his cough.  Patient states that he has been trying to lose weight, but otherwise is eating well. He denies any recent travels. He has lived in [place] since moving from [place] 18 years ago. Denies any recent travels. Has never  lived outside of [place] in [place]. He denies any recent sick contacts. There's been no history of TB exposure. The patient works as an Clinical biochemist. He denies any headache, nausea, vomiting, diarrhea, abdominal pain, dysuria, unusual rashes, synovitis. He has never had an HIV test. He has a remote history of smoking from which he only smoked for 2 years. There is a remote history of marijuana use but denies any other illegal drug use. CT chest revealed innumerable pulmonary nodules with masslike lesion in the lingula with extensive mediastinal lymphadenopathy. There is also an 8 mm ovoid lesion in the T9 area. The patient states that he has 7 pet rabbits and he has pet fish for which he cleans the fish tank. He denies any open wounds on his hands or arms. He denies any dental problems or dental pain. Patient undergoing bronchoscopy on 11/17/2013, procedure performed by Dr.Yacoub, found to have a patent airway, although inflamed. Brushings performed at the LUL anterior segment. On 11/19/2013 patient undergoing endobronchial ultrasound, procedure performed by Dr.Byrum. Patient tolerated procedure well there were no immediate complications.     Objective: Filed Vitals:   11/19/13 1719  BP: 102/71  Pulse: 89  Temp: 98.6 F (37 C)  Resp: 18    Intake/Output Summary (Last 24 hours) at 11/19/13 1804 Last data filed at 11/19/13 1200  Gross per 24 hour  Intake   1030 ml  Output      0 ml  Net   1030 ml   Filed Weights   11/16/13 0026 11/16/13 2050 11/17/13 2100  Weight: 73.8 kg (162 lb 11.2 oz) 74.435 kg (164 lb 1.6 oz) 73.891 kg (162 lb 14.4 oz)    Exam:  General: Pt is alert, follows commands appropriately, not in acute distress  HEENT: No icterus, No thrush, /AT  Cardiovascular:  RRR, S1/S2, no rubs, no gallops  Respiratory: Crackles on the right. No wheezing.  Abdomen: Soft/+BS, non tender, non distended, no guarding  Extremities: No edema, No lymphangitis, No petechiae,  No rashes, no synovitis   Data Reviewed: Basic Metabolic Panel:  Recent Labs Lab 11/15/13 1853 11/16/13 0630 11/18/13 0457  NA 140 142 141  K 3.2* 3.9 3.4*  CL 103 105 104  CO2 23 22 22   GLUCOSE 124* 106* 91  BUN 12 11 15   CREATININE 0.72 0.69 0.82  CALCIUM 8.9 9.0 9.1   Liver Function Tests:  Recent Labs Lab 11/16/13 0630  AST 18  ALT 17  ALKPHOS 124*  BILITOT 0.6  PROT 6.4  ALBUMIN 3.7   No results found for this basename: LIPASE, AMYLASE,  in the last 168 hours No results found for this basename: AMMONIA,  in the last 168 hours CBC:  Recent Labs Lab 11/15/13 2112 11/16/13 0630 11/17/13 0710 11/18/13 0457 11/19/13 0639  WBC 8.1 5.1 5.4 6.6 5.4  NEUTROABS  --  3.1 2.8  --   --   HGB 15.5 14.7 15.3 15.4 14.9  HCT 41.9 40.0 41.8 42.2 40.3  MCV 82.6 83.2 84.3 83.6 82.9  PLT 240 233 237 235 227   Cardiac Enzymes: No results found for this basename: CKTOTAL, CKMB, CKMBINDEX, TROPONINI,  in the last 168 hours BNP (last 3 results) No results found for this basename: PROBNP,  in the last 8760 hours CBG: No results found for this basename: GLUCAP,  in the last 168 hours  Recent Results (from the past 240 hour(s))  CULTURE, EXPECTORATED SPUTUM-ASSESSMENT     Status: None   Collection Time    11/16/13  5:03 AM      Result Value Ref Range Status   Specimen Description SPUTUM   Final   Special Requests NONE   Final   Sputum evaluation     Final   Value: MICROSCOPIC FINDINGS SUGGEST THAT THIS SPECIMEN IS NOT REPRESENTATIVE OF LOWER RESPIRATORY SECRETIONS. PLEASE RECOLLECT.     RESULT CALLED TO, READ BACK BY AND VERIFIED WITHBarnabas Harries RN [phone] 0630 GREEN R   Report Status 11/16/2013 FINAL   Final  AFB CULTURE WITH SMEAR     Status: None   Collection Time    11/16/13  5:05 AM      Result Value Ref Range Status   Specimen Description SPUTUM   Final   Special Requests NONE   Final   ACID FAST SMEAR     Final   Value: NO ACID FAST BACILLI SEEN      Performed at [facility]   Culture     Final   Value: CULTURE WILL BE EXAMINED FOR 6 WEEKS BEFORE ISSUING A FINAL REPORT     Performed at [facility]   Report Status PENDING   Incomplete  CULTURE, BLOOD (ROUTINE X 2)     Status: None   Collection Time    11/16/13  5:30 AM      Result Value Ref Range Status   Specimen Description BLOOD LEFT UPPER ARM   Final   Special Requests     Final   Value: BOTTLES DRAWN AEROBIC AND ANAEROBIC 10CC BLUE,2CC RED   Culture  Setup Time     Final   Value: 11/16/2013 08:51     Performed at [facility]   Culture     Final   Value:        BLOOD  CULTURE RECEIVED NO GROWTH TO DATE CULTURE WILL BE HELD FOR 5 DAYS BEFORE ISSUING A FINAL NEGATIVE REPORT     Performed at [facility]   Report Status PENDING   Incomplete  CULTURE, BLOOD (ROUTINE X 2)     Status: None   Collection Time    11/16/13  5:38 AM      Result Value Ref Range Status   Specimen Description BLOOD RIGHT UPPER ARM   Final   Special Requests BOTTLES DRAWN AEROBIC AND ANAEROBIC 10CC EACH   Final   Culture  Setup Time     Final   Value: 11/16/2013 08:51     Performed at [facility]   Culture     Final   Value:        BLOOD CULTURE RECEIVED NO GROWTH TO DATE CULTURE WILL BE HELD FOR 5 DAYS BEFORE ISSUING A FINAL NEGATIVE REPORT     Performed at [facility]   Report Status PENDING   Incomplete  FUNGUS CULTURE, BLOOD     Status: None   Collection Time    11/16/13  9:50 AM      Result Value Ref Range Status   Specimen Description BLOOD LEFT ANTECUBITAL   Final   Special Requests BOTTLES DRAWN AEROBIC AND ANAEROBIC 10CC   Final   Culture     Final   Value: NO FUNGUS ISOLATED;CULTURE IN PROGRESS FOR 7 DAYS     Performed at [facility]   Report Status PENDING   Incomplete  AFB CULTURE WITH SMEAR     Status: None   Collection Time    11/17/13 11:37 AM      Result Value Ref Range Status   Specimen Description BRONCHIAL WASHINGS    Final   Special Requests LUL   Final   ACID FAST SMEAR     Final   Value: NO ACID FAST BACILLI SEEN     Performed at [facility]   Culture     Final   Value: CULTURE WILL BE EXAMINED FOR 6 WEEKS BEFORE ISSUING A FINAL REPORT     Performed at [facility]   Report Status PENDING   Incomplete  CULTURE, BAL-QUANTITATIVE     Status: None   Collection Time    11/17/13 11:51 AM      Result Value Ref Range Status   Specimen Description BRONCHIAL WASHINGS   Final   Special Requests LUL   Final   Gram Stain     Final   Value: ABUNDANT WBC PRESENT,BOTH PMN AND MONONUCLEAR     NO SQUAMOUS EPITHELIAL CELLS SEEN     FEW GRAM POSITIVE COCCI IN PAIRS     RARE GRAM NEGATIVE RODS     Performed at [facility] PENDING   Incomplete   Culture     Final   Value: Culture reincubated for better growth     Performed at [facility]   Report Status PENDING   Incomplete  FUNGUS CULTURE W SMEAR     Status: None   Collection Time    11/17/13 11:51 AM      Result Value Ref Range Status   Specimen Description BRONCHIAL WASHINGS   Final   Special Requests LUL   Final   Fungal Smear     Final   Value: NO YEAST OR FUNGAL ELEMENTS SEEN     Performed at [facility]  Final   Value: CULTURE IN PROGRESS FOR FOUR WEEKS     Performed at [facility]   Report Status PENDING   Incomplete  LEGIONELLA CULTURE     Status: None   Collection Time    11/17/13 11:51 AM      Result Value Ref Range Status   Specimen Description BRONCHIAL WASHINGS   Final   Special Requests NONE   Final   Culture     Final   Value: NO LEGIONELLA ISOLATED, CULTURE IN PROGRESS FOR 5 DAYS     Performed at [facility]   Report Status PENDING   Incomplete     Studies: Dg Chest 2 View  11/18/2013   CLINICAL DATA:  Post fiberoptic bronchoscopy  EXAM: CHEST  2 VIEW  COMPARISON:  CT chest of 11/15/2013 and chest x-ray of the same date  FINDINGS: Multiple  pulmonary nodules are scattered diffusely throughout both lungs. There is little change in more opacity within the anterior left upper lobe, worrisome for neoplasm. No pleural effusion is seen. Heart size is stable. No bony abnormality is noted.  IMPRESSION: 1. No change in multiple pulmonary nodules diffusely. 2. No change in left upper lobe opacity with prominent paratracheal soft tissue worrisome for mass and or adenopathy as with lung neoplasm.   Electronically Signed   By: Ivar Drape M.D.   On: 11/18/2013 10:48    Scheduled Meds: . guaiFENesin  600 mg Oral BID   Continuous Infusions: . sodium chloride 20 mL/hr at 11/17/13 1045  . lactated ringers      Principal Problem:   Left pulmonary infiltrate on CXR Active Problems:   ALLERGIC RHINITIS   Pulmonary nodules/lesions, multiple   Hyperglycemia   Dyslipidemia   Mediastinal lymphadenopathy    Time spent: 30  min    Kelvin Cellar  [facility] Pager [phone]. If 7PM-7AM, please contact night-coverage at [email or web address], password [facility] 11/19/2013, 6:04 PM  LOS: 4 days

## 2013-11-19 NOTE — Anesthesia Preprocedure Evaluation (Addendum)
Anesthesia Evaluation  Patient identified by MRN, date of birth, ID band Patient awake    Reviewed: Allergy & Precautions, H&P , NPO status , Patient's Chart, lab work & pertinent test results  Airway Mallampati: II      Dental   Pulmonary  breath sounds clear to auscultation        Cardiovascular negative cardio ROS  Rhythm:Regular Rate:Normal     Neuro/Psych    GI/Hepatic negative GI ROS, Neg liver ROS,   Endo/Other  negative endocrine ROS  Renal/GU negative Renal ROS     Musculoskeletal   Abdominal   Peds  Hematology   Anesthesia Other Findings   Reproductive/Obstetrics                          Anesthesia Physical Anesthesia Plan  ASA: III  Anesthesia Plan: General   Post-op Pain Management:    Induction: Intravenous  Airway Management Planned: Oral ETT  Additional Equipment:   Intra-op Plan:   Post-operative Plan: Extubation in OR  Informed Consent: I have reviewed the patients History and Physical, chart, labs and discussed the procedure including the risks, benefits and alternatives for the proposed anesthesia with the patient or authorized representative who has indicated his/her understanding and acceptance.   Dental advisory given  Plan Discussed with: CRNA, Anesthesiologist and Surgeon  Anesthesia Plan Comments:         Anesthesia Quick Evaluation

## 2013-11-19 NOTE — Op Note (Signed)
Video Bronchoscopy with Endobronchial Ultrasound Procedure Note  Date of Operation: 11/19/2013  Pre-op Diagnosis: LUL mass with mediastinal lymphadenopathy  Post-op Diagnosis: same  Surgeon: Baltazar Apo  Assistants: none  Anesthesia: General endotracheal anesthesia  Operation: Flexible video fiberoptic bronchoscopy with endobronchial ultrasound and biopsies.  Estimated Blood Loss: 53IR  Complications: none apparent  Indications and History: Chad George is a 43 y.o. male with a LUL mass and mediastinal LAD on CT chest. Recommendation was made to pursue tissue dx with EBUS and biopsies.  The risks, benefits, complications, treatment options and expected outcomes were discussed with the patient.  The possibilities of pneumothorax, pneumonia, reaction to medication, pulmonary aspiration, perforation of a viscus, bleeding, failure to diagnose a condition and creating a complication requiring transfusion or operation were discussed with the patient who freely signed the consent.    Description of Procedure: The patient was examined in the preoperative area and history and data from the preprocedure consultation were reviewed. It was deemed appropriate to proceed.  The patient was taken to OR 10, identified as Chad George and the procedure verified as Flexible Video Fiberoptic Bronchoscopy.  A Time Out was held and the above information confirmed. General anesthesia was initiated and the patient  was orally intubated. The video fiberoptic bronchoscope was introduced via the endotracheal tube and a general inspection was performed which showed normal R sided airways. The LUL and lingular airways were edematous and hypopigmented. Endobronchial brushings and endobronchial biopsies were performed in the LUL for cytology and pathology. The standard scope was then withdrawn and the endobronchial ultrasound was used to identify and characterize the peritracheal, hilar and bronchial lymph nodes.  Inspection showed the LUL mass as well as station 7, 4R and 4L nodes. Using real-time ultrasound guidance Wang needle biopsies were take from each of these locations and were sent for cytology. The patient tolerated the procedure well without apparent complications. There was no significant blood loss. The bronchoscope was withdrawn. Anesthesia was reversed and the patient was taken to the PACU for recovery.   Samples: 1. LUL endobronchial brushings 2. LUL endobronchial biopsies 3. Wang needle biopsies from LUL mass 4. Wang needle biopsies from 7 node.  5. Wang needle biopsies from 4R node 6. Wang needle biopsies from 4L node  Plans:  The patient will be transferred to his inpatient room when recovered from anesthesia. We will review the cytology, pathology results with the patient when they become available.   Baltazar Apo, MD, PhD 11/19/2013, 11:23 AM [place] [phone] or if no answer [phone]

## 2013-11-19 NOTE — Interval H&P Note (Signed)
PCCM Interval Note  No significant changes since 3/12.  He has 2 negative AFB smears, one from sputum and one from bronchial washings.  Some increased dyspnea last night but overall stable. Continues to cough Filed Vitals:   11/18/13 0843 11/18/13 1300 11/18/13 2056 11/19/13 0516  BP: 123/81 120/85 125/81 110/68  Pulse: 87 98 86 70  Temp: 98.1 F (36.7 C) 98.6 F (37 C) 98.7 F (37.1 C) 98.5 F (36.9 C)  TempSrc: Oral Oral Oral Oral  Resp: 18 19 20 18   Height:      Weight:      SpO2: 95% 94% 97% 96%    Recent Labs Lab 11/17/13 0710 11/18/13 0457 11/19/13 0639  HGB 15.3 15.4 14.9  HCT 41.8 42.2 40.3  WBC 5.4 6.6 5.4  PLT 237 235 227    Recent Labs Lab 11/15/13 1853 11/16/13 0630 11/18/13 0457  NA 140 142 141  K 3.2* 3.9 3.4*  CL 103 105 104  CO2 23 22 22   GLUCOSE 124* 106* 91  BUN 12 11 15   CREATININE 0.72 0.69 0.82  CALCIUM 8.9 9.0 9.1    Recent Labs Lab 11/16/13 1230 11/19/13 0639  INR 1.09 1.02   Plan: FOB + EBUS and bx's today 3/13  Baltazar Apo, MD, PhD 11/19/2013, 9:33 AM [facility] [phone] or if no answer [phone]

## 2013-11-19 NOTE — H&P (View-Only) (Signed)
PULMONARY / CRITICAL CARE MEDICINE   Name: Chad George MRN: [id number] DOB: 11-07-1970    ADMISSION DATE:  11/15/2013 CONSULTATION DATE:  11/16/2013  REFERRING MD :  Posey Pronto PRIMARY SERVICE: TRH  CHIEF COMPLAINT:  Cough  BRIEF PATIENT DESCRIPTION: 43 y/o male was admitted on 3/9 to [facility] with several weeks of cough and green sputum production.  He was found to have a left upper lung mass with innumerable pulmonary nodules.  SIGNIFICANT EVENTS / STUDIES:  3/9 CT chest > mass like lesion left upper lobe extending into lingula with innumerable tiny bilateral pulmonary nodules and mediastinal lymphadenopathy; T9 lytic lesion noted 3/11 FOB per Dr. Nelda Marseille. Poor visualization due to patient movement.    LINES / TUBES:   CULTURES: 3/10 blood >> 3/10 sputum >> 3/10 sputum AFB >>none 3/11 fob path>> ANTIBIOTICS: Off Per ID  HISTORY OF PRESENT ILLNESS:  43 y/o male was admitted on 3/9 to [facility] with several weeks of cough and green sputum production.  He was found to have a left upper lung mass with innumerable pulmonary nodules. He says that he developed the cough with green sputum production in January and was treated for one week with ceftriaxone x1 and moxifloxacin for a week on January 23.  He says that he felt like the cough improved somewhat initially, but throughout February he noticed increasing cough, mucus production (green), and dyspnea.  He had ocassional mild sweats at night (just forehead) and rare chills but doesn't recall a fever or rigors.  His wife has been sick in the last few days.  He works in a Air traffic controller.  He has lost weight intentionally due to high cholesterol.  He has not noticed any testicular lesions or masses. SUBJECTIVE:  Awake and alert, sitting up eating.  VITAL SIGNS: Temp:  [97.4 F (36.3 C)-98.8 F (37.1 C)] 98.1 F (36.7 C) (03/12 0843) Pulse Rate:  [70-133] 87 (03/12 0843) Resp:  [10-23] 18 (03/12 0843) BP: (103-160)/(62-99) 123/81 mmHg (03/12  0843) SpO2:  [90 %-97 %] 95 % (03/12 0843) Weight:  [73.891 kg (162 lb 14.4 oz)] 73.891 kg (162 lb 14.4 oz) (03/11 2100) HEMODYNAMICS:   VENTILATOR SETTINGS:   INTAKE / OUTPUT: Intake/Output     03/11 0701 - 03/12 0700 03/12 0701 - 03/13 0700   P.O. 300    Total Intake(mL/kg) 300 (4.1)    Urine (mL/kg/hr)     Total Output       Net +300            PHYSICAL EXAMINATION:  Gen: well appearing, no acute distress, sitting in chair HEENT: No LAN/JVD PULM: decreased bs bases CV: RRR, no mgr, no JVD AB: BS+, soft, nontender, no hsm Ext: warm, no edema, no clubbing, no cyanosis Derm: no rash or skin breakdown Neuro: intact   LABS:  CBC  Recent Labs Lab 11/16/13 0630 11/17/13 0710 11/18/13 0457  WBC 5.1 5.4 6.6  HGB 14.7 15.3 15.4  HCT 40.0 41.8 42.2  PLT 233 237 235   Coag's  Recent Labs Lab 11/16/13 1230  INR 1.09   BMET  Recent Labs Lab 11/15/13 1853 11/16/13 0630 11/18/13 0457  NA 140 142 141  K 3.2* 3.9 3.4*  CL 103 105 104  CO2 23 22 22   BUN 12 11 15   CREATININE 0.72 0.69 0.82  GLUCOSE 124* 106* 91   Electrolytes  Recent Labs Lab 11/15/13 1853 11/16/13 0630 11/18/13 0457  CALCIUM 8.9 9.0 9.1   Sepsis Markers No results  found for this basename: LATICACIDVEN, PROCALCITON, O2SATVEN,  in the last 168 hours ABG No results found for this basename: PHART, PCO2ART, PO2ART,  in the last 168 hours Liver Enzymes  Recent Labs Lab 11/16/13 0630  AST 18  ALT 17  ALKPHOS 124*  BILITOT 0.6  ALBUMIN 3.7   Cardiac Enzymes No results found for this basename: TROPONINI, PROBNP,  in the last 168 hours Glucose No results found for this basename: GLUCAP,  in the last 168 hours  Imaging No results found.    CXR: left lung consolidation with innumerable pulm nodules  ASSESSMENT / PLAN:  PULMONARY A: Subacute cough with mucus production and a left upper lobe consolidation vs mass and innumerable pulmonary nodules.  I am most concerned about  possible malignancy (testicular vs primary lung) vs an atypical infection like a fungal infection vs less likely sarcoid.  Radiographically this does not look like TB to me, but I value our ID colleagues opinion here. P:   - bronchoscopy 3/11 with fluor and TB precautions - will perform TBBX, brushing and BAL - repeat cx  r 3/12 post fob follow up - testicular exam   INFECTIOUS A:  Post obstructive pneumonia vs atypical infection? See above P:   - agree with holding antibiotics for now - ID consult  Note: May need repeat FOB with intubation and sedation in ICU if current FOB has a low yield.  Richardson Landry Minor ACNP Maryanna Shape PCCM Pager [phone] till 3 pm If no answer page [phone] 11/18/2013, 9:22 AM  AFB pending.  Cytology pending.  Will schedule for EBUS with Dr. Lamonte Sakai in AM.  Patient seen and examined, agree with above note.  I dictated the care and orders written for this patient under my direction.  Rush Farmer, MD [phone]

## 2013-11-19 NOTE — Progress Notes (Signed)
Per Infection Prevention, request to place order for airborne isolation. Done, will continue to monitor

## 2013-11-19 NOTE — Progress Notes (Signed)
EBUS

## 2013-11-20 ENCOUNTER — Inpatient Hospital Stay (HOSPITAL_COMMUNITY): Payer: Medicaid Other

## 2013-11-20 ENCOUNTER — Encounter (HOSPITAL_COMMUNITY): Payer: Self-pay | Admitting: Radiology

## 2013-11-20 DIAGNOSIS — M949 Disorder of cartilage, unspecified: Secondary | ICD-10-CM

## 2013-11-20 DIAGNOSIS — M899 Disorder of bone, unspecified: Secondary | ICD-10-CM

## 2013-11-20 LAB — CULTURE, BAL-QUANTITATIVE W GRAM STAIN: Colony Count: >=100000

## 2013-11-20 LAB — CULTURE, BAL-QUANTITATIVE

## 2013-11-20 LAB — HISTOPLASMA ANTIGEN, URINE: Histoplasma Antigen, urine: <0.5 ng/mL

## 2013-11-20 MED ORDER — IOHEXOL 300 MG/ML  SOLN
100.0000 mL | Freq: Once | INTRAMUSCULAR | Status: AC | PRN
  Administered 2013-11-20: 100 mL via INTRAVENOUS

## 2013-11-20 MED ORDER — IOHEXOL 300 MG/ML  SOLN
25.0000 mL | INTRAMUSCULAR | Status: AC
  Administered 2013-11-20: 25 mL via ORAL

## 2013-11-20 NOTE — Consult Note (Signed)
Reason for Referral: Lung mass.   HPI: 43 year male native of [place] but has been in this area for many years. He is a generally a healthy mas but around 09/2013 he started develop symptoms of weakness fatigue tiredness and shortness of breath. He was admitted on 11/15/2013 to [facility] with several weeks of cough and green sputum production. CT scan done at that time and was found to have a left upper lung mass with innumerable pulmonary nodules.  He says that he developed the cough with green sputum production in January and was treated for one week with ceftriaxone x1 and moxifloxacin for a week on January 23. He says that he felt like the cough improved somewhat initially, but throughout February he noticed increasing cough, mucus production (green), and dyspnea. He had ocassional mild sweats at night (just forehead) and rare chills but doesn't recall a fever or rigors. His wife has been sick in the last few days. He works in a Air traffic controller. He has lost weight intentionally due to high cholesterol. He has not noticed any testicular lesions or masses. He underwent a repeat bronchoscopy and endobronchial ultrasound on 11/19/2013. Brushings were obtained as well as lymph node biopsy on multiple nodes as well as his left upper lobe mass. He tolerated the procedure well with some occasional hoarseness and some hemoptysis. 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    Overall, he has felt relatively well. He is able to ambulate without any difficulty. He is not using oxygen but does report some occasional dyspnea on exertion, chest pain and certainly cough. He denied any recent smoking history or any exposure to any certain chemicals.    Past Medical History  Diagnosis Date  . High cholesterol   . Pneumonia   :  Past Surgical History  Procedure Laterality Date  . Carpal tunner    . Tendon repair    . Video bronchoscopy Bilateral 11/17/2013    Procedure: VIDEO  BRONCHOSCOPY WITH FLUORO;  Surgeon: Rush Farmer, MD;  Location: [place];  Service: Cardiopulmonary;  Laterality: Bilateral;  :  Current Facility-Administered Medications  Medication Dose Route Frequency Provider Last Rate Last Dose  . 0.9 %  sodium chloride infusion   Intravenous Continuous Rush Farmer, MD 20 mL/hr at 11/17/13 1045    . albuterol (PROVENTIL) (2.5 MG/3ML) 0.083% nebulizer solution 2.5 mg  2.5 mg Nebulization Q2H PRN Kelvin Cellar, MD      . benzonatate (TESSALON) capsule 100 mg  100 mg Oral TID PRN Berle Mull, MD   100 mg at 11/19/13 2204  . guaiFENesin (MUCINEX) 12 hr tablet 600 mg  600 mg Oral BID Berle Mull, MD   600 mg at 11/20/13 1059  . HYDROmorphone (DILAUDID) injection 0.5 mg  0.5 mg Intravenous Q4H PRN Gardiner Barefoot, NP   0.5 mg at 11/17/13 2116  . iohexol (OMNIPAQUE) 300 MG/ML solution 25 mL  25 mL Oral Q1 Hr x 2 Medication Radiologist, MD   25 mL at 11/20/13 1324  . lactated ringers infusion   Intravenous Continuous Finis Bud, MD      . oxyCODONE (Oxy IR/ROXICODONE) immediate release tablet 5-10 mg  5-10 mg Oral Q6H PRN Gardiner Barefoot, NP   5 mg at 11/20/13 0753       Allergies  Allergen Reactions  . Amoxicillin-Pot Clavulanate   . Penicillins     REACTION: RASH  :  Family History  Problem Relation Age of Onset  .  Stroke Father   :  History   Social History  . Marital Status: Single    Spouse Name: N/A    Number of Children: N/A  . Years of Education: N/A   Occupational History  . Not on file.   Social History Main Topics  . Smoking status: Never Smoker   . Smokeless tobacco: Not on file  . Alcohol Use: No  . Drug Use: No  . Sexual Activity: Yes   Other Topics Concern  . Not on file   Social History Narrative  . No narrative on file  :  Constitutional: negative for chills, fatigue and malaise Eyes: negative for cataracts, icterus and irritation Ears, nose, mouth, throat, and face: negative for  earaches, epistaxis and hoarseness Respiratory: negative for dyspnea on exertion, hemoptysis and wheezing Cardiovascular: negative for orthopnea, palpitations and paroxysmal nocturnal dyspnea Gastrointestinal: negative for abdominal pain, constipation and diarrhea Genitourinary:negative for hematuria, hesitancy and nocturia Integument/breast: negative for dryness, pruritus and rash Hematologic/lymphatic: negative for bleeding, easy bruising and lymphadenopathy Musculoskeletal:negative for arthralgias and stiff joints Neurological: negative for dizziness, gait problems and paresthesia Behavioral/Psych: negative for behavior problems, depression and mood swings Endocrine: negative for temperature intolerance Allergic/Immunologic: negative for anaphylaxis and urticaria  Exam: ECOG 1 Blood pressure 129/75, pulse 88, temperature 98.5 F (36.9 C), temperature source Oral, resp. rate 18, height $RemoveBe'5\' 6"'[id number]$  (1.676 m), weight 162 lb 14.4 oz (73.891 kg), SpO2 98.00%. General appearance: alert, cooperative and appears stated age Head: Normocephalic, without obvious abnormality, atraumatic Throat: lips, mucosa, and tongue normal; teeth and gums normal Neck: no adenopathy, no carotid bruit, no JVD, supple, symmetrical, trachea midline and thyroid not enlarged, symmetric, no tenderness/mass/nodules Back: symmetric, no curvature. ROM normal. No CVA tenderness. Resp: diminished breath sounds RML Chest wall: no tenderness Cardio: regular rate and rhythm, S1, S2 normal, no murmur, click, rub or gallop GI: soft, non-tender; bowel sounds normal; no masses,  no organomegaly Extremities: extremities normal, atraumatic, no cyanosis or edema Pulses: 2+ and symmetric Skin: Skin color, texture, turgor normal. No rashes or lesions Lymph nodes: Cervical, supraclavicular, and axillary nodes normal. Neurologic: Grossly normal   Recent Labs  11/18/13 0457 11/19/13 0639  WBC 6.6 5.4  HGB 15.4 14.9  HCT 42.2 40.3   PLT 235 227    Recent Labs  11/18/13 0457  NA 141  K 3.4*  CL 104  CO2 22  GLUCOSE 91  BUN 15  CREATININE 0.82  CALCIUM 9.1      CT Chest W Contrast  11/15/2013   CLINICAL DATA:  Shortness of breath. Chest pain and back pain. Pulmonary infiltrate.  EXAM: CT CHEST WITH CONTRAST  TECHNIQUE: Multidetector CT imaging of the chest was performed during intravenous contrast administration.  CONTRAST:  41mL OMNIPAQUE IOHEXOL 300 MG/ML  SOLN  COMPARISON:  Chest x-rays dated 10/01/2013 and 11/15/2013  FINDINGS: The patient has innumerable tiny bilateral pulmonary nodules as well as an extensive poorly defined masslike lesion in in the left upper lobe extending into the lingula. There is extensive mediastinal adenopathy. Left periaortic node on image 20 of series 2 measures 18 mm. A lymph node anterior to the left hilum on image 25 of series 2 is also 18 mm. The masslike area in the left upper lobe measures 5.4 x 4.7 x 3.7 cm.  Heart size and pulmonary vascularity are normal. No effusions. The visualized portion of the upper abdomen is normal. There is an 8 mm ovoid lucent lesion in the left posterior aspect of T9  vertebra, indeterminate.  The patient has extensive peribronchial thickening extending into the left lower lobe. This could be due to lymphatic obstruction or inflammation.  IMPRESSION: 1. Innumerable pulmonary nodules with a mass like ill-defined density in the left upper lobe as well as extensive mediastinal adenopathy. This could represent primary lung cancer with innumerable pulmonary metastases. Alternatively, this could represent an atypical infection such as coccidioidomycosis. 2. 8 mm lytic lesion in T9 which could represent metastatic disease but is indeterminate.   Electronically Signed   By: Rozetta Nunnery M.D.   On: 11/15/2013 22:07    Assessment and Plan:   43 year old gentleman with a recent finding of a left upper lobe mass measuring 5.4 x 4.7 x 3.7 cm as well as bilateral  pulmonary nodules. He had presented with constitutional symptoms of fatigue, weight loss and chest pain. He also reported some cough but no hemoptysis. He underwent a bronchoscopy and endobronchial ultrasound and a biopsy. The results are currently pending but the initial indications suggest possible adenocarcinoma. The differential diagnosis was discussed today with the patient and his wife. I have explained to him the initial indication to suggest malignancy and if this is indeed the case we are either dealing with primary lung cancer likely an adenocarcinoma and a nonsmoker or a remote smoker. Other malignancies could arise from the testes and metastasize to the lung is another possibility. Other malignancies such as GI,GU as well as melanoma could also be considered.   Recommendation at this point is to proceed with a CT scan abdomen and pelvis to complete his staging. And except is also to await the final results of the pathology which will give me an indication and confirmation and whether this is a malignancy as well as what type of malignancy we are dealing with. If we are dealing with an adenocarcinoma of the lung he will certainly be critical to check for a specific mutation such as EGFR or ALK. Once these results are in we can counsel the patient better about treatment options as well as prognosis.  He appears well and recovering quite nicely. I feel that this could be completed as an outpatient basis and I will arrange for him in followup at the [facility] and I have given him my contact information today.  Please call with any questions.

## 2013-11-20 NOTE — Progress Notes (Signed)
[facility] PROGRESS NOTE  Chad George NFA:[id number] DOB: [birth year] DOA: 11/15/2013 PCP: Aurora Mask, NP  Assessment/Plan: 1. Pulmonary nodules. Patient undergoing bronchoscopy, as cytology report from bronchial washings were reviewed. It appears that malignant cells were found to be present and favor adenocarcinoma. I discussed case with medical oncology as well, recommending proceeding with CT scan of abdomen and pelvis for staging. Unclear if this is the primary, pathology pending at the time of this dictation. He remains stable from a cardiovascular standpoint, satting well on oxygen. Given clinical stability plan to discharge in the next 24 hours, as he will followup with oncology in the outpatient setting to complete workup and and possibly arrange for initiating treatment. I have discussed results with patient and his wife today.  Code Status: Full Code Family Communication: I spoke with family Disposition Plan: Continue supportive care   Consultants:  Pulmonary medicine  Infectious disease  Procedures:  Bronchoscopy performed on 11/17/2013       Endobronchial ultrasound on 11/19/2013.  Antibiotics:  None  HPI/Subjective: 43 year-old male with no chronic medical problems presents with newly 3 month history of coughing sweats and subjective fevers and chills. He states that this began around [place] Year's time with productive cough with green sputum. The patient was placed on a seven-day course of antibiotics at the end of January 2015 without significant improvement. His primary care doctor provided him "an inhaler" Claritin, and nasal spray. There was minimal improvement. He continued to cough to the point of having small amounts of blood streaked sputum. He presents with increasing back pain associated with his cough.  Patient states that he has been trying to lose weight, but otherwise is eating well. He denies any recent travels. He has lived in [place] since  moving from [place] 18 years ago. Denies any recent travels. Has never lived outside of [place] in [place]. He denies any recent sick contacts. There's been no history of TB exposure. The patient works as an Clinical biochemist. He denies any headache, nausea, vomiting, diarrhea, abdominal pain, dysuria, unusual rashes, synovitis. He has never had an HIV test. He has a remote history of smoking from which he only smoked for 2 years. There is a remote history of marijuana use but denies any other illegal drug use. CT chest revealed innumerable pulmonary nodules with masslike lesion in the lingula with extensive mediastinal lymphadenopathy. There is also an 8 mm ovoid lesion in the T9 area. The patient states that he has 7 pet rabbits and he has pet fish for which he cleans the fish tank. He denies any open wounds on his hands or arms. He denies any dental problems or dental pain. Patient undergoing bronchoscopy on 11/17/2013, procedure performed by Dr.Yacoub, found to have a patent airway, although inflamed. Brushings performed at the LUL anterior segment. On 11/19/2013 patient undergoing endobronchial ultrasound, procedure performed by Dr.Byrum. Patient tolerated procedure well there were no immediate complications. Cytology reports from bronchioloalveolar lavage revealing the presence of malignant cells and favoring adenocarcinoma. I discussed case with medical oncology who recommended ordering a CT scan of abdomen and pelvis for complete staging. Pathology pending at the time of this dictation. He wasn't set up to followup with medical oncology in the cancer center to complete workup and possibly initiate therapy.     Objective: Filed Vitals:   11/20/13 1235  BP: 129/75  Pulse: 88  Temp: 98.5 F (36.9 C)  Resp: 18    Intake/Output Summary (Last 24 hours) at 11/20/13 1817  Last data filed at 11/20/13 1457  Gross per 24 hour  Intake    240 ml  Output      0 ml  Net    240 ml   Filed Weights    11/16/13 0026 11/16/13 2050 11/17/13 2100  Weight: 73.8 kg (162 lb 11.2 oz) 74.435 kg (164 lb 1.6 oz) 73.891 kg (162 lb 14.4 oz)    Exam:  General: Pt is alert, follows commands appropriately, not in acute distress  HEENT: No icterus, No thrush, /AT  Cardiovascular: RRR, S1/S2, no rubs, no gallops  Respiratory: Crackles on the right. No wheezing.  Abdomen: Soft/+BS, non tender, non distended, no guarding  Extremities: No edema, No lymphangitis, No petechiae, No rashes, no synovitis   Data Reviewed: Basic Metabolic Panel:  Recent Labs Lab 11/15/13 1853 11/16/13 0630 11/18/13 0457  NA 140 142 141  K 3.2* 3.9 3.4*  CL 103 105 104  CO2 23 22 22   GLUCOSE 124* 106* 91  BUN 12 11 15   CREATININE 0.72 0.69 0.82  CALCIUM 8.9 9.0 9.1   Liver Function Tests:  Recent Labs Lab 11/16/13 0630  AST 18  ALT 17  ALKPHOS 124*  BILITOT 0.6  PROT 6.4  ALBUMIN 3.7   No results found for this basename: LIPASE, AMYLASE,  in the last 168 hours No results found for this basename: AMMONIA,  in the last 168 hours CBC:  Recent Labs Lab 11/15/13 2112 11/16/13 0630 11/17/13 0710 11/18/13 0457 11/19/13 0639  WBC 8.1 5.1 5.4 6.6 5.4  NEUTROABS  --  3.1 2.8  --   --   HGB 15.5 14.7 15.3 15.4 14.9  HCT 41.9 40.0 41.8 42.2 40.3  MCV 82.6 83.2 84.3 83.6 82.9  PLT 240 233 237 235 227   Cardiac Enzymes: No results found for this basename: CKTOTAL, CKMB, CKMBINDEX, TROPONINI,  in the last 168 hours BNP (last 3 results) No results found for this basename: PROBNP,  in the last 8760 hours CBG: No results found for this basename: GLUCAP,  in the last 168 hours  Recent Results (from the past 240 hour(s))  CULTURE, EXPECTORATED SPUTUM-ASSESSMENT     Status: None   Collection Time    11/16/13  5:03 AM      Result Value Ref Range Status   Specimen Description SPUTUM   Final   Special Requests NONE   Final   Sputum evaluation     Final   Value: MICROSCOPIC FINDINGS SUGGEST THAT THIS  SPECIMEN IS NOT REPRESENTATIVE OF LOWER RESPIRATORY SECRETIONS. PLEASE RECOLLECT.     RESULT CALLED TO, READ BACK BY AND VERIFIED WITHBarnabas Harries RN [phone] 0630 GREEN R   Report Status 11/16/2013 FINAL   Final  AFB CULTURE WITH SMEAR     Status: None   Collection Time    11/16/13  5:05 AM      Result Value Ref Range Status   Specimen Description SPUTUM   Final   Special Requests NONE   Final   ACID FAST SMEAR     Final   Value: NO ACID FAST BACILLI SEEN     Performed at [facility]   Culture     Final   Value: CULTURE WILL BE EXAMINED FOR 6 WEEKS BEFORE ISSUING A FINAL REPORT     Performed at [facility]   Report Status PENDING   Incomplete  CULTURE, BLOOD (ROUTINE X 2)     Status: None   Collection Time  11/16/13  5:30 AM      Result Value Ref Range Status   Specimen Description BLOOD LEFT UPPER ARM   Final   Special Requests     Final   Value: BOTTLES DRAWN AEROBIC AND ANAEROBIC 10CC BLUE,2CC RED   Culture  Setup Time     Final   Value: 11/16/2013 08:51     Performed at [facility]   Culture     Final   Value:        BLOOD CULTURE RECEIVED NO GROWTH TO DATE CULTURE WILL BE HELD FOR 5 DAYS BEFORE ISSUING A FINAL NEGATIVE REPORT     Performed at [facility]   Report Status PENDING   Incomplete  CULTURE, BLOOD (ROUTINE X 2)     Status: None   Collection Time    11/16/13  5:38 AM      Result Value Ref Range Status   Specimen Description BLOOD RIGHT UPPER ARM   Final   Special Requests BOTTLES DRAWN AEROBIC AND ANAEROBIC 10CC EACH   Final   Culture  Setup Time     Final   Value: 11/16/2013 08:51     Performed at [facility]   Culture     Final   Value:        BLOOD CULTURE RECEIVED NO GROWTH TO DATE CULTURE WILL BE HELD FOR 5 DAYS BEFORE ISSUING A FINAL NEGATIVE REPORT     Performed at [facility]   Report Status PENDING   Incomplete  FUNGUS CULTURE, BLOOD     Status: None   Collection Time    11/16/13  9:50 AM       Result Value Ref Range Status   Specimen Description BLOOD LEFT ANTECUBITAL   Final   Special Requests BOTTLES DRAWN AEROBIC AND ANAEROBIC 10CC   Final   Culture     Final   Value: NO FUNGUS ISOLATED;CULTURE IN PROGRESS FOR 7 DAYS     Performed at [facility]   Report Status PENDING   Incomplete  AFB CULTURE WITH SMEAR     Status: None   Collection Time    11/17/13 11:37 AM      Result Value Ref Range Status   Specimen Description BRONCHIAL WASHINGS   Final   Special Requests LUL   Final   ACID FAST SMEAR     Final   Value: NO ACID FAST BACILLI SEEN     Performed at [facility]   Culture     Final   Value: CULTURE WILL BE EXAMINED FOR 6 WEEKS BEFORE ISSUING A FINAL REPORT     Performed at [facility]   Report Status PENDING   Incomplete  CULTURE, BAL-QUANTITATIVE     Status: None   Collection Time    11/17/13 11:51 AM      Result Value Ref Range Status   Specimen Description BRONCHIAL WASHINGS   Final   Special Requests LUL   Final   Gram Stain     Final   Value: ABUNDANT WBC PRESENT,BOTH PMN AND MONONUCLEAR     NO SQUAMOUS EPITHELIAL CELLS SEEN     FEW GRAM POSITIVE COCCI IN PAIRS     RARE GRAM NEGATIVE RODS     Performed at [facility]     Final   Value: >=100,000 COLONIES/ML     Performed at [facility]     Final  Value: Non-Pathogenic Oropharyngeal-type Flora Isolated.     Performed at [facility]   Report Status 11/20/2013 FINAL   Final  FUNGUS CULTURE W SMEAR     Status: None   Collection Time    11/17/13 11:51 AM      Result Value Ref Range Status   Specimen Description BRONCHIAL WASHINGS   Final   Special Requests LUL   Final   Fungal Smear     Final   Value: NO YEAST OR FUNGAL ELEMENTS SEEN     Performed at [facility]   Culture     Final   Value: CULTURE IN PROGRESS FOR FOUR WEEKS     Performed at [facility]   Report Status PENDING   Incomplete  LEGIONELLA  CULTURE     Status: None   Collection Time    11/17/13 11:51 AM      Result Value Ref Range Status   Specimen Description BRONCHIAL WASHINGS   Final   Special Requests NONE   Final   Culture     Final   Value: NO LEGIONELLA ISOLATED, CULTURE IN PROGRESS FOR 5 DAYS     Performed at [facility]   Report Status PENDING   Incomplete     Studies: No results found.  Scheduled Meds: . guaiFENesin  600 mg Oral BID   Continuous Infusions: . sodium chloride 20 mL/hr at 11/17/13 1045  . lactated ringers      Principal Problem:   Left pulmonary infiltrate on CXR Active Problems:   ALLERGIC RHINITIS   Pulmonary nodules/lesions, multiple   Hyperglycemia   Dyslipidemia   Mediastinal lymphadenopathy    Time spent: 30  min    Chad George  [facility] Pager [phone]. If 7PM-7AM, please contact night-coverage at [email or web address], password [facility] 11/20/2013, 6:17 PM  LOS: 5 days

## 2013-11-21 ENCOUNTER — Encounter (HOSPITAL_COMMUNITY): Payer: Self-pay | Admitting: Internal Medicine

## 2013-11-21 DIAGNOSIS — C801 Malignant (primary) neoplasm, unspecified: Principal | ICD-10-CM

## 2013-11-21 HISTORY — DX: Malignant (primary) neoplasm, unspecified: C80.1

## 2013-11-21 MED ORDER — OXYCODONE HCL 5 MG PO TABS: 5.0000 mg | ORAL_TABLET | Freq: Four times a day (QID) | ORAL | Status: DC | PRN

## 2013-11-21 MED ORDER — HYDROCOD POLST-CHLORPHEN POLST 10-8 MG/5ML PO LQCR
5.0000 mL | Freq: Two times a day (BID) | ORAL | Status: AC | PRN
Start: 2013-11-21 — End: ?

## 2013-11-21 MED ORDER — BENZONATATE 100 MG PO CAPS: 100.0000 mg | ORAL_CAPSULE | Freq: Three times a day (TID) | ORAL | Status: DC | PRN

## 2013-11-21 NOTE — Discharge Summary (Signed)
Physician Discharge Summary  Chad George CBJ:[id number] DOB: 09-15-1970 DOA: 11/15/2013  PCP: Chad Mask, NP  Admit date: 11/15/2013 Discharge date: 11/21/2013  Time spent: 35 minutes  Recommendations for Outpatient Follow-up:  1. Cytology from BAL showing presence of malignant cells, please follow up on pathology report.   Discharge Diagnoses:  Principal Problem:   Pulmonary nodules/lesions, multiple Active Problems:   Adenocarcinoma   Mediastinal lymphadenopathy   ALLERGIC RHINITIS   Left pulmonary infiltrate on CXR   Hyperglycemia   Dyslipidemia   Discharge Condition: Stable  Diet recommendation: Regular  Filed Weights   11/16/13 2050 11/17/13 2100 11/20/13 2118  Weight: 74.435 kg (164 lb 1.6 oz) 73.891 kg (162 lb 14.4 oz) 73.256 kg (161 lb 8 oz)    History of present illness:  Chad George is a 43 y.o. male with Past medical history of dyslipidemia.  The patient is coming from home.  The patient presented with complaints of cough with greenish expectoration that is present since January 2015. He mentions earlier in January he presented with similar complaint and was treated with antibiotics for 7 days. He also completed her on another course of antibiotic for 5 days in February. He continues to have cough without any improvement in his symptoms along with complaints of right-sided back pain which was worsening in nature but initially it was worsening with cough and breathing. He denies any fever but complains of chills with diaphoresis. He complains of night sweats. He denies headache lumps or bumps anywhere and also mentions that he has intentionally tried to lose weight due to his increased cholesterol.  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  No other exposure, no history of active smoking, no alcohol abuse no drug abuse, no recent  travel.  He is from [place] but has not travel to [place] for more than 18 years.  He denies any similar symptoms her prior pneumonias.  He denies any history of testicular complains or congenital abnormalities of the testis.   Hospital Course:  43 year-old male with no chronic medical problems presents with newly 3 month history of coughing sweats and subjective fevers and chills. He states that this began around [place] Year's time with productive cough with green sputum. The patient was placed on a seven-day course of antibiotics at the end of January 2015 without significant improvement. His primary care doctor provided him "an inhaler" Claritin, and nasal spray. There was minimal improvement. He continued to cough to the point of having small amounts of blood streaked sputum. He presents with increasing back pain associated with his cough.  Patient states that he has been trying to lose weight, but otherwise is eating well. He denies any recent travels. He has lived in [place] since moving from [place] 18 years ago. Denies any recent travels. Has never lived outside of [place] in [place]. He denies any recent sick contacts. There's been no history of TB exposure. The patient works as an Clinical biochemist. He denies any headache, nausea, vomiting, diarrhea, abdominal pain, dysuria, unusual rashes, synovitis. He has never had an HIV test. He has a remote history of smoking from which he only smoked for 2 years. There is a remote history of marijuana use but denies any other illegal drug use. CT chest revealed innumerable pulmonary nodules with masslike lesion in the lingula with extensive mediastinal  lymphadenopathy. There is also an 8 mm ovoid lesion in the T9 area. The patient states that he has 7 pet rabbits and he has pet fish for which he cleans the fish tank. He denies any open wounds on his hands or arms. He denies any dental problems or dental pain. Patient undergoing bronchoscopy on  11/17/2013, procedure performed by Dr.Yacoub, found to have a patent airway, although inflamed. Brushings performed at the LUL anterior segment. On 11/19/2013 patient undergoing endobronchial ultrasound, procedure performed by Dr.Byrum. Patient tolerated procedure well there were no immediate complications. Cytology reports from bronchioloalveolar lavage revealing the presence of malignant cells and favoring adenocarcinoma. I discussed case with medical oncology who recommended ordering a CT scan of abdomen and pelvis for complete staging. Pathology pending at the time of this dictation. He was set up to followup with medical oncology in the cancer center to complete workup and possibly initiate therapy.   Procedures:  Bronchoscopy performed on 11/17/2013  Endobronchial ultrasound performed on 11/19/2048  Consultations:  Pulmonary critical care medicine  Infectious disease  Medical oncology  Discharge Exam: Filed Vitals:   11/21/13 0529  BP: 106/72  Pulse: 77  Temp: 98.5 F (36.9 C)  Resp: 18   General: Pt is alert, follows commands appropriately, not in acute distress  HEENT: No icterus, No thrush, [place]/AT  Cardiovascular: RRR, S1/S2, no rubs, no gallops  Respiratory: Crackles on the right. No wheezing.  Abdomen: Soft/+BS, non tender, non distended, no guarding  Extremities: No edema, No lymphangitis, No petechiae, No rashes, no synovitis    Discharge Instructions      Discharge Orders   Future Appointments Provider Department Dept Phone   02/14/2014 4:45 PM [name], MD [place] [phone]   Future Orders Complete By Expires   Call MD for:  difficulty breathing, headache or visual disturbances  As directed    Call MD for:  extreme fatigue  As directed    Call MD for:  persistant nausea and vomiting  As directed    Call MD for:  severe uncontrolled pain  As directed    Call MD for:  temperature >100.4  As directed    Diet - low  sodium heart healthy  As directed    Increase activity slowly  As directed        Medication List    STOP taking these medications       guaiFENesin-codeine 100-10 MG/5ML syrup      TAKE these medications       acetaminophen 325 MG tablet  Commonly known as:  TYLENOL  Take 650 mg by mouth every 6 (six) hours as needed.     albuterol 108 (90 BASE) MCG/ACT inhaler  Commonly known as:  PROVENTIL HFA;VENTOLIN HFA  Inhale 2 puffs into the lungs every 6 (six) hours as needed for wheezing or shortness of breath.     benzonatate 100 MG capsule  Commonly known as:  TESSALON  Take 1 capsule (100 mg total) by mouth 3 (three) times daily as needed for cough.     chlorpheniramine-HYDROcodone 10-8 MG/5ML Lqcr  Commonly known as:  TUSSIONEX PENNKINETIC ER  Take 5 mLs by mouth every 12 (twelve) hours as needed for cough.     fluticasone 50 MCG/ACT nasal spray  Commonly known as:  FLONASE  Place 2 sprays into both nostrils daily.     loratadine 10 MG tablet  Commonly known as:  CLARITIN  Take 1 tablet (10 mg total) by  mouth daily.     oxyCODONE 5 MG immediate release tablet  Commonly known as:  Oxy IR/ROXICODONE  Take 1-2 tablets (5-10 mg total) by mouth every 6 (six) hours as needed for severe pain.       Allergies  Allergen Reactions  . Amoxicillin-Pot Clavulanate   . Penicillins     REACTION: RASH   Follow-up Information   Follow up with [facility], MD In 1 week.   Specialty:  Oncology   Contact information:   [address] [place] [address] [phone]        The results of significant diagnostics from this hospitalization (including imaging, microbiology, ancillary and laboratory) are listed below for reference.    Significant Diagnostic Studies: Dg Chest 2 View  11/18/2013   CLINICAL DATA:  Post fiberoptic bronchoscopy  EXAM: CHEST  2 VIEW  COMPARISON:  CT chest of 11/15/2013 and chest x-ray of the same date  FINDINGS: Multiple pulmonary nodules are  scattered diffusely throughout both lungs. There is little change in more opacity within the anterior left upper lobe, worrisome for neoplasm. No pleural effusion is seen. Heart size is stable. No bony abnormality is noted.  IMPRESSION: 1. No change in multiple pulmonary nodules diffusely. 2. No change in left upper lobe opacity with prominent paratracheal soft tissue worrisome for mass and or adenopathy as with lung neoplasm.   Electronically Signed   By: Ivar Drape M.D.   On: 11/18/2013 10:48   Dg Chest 2 View  11/15/2013   CLINICAL DATA:  Chest pain with cough. Recent diagnosis of pneumonia.  EXAM: CHEST  2 VIEW  COMPARISON:  10/01/2013  FINDINGS: Consolidation persists, predominantly in the left upper lobe lateral to the left hilum with some infiltrate also evident in the left lower lobe.  There are numerous bilateral small ill-defined pulmonary nodules. These are more prominent than on the prior study. No other areas of lung consolidation. No pleural effusion or pneumothorax.  Cardiac silhouette is normal in size. Left hilum is partly silhouetted with the left upper lobe consolidation normal mediastinal and right hilar contours.  Bony thorax is intact.  IMPRESSION: 1. Persistent consolidation extending laterally from the left hilum predominantly in the left upper lobe with some left lower lobe consolidation. 2. Multiple small pulmonary nodules which a more prominent than they did on the prior exam. 3. The persistence of the left sided consolidation, along with the mild increase in multiple ill-defined small pulmonary nodules suggests an atypical infection. Consider a fungal etiology. A followup contrast enhanced CT may be helpful.   Electronically Signed   By: Lajean Manes M.D.   On: 11/15/2013 18:04   Ct Chest W Contrast  11/15/2013   CLINICAL DATA:  Shortness of breath. Chest pain and back pain. Pulmonary infiltrate.  EXAM: CT CHEST WITH CONTRAST  TECHNIQUE: Multidetector CT imaging of the chest was  performed during intravenous contrast administration.  CONTRAST:  90mL OMNIPAQUE IOHEXOL 300 MG/ML  SOLN  COMPARISON:  Chest x-rays dated 10/01/2013 and 11/15/2013  FINDINGS: The patient has innumerable tiny bilateral pulmonary nodules as well as an extensive poorly defined masslike lesion in in the left upper lobe extending into the lingula. There is extensive mediastinal adenopathy. Left periaortic node on image 20 of series 2 measures 18 mm. A lymph node anterior to the left hilum on image 25 of series 2 is also 18 mm. The masslike area in the left upper lobe measures 5.4 x 4.7 x 3.7 cm.  Heart size  and pulmonary vascularity are normal. No effusions. The visualized portion of the upper abdomen is normal. There is an 8 mm ovoid lucent lesion in the left posterior aspect of T9 vertebra, indeterminate.  The patient has extensive peribronchial thickening extending into the left lower lobe. This could be due to lymphatic obstruction or inflammation.  IMPRESSION: 1. Innumerable pulmonary nodules with a mass like ill-defined density in the left upper lobe as well as extensive mediastinal adenopathy. This could represent primary lung cancer with innumerable pulmonary metastases. Alternatively, this could represent an atypical infection such as coccidioidomycosis. 2. 8 mm lytic lesion in T9 which could represent metastatic disease but is indeterminate.   Electronically Signed   By: Rozetta Nunnery M.D.   On: 11/15/2013 22:07   Ct Abdomen Pelvis W Contrast  11/20/2013   CLINICAL DATA:  Mid upper abdominal pain  EXAM: CT ABDOMEN AND PELVIS WITH CONTRAST  TECHNIQUE: Multidetector CT imaging of the abdomen and pelvis was performed using the standard protocol following bolus administration of intravenous contrast.  CONTRAST:  133mL OMNIPAQUE IOHEXOL 300 MG/ML  SOLN  COMPARISON:  Report of remote non digitized exam 03/10/2001, CT CHEST W/CM dated 11/15/2013  FINDINGS: There are innumerable nodules throughout the visualized  portions of the lung bases, representative nodule measuring 0.8 cm left lower lobe image 3. No pleural effusion.  Too small to characterize 7 mm hypodense lesion medial segment left hepatic lobe image 16. Nonobstructing 3 mm left mid renal calculus image 34. Gallbladder, adrenal glands, right kidney, and pancreas are normal. 2.4 cm irregular hypodense splenic mass image 30. No ascites or free air.  The appendix is normal. No bowel wall thickening or focal segmental dilatation. No abdominal or retroperitoneal lymphadenopathy.  There are multiple lytic osseous lesions including L2 and L4 vertebral bodies, previously reported lower thoracic vertebral bodies, left iliac bone, left acetabulum, and right acetabulum, for example. No pathologic fracture or other acute abnormality is identified.  IMPRESSION: Innumerable pulmonary parenchymal nodules are reidentified as previously described on dedicated exam of this anatomic area 11/15/2013.  Irregular splenic mass and too small to characterize hypodense hepatic mass. This could represent metastatic disease given the findings in the chest. Infection would be less likely to manifest within these intra-abdominal organs.  Lytic osseous lesions which could also be metastatic but which have been reported in actinomycosis as described in the chest CT report.  PET-CT is recommended as an outpatient for further evaluation.   Electronically Signed   By: Conchita Paris M.D.   On: 11/20/2013 18:16    Microbiology: Recent Results (from the past 240 hour(s))  CULTURE, EXPECTORATED SPUTUM-ASSESSMENT     Status: None   Collection Time    11/16/13  5:03 AM      Result Value Ref Range Status   Specimen Description SPUTUM   Final   Special Requests NONE   Final   Sputum evaluation     Final   Value: MICROSCOPIC FINDINGS SUGGEST THAT THIS SPECIMEN IS NOT REPRESENTATIVE OF LOWER RESPIRATORY SECRETIONS. PLEASE RECOLLECT.     RESULT CALLED TO, READ BACK BY AND VERIFIED WITHBarnabas Harries RN [phone] 0630 GREEN R   Report Status 11/16/2013 FINAL   Final  AFB CULTURE WITH SMEAR     Status: None   Collection Time    11/16/13  5:05 AM      Result Value Ref Range Status   Specimen Description SPUTUM   Final   Special Requests NONE   Final  ACID FAST SMEAR     Final   Value: NO ACID FAST BACILLI SEEN     Performed at [facility]   Culture     Final   Value: CULTURE WILL BE EXAMINED FOR 6 WEEKS BEFORE ISSUING A FINAL REPORT     Performed at [facility]   Report Status PENDING   Incomplete  CULTURE, BLOOD (ROUTINE X 2)     Status: None   Collection Time    11/16/13  5:30 AM      Result Value Ref Range Status   Specimen Description BLOOD LEFT UPPER ARM   Final   Special Requests     Final   Value: BOTTLES DRAWN AEROBIC AND ANAEROBIC 10CC BLUE,2CC RED   Culture  Setup Time     Final   Value: 11/16/2013 08:51     Performed at [facility]   Culture     Final   Value:        BLOOD CULTURE RECEIVED NO GROWTH TO DATE CULTURE WILL BE HELD FOR 5 DAYS BEFORE ISSUING A FINAL NEGATIVE REPORT     Performed at [facility]   Report Status PENDING   Incomplete  CULTURE, BLOOD (ROUTINE X 2)     Status: None   Collection Time    11/16/13  5:38 AM      Result Value Ref Range Status   Specimen Description BLOOD RIGHT UPPER ARM   Final   Special Requests BOTTLES DRAWN AEROBIC AND ANAEROBIC 10CC EACH   Final   Culture  Setup Time     Final   Value: 11/16/2013 08:51     Performed at [facility]   Culture     Final   Value:        BLOOD CULTURE RECEIVED NO GROWTH TO DATE CULTURE WILL BE HELD FOR 5 DAYS BEFORE ISSUING A FINAL NEGATIVE REPORT     Performed at [facility]   Report Status PENDING   Incomplete  FUNGUS CULTURE, BLOOD     Status: None   Collection Time    11/16/13  9:50 AM      Result Value Ref Range Status   Specimen Description BLOOD LEFT ANTECUBITAL   Final   Special Requests BOTTLES DRAWN AEROBIC AND ANAEROBIC  10CC   Final   Culture     Final   Value: NO FUNGUS ISOLATED;CULTURE IN PROGRESS FOR 7 DAYS     Performed at [facility]   Report Status PENDING   Incomplete  AFB CULTURE WITH SMEAR     Status: None   Collection Time    11/17/13 11:37 AM      Result Value Ref Range Status   Specimen Description BRONCHIAL WASHINGS   Final   Special Requests LUL   Final   ACID FAST SMEAR     Final   Value: NO ACID FAST BACILLI SEEN     Performed at [facility]   Culture     Final   Value: CULTURE WILL BE EXAMINED FOR 6 WEEKS BEFORE ISSUING A FINAL REPORT     Performed at [facility]   Report Status PENDING   Incomplete  CULTURE, BAL-QUANTITATIVE     Status: None   Collection Time    11/17/13 11:51 AM      Result Value Ref Range Status   Specimen Description BRONCHIAL WASHINGS   Final   Special Requests LUL   Final  Gram Stain     Final   Value: ABUNDANT WBC PRESENT,BOTH PMN AND MONONUCLEAR     NO SQUAMOUS EPITHELIAL CELLS SEEN     FEW GRAM POSITIVE COCCI IN PAIRS     RARE GRAM NEGATIVE RODS     Performed at [facility]     Final   Value: >=100,000 COLONIES/ML     Performed at [facility]   Culture     Final   Value: Non-Pathogenic Oropharyngeal-type Flora Isolated.     Performed at [facility]   Report Status 11/20/2013 FINAL   Final  FUNGUS CULTURE W SMEAR     Status: None   Collection Time    11/17/13 11:51 AM      Result Value Ref Range Status   Specimen Description BRONCHIAL WASHINGS   Final   Special Requests LUL   Final   Fungal Smear     Final   Value: NO YEAST OR FUNGAL ELEMENTS SEEN     Performed at [facility]   Culture     Final   Value: CULTURE IN PROGRESS FOR FOUR WEEKS     Performed at [facility]   Report Status PENDING   Incomplete  LEGIONELLA CULTURE     Status: None   Collection Time    11/17/13 11:51 AM      Result Value Ref Range Status   Specimen Description BRONCHIAL  WASHINGS   Final   Special Requests NONE   Final   Culture     Final   Value: NO LEGIONELLA ISOLATED, CULTURE IN PROGRESS FOR 5 DAYS     Performed at [facility]   Report Status PENDING   Incomplete     Labs: Basic Metabolic Panel:  Recent Labs Lab 11/15/13 1853 11/16/13 0630 11/18/13 0457  NA 140 142 141  K 3.2* 3.9 3.4*  CL 103 105 104  CO2 23 22 22   GLUCOSE 124* 106* 91  BUN 12 11 15   CREATININE 0.72 0.69 0.82  CALCIUM 8.9 9.0 9.1   Liver Function Tests:  Recent Labs Lab 11/16/13 0630  AST 18  ALT 17  ALKPHOS 124*  BILITOT 0.6  PROT 6.4  ALBUMIN 3.7   No results found for this basename: LIPASE, AMYLASE,  in the last 168 hours No results found for this basename: AMMONIA,  in the last 168 hours CBC:  Recent Labs Lab 11/15/13 2112 11/16/13 0630 11/17/13 0710 11/18/13 0457 11/19/13 0639  WBC 8.1 5.1 5.4 6.6 5.4  NEUTROABS  --  3.1 2.8  --   --   HGB 15.5 14.7 15.3 15.4 14.9  HCT 41.9 40.0 41.8 42.2 40.3  MCV 82.6 83.2 84.3 83.6 82.9  PLT 240 233 237 235 227   Cardiac Enzymes: No results found for this basename: CKTOTAL, CKMB, CKMBINDEX, TROPONINI,  in the last 168 hours BNP: BNP (last 3 results) No results found for this basename: PROBNP,  in the last 8760 hours CBG: No results found for this basename: GLUCAP,  in the last 168 hours     Signed:  Kelvin Cellar  [facility] 11/21/2013, 10:29 AM

## 2013-11-21 NOTE — Progress Notes (Signed)
Pt discharged to home accomp by wife and friends.  Rx given and explained.  Copy of discharge instructions given and explained.  Pt is to follow up with Dr Osker Mason and has his card, knows to call for appt.  Pt given yellow card for flu and PNA vaccines which he received on 11/17/13.

## 2013-11-22 ENCOUNTER — Telehealth: Payer: Self-pay | Admitting: Oncology

## 2013-11-22 LAB — CULTURE, BLOOD (ROUTINE X 2)
CULTURE: NO GROWTH
Culture: NO GROWTH

## 2013-11-22 LAB — FUNGAL ANTIBODIES PANEL, ID-BLOOD
ASPERGILLUS FLAVUS ANTIBODIES: NEGATIVE
ASPERGILLUS NIGER ANTIBODIES: NEGATIVE
Aspergillus fumigatus: NEGATIVE
Blastomyces Abs, Qn, DID: NEGATIVE
Coccidioides Antibody ID: NEGATIVE
Histoplasma Ab, Immunodiffusion: NEGATIVE

## 2013-11-22 NOTE — Progress Notes (Signed)
Patient seen and examined, agree with above note.  I dictated the care and orders written for this patient under my direction.  Wesam G Yacoub, MD [phone] 

## 2013-11-22 NOTE — Telephone Encounter (Signed)
S/W PATIENT AND GAVE NEW PATIENT APPT FOR 03/20 @ 1:30 W/DR. SHADAD. D/T PER MD

## 2013-11-22 NOTE — Telephone Encounter (Signed)
C/D 11/22/13 for appt. 11/26/13

## 2013-11-23 ENCOUNTER — Encounter (HOSPITAL_COMMUNITY): Payer: Self-pay | Admitting: Emergency Medicine

## 2013-11-23 LAB — LEGIONELLA CULTURE

## 2013-11-24 LAB — FUNGUS CULTURE, BLOOD: Culture: NO GROWTH

## 2013-11-25 ENCOUNTER — Telehealth: Payer: Self-pay | Admitting: Medical Oncology

## 2013-11-25 NOTE — Telephone Encounter (Signed)
Confirmed tomorrows appt with pt, requested patient to bring current list of medications as well as informed patient about our free [place] parking. Denies questions at this time.

## 2013-11-26 ENCOUNTER — Encounter: Payer: Self-pay | Admitting: Oncology

## 2013-11-26 ENCOUNTER — Other Ambulatory Visit: Payer: No Typology Code available for payment source

## 2013-11-26 ENCOUNTER — Other Ambulatory Visit (HOSPITAL_COMMUNITY)
Admission: RE | Admit: 2013-11-26 | Discharge: 2013-11-26 | Disposition: A | Discharge status: Home / Self Care | Payer: Medicaid Other | Source: Ambulatory Visit | Attending: Oncology | Admitting: Oncology

## 2013-11-26 ENCOUNTER — Telehealth: Payer: Self-pay | Admitting: Oncology

## 2013-11-26 ENCOUNTER — Ambulatory Visit: Payer: No Typology Code available for payment source

## 2013-11-26 ENCOUNTER — Ambulatory Visit (HOSPITAL_BASED_OUTPATIENT_CLINIC_OR_DEPARTMENT_OTHER): Payer: No Typology Code available for payment source | Admitting: Oncology

## 2013-11-26 VITALS — BP 118/67 | HR 82 | Temp 98.3°F | Resp 19 | Ht 66.0 in | Wt 165.3 lb

## 2013-11-26 DIAGNOSIS — C801 Malignant (primary) neoplasm, unspecified: Secondary | ICD-10-CM | POA: Insufficient documentation

## 2013-11-26 DIAGNOSIS — C3490 Malignant neoplasm of unspecified part of unspecified bronchus or lung: Secondary | ICD-10-CM

## 2013-11-26 DIAGNOSIS — C3410 Malignant neoplasm of upper lobe, unspecified bronchus or lung: Secondary | ICD-10-CM

## 2013-11-26 DIAGNOSIS — C7800 Secondary malignant neoplasm of unspecified lung: Secondary | ICD-10-CM

## 2013-11-26 NOTE — Progress Notes (Signed)
Checked in new pt with no financial concerns. °

## 2013-11-26 NOTE — Progress Notes (Signed)
Hematology and Oncology Follow Up Visit  Chad George [id number] Oct 18, 1970 43 y.o. 11/26/2013 2:10 PM Chad George,[name], [name], Chad Dowdy, NP   Principle Diagnosis: 43 year old gentleman with stage IV adenocarcinoma of the lung diagnosed in March of 2015. He presented with left upper lobe lung mass measuring 5.4 x 4.7 x 3.7 cm and bilateral pulmonary nodules. Pathology indicating adenocarcinoma of a lung primary.    Current therapy: Under evaluation for systemic therapy.  Interim History:  Chad George presents today for a followup visit after my initial consultation in the hospital on 11/15/2013. Since his discharge, he has been doing better. He still have some occasional cough that is not productive. He does report some occasional chest pain but has not taken any pain medication in the last few days. Has not reported any fevers or chills or sweats. His appetite is improving slowly. He does report some occasional exertional dyspnea but for the most part regained most activities of daily living without any hindrance or decline. Has not reported any back pain or abdominal pain. Has not reported any major changes in his bowel habits.  Medications: I have reviewed the patient's current medications.  Current Outpatient Prescriptions  Medication Sig Dispense Refill  . oxyCODONE (OXY IR/ROXICODONE) 5 MG immediate release tablet Take 1-2 tablets (5-10 mg total) by mouth every 6 (six) hours as needed for severe pain.  30 tablet  0  . acetaminophen (TYLENOL) 325 MG tablet Take 650 mg by mouth every 6 (six) hours as needed.      Marland Kitchen albuterol (PROVENTIL HFA;VENTOLIN HFA) 108 (90 BASE) MCG/ACT inhaler Inhale 2 puffs into the lungs every 6 (six) hours as needed for wheezing or shortness of breath.  1 Inhaler  0  . benzonatate (TESSALON) 100 MG capsule Take 1 capsule (100 mg total) by mouth 3 (three) times daily as needed for cough.  30 capsule  0  . chlorpheniramine-HYDROcodone (TUSSIONEX PENNKINETIC ER) 10-8  MG/5ML LQCR Take 5 mLs by mouth every 12 (twelve) hours as needed for cough.  1 Bottle  0  . fluticasone (FLONASE) 50 MCG/ACT nasal spray Place 2 sprays into both nostrils daily.  16 g  6  . loratadine (CLARITIN) 10 MG tablet Take 1 tablet (10 mg total) by mouth daily.  30 tablet  11   No current facility-administered medications for this visit.     Allergies:  Allergies  Allergen Reactions  . Amoxicillin-Pot Clavulanate   . Penicillins     REACTION: RASH    Past Medical History, Surgical history, Social history, and Family History were reviewed and updated.  Review of Systems: Constitutional:  Negative for fever, chills, night sweats, anorexia, weight loss, pain. Cardiovascular: no chest pain or dyspnea on exertion Respiratory: no cough, shortness of breath, or wheezing Neurological: no TIA or stroke symptoms Dermatological: negative for lumps, pruritus and rash ENT: negative Skin: Negative. Gastrointestinal: no abdominal pain, change in bowel habits, or black or bloody stools Genito-Urinary: no dysuria, trouble voiding, or hematuria Hematological and Lymphatic: negative for - fatigue, pallor or weight loss Breast: negative Musculoskeletal: negative for - joint pain or muscular weakness Remaining ROS negative. Physical Exam: Blood pressure 118/67, pulse 82, temperature 98.3 F (36.8 C), temperature source Oral, resp. rate 19, height _0  (1.676 m), weight 165 lb 4.8 oz (74.98 kg). ECOG:  General appearance: alert and cooperative Head: Normocephalic, without obvious abnormality, atraumatic Neck: no adenopathy, no carotid bruit, no JVD, supple, symmetrical, trachea midline and thyroid not enlarged, symmetric, no tenderness/mass/nodules Lymph nodes:  Cervical, supraclavicular, and axillary nodes normal. Heart:regular rate and rhythm, S1, S2 normal, no murmur, click, rub or gallop Lung:bilateral basilar rales heard Abdomin: soft, non-tender, without masses or  organomegaly EXT:no erythema, induration, or nodules   Lab Results: Lab Results  Component Value Date   WBC 5.4 11/19/2013   HGB 14.9 11/19/2013   HCT 40.3 11/19/2013   MCV 82.9 11/19/2013   PLT 227 11/19/2013     Chemistry      Component Value Date/Time   NA 141 11/18/2013 0457   K 3.4* 11/18/2013 0457   CL 104 11/18/2013 0457   CO2 22 11/18/2013 0457   BUN 15 11/18/2013 0457   CREATININE 0.82 11/18/2013 0457   CREATININE 0.69 08/16/2013 1727      Component Value Date/Time   CALCIUM 9.1 11/18/2013 0457   ALKPHOS 124* 11/16/2013 0630   AST 18 11/16/2013 0630   ALT 17 11/16/2013 0630   BILITOT 0.6 11/16/2013 0630      EXAM:  CT ABDOMEN AND PELVIS WITH CONTRAST  TECHNIQUE:  Multidetector CT imaging of the abdomen and pelvis was performed  using the standard protocol following bolus administration of  intravenous contrast.  CONTRAST: 119m OMNIPAQUE IOHEXOL 300 MG/ML SOLN  COMPARISON: Report of remote non digitized exam 03/10/2001, CT  CHEST W/CM dated 11/15/2013  FINDINGS:  There are innumerable nodules throughout the visualized portions of  the lung bases, representative nodule measuring 0.8 cm left lower  lobe image 3. No pleural effusion.  Too small to characterize 7 mm hypodense lesion medial segment left  hepatic lobe image 16. Nonobstructing 3 mm left mid renal calculus  image 34. Gallbladder, adrenal glands, right kidney, and pancreas  are normal. 2.4 cm irregular hypodense splenic mass image 30. No  ascites or free air.  The appendix is normal. No bowel wall thickening or focal segmental  dilatation. No abdominal or retroperitoneal lymphadenopathy.  There are multiple lytic osseous lesions including L2 and L4  vertebral bodies, previously reported lower thoracic vertebral  bodies, left iliac bone, left acetabulum, and right acetabulum, for  example. No pathologic fracture or other acute abnormality is  identified.  IMPRESSION:  Innumerable pulmonary parenchymal nodules  are reidentified as  previously described on dedicated exam of this anatomic area  11/15/2013.  Irregular splenic mass and too small to characterize hypodense  hepatic mass. This could represent metastatic disease given the  findings in the chest. Infection would be less likely to manifest  within these intra-abdominal organs.  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    Impression and Plan:   43year old gentleman with the following issues:  1. Stage IV adenocarcinoma of the lung with a left lung mass and bilateral pulmonary nodules. There is questionable sclerotic bone lesion as well as questionable hepatic lesion. The natural course of this disease was discussed today in detail with the patient and his wife. I've explained to him that that this is an incurable malignancy but certainly can be treatable depending on the type of adenocarcinoma we are dealing with. If he has an EGFR or ALK mutation, a targeted tyrosine kinase inhibitor might prove to be very beneficial and can give him palliation of symptoms and control of his disease a lot better than systemic chemotherapy. The studies are currently pending and once we have these results will prescribe the appropriate treatment. I discussed with them the roll of Tarceva potentially and  written information was given to the patient. Once we have the results of the pathology other specific mutations we will communicate these findings to him and start him on the appropriate treatment. If his mutations are completely negative then we'll have a discussion about systemic chemotherapy as an alternative option.  2. Cough and shortness of breath: This is related to #1 seems to be improving at this time.   3. Chest pain: Is improved at this time and does not require any pain medication.   4. followup: Will be the next few weeks after the results of his  pathology and start of his medication.    QUHKIS,NGXEX, MD 3/20/20152:10 PM

## 2013-11-26 NOTE — Telephone Encounter (Signed)
per pof pt sch appt 4/29 labs & Dr/printed sch &avs

## 2013-12-02 ENCOUNTER — Telehealth: Payer: Self-pay | Admitting: *Deleted

## 2013-12-02 NOTE — Telephone Encounter (Signed)
Patient calling to say he has missed dwork this week, d/t feeling so tired and run down, coughing, h/a's, chest and back discomfort. Per dr Alen Blew note excusing patient from work, 3/23,3/26, and 3/27, d/t stage IV lung cancer. left at front for patient p/u.  copy sent to be scanned into chart.

## 2013-12-03 ENCOUNTER — Encounter (HOSPITAL_COMMUNITY): Payer: Self-pay

## 2013-12-03 ENCOUNTER — Other Ambulatory Visit: Payer: Self-pay | Admitting: Oncology

## 2013-12-03 DIAGNOSIS — C801 Malignant (primary) neoplasm, unspecified: Secondary | ICD-10-CM

## 2013-12-03 MED ORDER — ERLOTINIB HCL 150 MG PO TABS: 150.0000 mg | ORAL_TABLET | Freq: Every day | ORAL | Status: DC

## 2013-12-06 ENCOUNTER — Encounter (HOSPITAL_COMMUNITY): Payer: Self-pay

## 2013-12-06 ENCOUNTER — Encounter: Payer: Self-pay | Admitting: Oncology

## 2013-12-06 NOTE — Progress Notes (Signed)
Celso Amy approved the patient to receive free tarceva from 12/07/13-12/08/14

## 2013-12-08 ENCOUNTER — Other Ambulatory Visit: Payer: Self-pay | Admitting: Medical Oncology

## 2013-12-08 MED ORDER — OXYCODONE HCL 5 MG PO TABS: 5.0000 mg | ORAL_TABLET | Freq: Four times a day (QID) | ORAL | Status: DC | PRN

## 2013-12-08 NOTE — Telephone Encounter (Signed)
Patient called to inform office that his Tarceva prescription arrived and asking when he should start taking it? Also states he has quite a bit of phlegm and having back and chest pain, asking for refill of oxycodone and can he take OTC mucinex. Review with MD, patient to start Tarceva today, advised patient to take one hour before a meal or two hours after a meal, but needs to take on an empty stomach. Informed him OTC mucinex ok and pain medication prescription ready for pick up. Patient expressed thanks, knows to p.u prescription at our clinic.

## 2013-12-13 ENCOUNTER — Telehealth: Payer: Self-pay | Admitting: *Deleted

## 2013-12-13 NOTE — Telephone Encounter (Signed)
Verbal order received and read back from Dr. Alvy Bimler on call provider for Chad George to 1. Stop taking the tarceva.  2. Take pictures of the areas of concern.  3. Try benadryl and Hydrocortisone cream.  4. No orders for mouth until seen by Dr. Alen Blew.  Order given to [facility] at this time to all of the above and await call from this office with any change in appointments for Dr. Alen Blew.  Reviewed with him to eat things that are soft and easy to swallow avoiding alcohol, acidic juices, heavily seasoned, spicy foods and hot temperature foods as described in our eating hints book.  He verbalized understaning.

## 2013-12-13 NOTE — Telephone Encounter (Addendum)
   Provider input needed: Rash, Thrush   Reason for call: rash started Friday  Ears, nose, mouth, throat, and face: positive for sore mouth, sore throat and red spots in mouth, chest and back.   ALLERGIES:  is allergic to amoxicillin-pot clavulanate and penicillins.  Patient last received chemotherapy/ treatment on Tarceva 150 mg ordered on 12-03-2013  Patient was last seen in the office on New patient on 11-26-2013  Next appt is 01-05-2014  Is patient having fevers greater than 100.5?  no   Is patient having uncontrolled pain, or new pain? yes, New pain to throat, chest, with rash to chest and back   Is patient having new back pain that changes with position (worsens or eases when laying down?)  no   Is patient able to eat and drink? yes    Is patient able to pass stool without difficulty?   yes     Is patient having uncontrolled nausea?  no    patient calls 12/13/2013 with complaint of  Ears, nose, mouth, throat, and face: negative, Patient is experiencing "dry mouth, trouble swallowing with pain in his throat and red spots under tongue, lips and nose.  This is worrisome".    Summary Based on the above information advised patient to  C.A.L.M. Skin or clean, assess, limit touching, moisturize as Tarceva irritates skin.  This nurse will notify providers and call him at [phone] with any further instructions or orders.  Uses [facility].   Winston-Spruiell, Hosea Hanawalt  12/13/2013, 11:56 AM   Background Info  Noeh Sparacino   DOB: 1971/01/27   MR#: [id number]   CSN#   [id number] 12/13/2013

## 2013-12-14 ENCOUNTER — Ambulatory Visit (HOSPITAL_BASED_OUTPATIENT_CLINIC_OR_DEPARTMENT_OTHER): Payer: No Typology Code available for payment source | Admitting: Oncology

## 2013-12-14 ENCOUNTER — Encounter: Payer: Self-pay | Admitting: Oncology

## 2013-12-14 ENCOUNTER — Telehealth: Payer: Self-pay | Admitting: Medical Oncology

## 2013-12-14 ENCOUNTER — Telehealth: Payer: Self-pay | Admitting: Oncology

## 2013-12-14 ENCOUNTER — Other Ambulatory Visit: Payer: Self-pay | Admitting: Oncology

## 2013-12-14 VITALS — BP 118/70 | HR 75 | Temp 98.3°F | Resp 20 | Ht 66.0 in | Wt 163.7 lb

## 2013-12-14 DIAGNOSIS — I10 Essential (primary) hypertension: Secondary | ICD-10-CM

## 2013-12-14 DIAGNOSIS — C3410 Malignant neoplasm of upper lobe, unspecified bronchus or lung: Secondary | ICD-10-CM

## 2013-12-14 DIAGNOSIS — C801 Malignant (primary) neoplasm, unspecified: Secondary | ICD-10-CM

## 2013-12-14 DIAGNOSIS — L251 Unspecified contact dermatitis due to drugs in contact with skin: Secondary | ICD-10-CM

## 2013-12-14 DIAGNOSIS — R059 Cough, unspecified: Secondary | ICD-10-CM

## 2013-12-14 DIAGNOSIS — R05 Cough: Secondary | ICD-10-CM

## 2013-12-14 DIAGNOSIS — R0602 Shortness of breath: Secondary | ICD-10-CM

## 2013-12-14 MED ORDER — DOXYCYCLINE HYCLATE 100 MG PO CAPS: 100.0000 mg | ORAL_CAPSULE | Freq: Two times a day (BID) | ORAL | Status: DC

## 2013-12-14 MED ORDER — PREDNISONE 5 MG PO TABS
ORAL_TABLET | ORAL | Status: DC
Start: 2013-12-14 — End: 2013-12-17

## 2013-12-14 NOTE — Telephone Encounter (Signed)
gave pt appt for lab and md for April 10th

## 2013-12-14 NOTE — Telephone Encounter (Signed)
Patient LVMOM reporting rash "become worse"  Today, states face is red and swollen and "feel more rash in throat." Reviewed with MD, per MD, patient to be seen today @ 2pm.   Call to patient, states he can come in at 2 and will be here. Schedulers notified.

## 2013-12-14 NOTE — Progress Notes (Signed)
Hematology and Oncology Follow Up Visit  Chad George [id number] 09-24-1970 43 y.o. 12/14/2013 3:17 PM ChadGeorge, [name], Chad Dowdy, NP   Principle Diagnosis: 43 year old gentleman with stage IV adenocarcinoma of the lung diagnosed in March of 2015. He presented with left upper lobe lung mass measuring 5.4 x 4.7 x 3.7 cm and bilateral pulmonary nodules. Pathology indicating adenocarcinoma of a lung primary. 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   Current therapy: Tarceva 150 mg daily started in March of 2015.  Interim History:  Chad George presents today for a walk in evaluation due to have reported complications to Tarceva. He reported starting taken Tarceva around a March 30 or so and after taking it for 3 or 4 days he developed rash on his neck and chest which he was expecting. On 12/13/2013 he developed more significant rash on his face and his mouth. At that time he was instructed to start to Tarceva and apply hydrocortisone cream. Today he called and concern about his symptoms and feeling that the rash is getting worse. He has not reported any fevers or chills or sweats. He has not reported any difficulty breathing or stridor. He has not reported any difficulty swallowing or dehydration. He did report grade 1-2 diarrhea as well as grade 1 fatigue.   Medications: I have reviewed the patient's current medications.  Current Outpatient Prescriptions  Medication Sig Dispense Refill  . acetaminophen (TYLENOL) 325 MG tablet Take 650 mg by mouth every 6 (six) hours as needed.      Marland Kitchen albuterol (PROVENTIL HFA;VENTOLIN HFA) 108 (90 BASE) MCG/ACT inhaler Inhale 2 puffs into the lungs every 6 (six) hours as needed for wheezing or shortness of breath.  1 Inhaler  0  . benzonatate (TESSALON) 100 MG capsule Take 1 capsule (100 mg total) by mouth 3 (three) times daily as needed for cough.  30 capsule  0  . chlorpheniramine-HYDROcodone (TUSSIONEX PENNKINETIC ER) 10-8  MG/5ML LQCR Take 5 mLs by mouth every 12 (twelve) hours as needed for cough.  1 Bottle  0  . doxycycline (VIBRAMYCIN) 100 MG capsule Take 1 capsule (100 mg total) by mouth 2 (two) times daily.  28 capsule  1  . erlotinib (TARCEVA) 150 MG tablet Take 1 tablet (150 mg total) by mouth daily. Take on an empty stomach 1 hour before meals or 2 hours after.  30 tablet  0  . fluticasone (FLONASE) 50 MCG/ACT nasal spray Place 2 sprays into both nostrils daily.  16 g  6  . loratadine (CLARITIN) 10 MG tablet Take 1 tablet (10 mg total) by mouth daily.  30 tablet  11  . oxyCODONE (OXY IR/ROXICODONE) 5 MG immediate release tablet Take 1-2 tablets (5-10 mg total) by mouth every 6 (six) hours as needed for severe pain.  30 tablet  0  . predniSONE (DELTASONE) 5 MG tablet Take 4 tablets daily for four days.  Take 3 tablets for 3 days.  Take 2 tablets for 3 days.  Take one tablet for 3 days then stop  100 tablet  0   No current facility-administered medications for this visit.     Allergies:  Allergies  Allergen Reactions  . Amoxicillin-Pot Clavulanate   . Penicillins     REACTION: RASH    Past Medical History, Surgical history, Social history, and Family History were reviewed and updated.  Review of Systems: Constitutional:  Negative for fever, chills, night sweats, anorexia, weight loss, pain. Cardiovascular: no chest pain  or dyspnea on exertion Respiratory: no cough, shortness of breath, or wheezing Neurological: no TIA or stroke symptoms Dermatological: he reports rash on his face and back.  ENT: negative Skin: Negative. Gastrointestinal: no abdominal pain, change in bowel habits, or black or bloody stools Genito-Urinary: no dysuria, trouble voiding, or hematuria Hematological and Lymphatic: negative for - fatigue, pallor or weight loss Breast: negative Musculoskeletal: negative for - joint pain or muscular weakness Remaining ROS negative. Physical Exam: Blood pressure 118/70, pulse 75,  temperature 98.3 F (36.8 C), temperature source Oral, resp. rate 20, height _0  (1.676 m), weight 163 lb 11.2 oz (74.254 kg), SpO2 100.00%. ECOG: 0 General appearance: alert and cooperative Head: Normocephalic, without obvious abnormality, atraumatic Neck: no adenopathy, no carotid bruit, no JVD, supple, symmetrical, trachea midline and thyroid not enlarged, symmetric, no tenderness/mass/nodules Lymph nodes: Cervical, supraclavicular, and axillary nodes normal. Heart:regular rate and rhythm, S1, S2 normal, no murmur, click, rub or gallop Lung:bilateral basilar rales heard. No wheezing or stridor.  Abdomin: soft, non-tender, without masses or organomegaly EXT:no erythema, induration, or nodules Skin: Diffuse acneiform rash noted on his chest and back. Erythema noted on his cheeks and face as well as inside of his lips. His oral mucosal did not show any masses or lesions.  Lab Results: Lab Results  Component Value Date   WBC 5.4 11/19/2013   HGB 14.9 11/19/2013   HCT 40.3 11/19/2013   MCV 82.9 11/19/2013   PLT 227 11/19/2013     Chemistry      Component Value Date/Time   NA 141 11/18/2013 0457   K 3.4* 11/18/2013 0457   CL 104 11/18/2013 0457   CO2 22 11/18/2013 0457   BUN 15 11/18/2013 0457   CREATININE 0.82 11/18/2013 0457   CREATININE 0.69 08/16/2013 1727      Component Value Date/Time   CALCIUM 9.1 11/18/2013 0457   ALKPHOS 124* 11/16/2013 0630   AST 18 11/16/2013 0630   ALT 17 11/16/2013 0630   BILITOT 0.6 11/16/2013 0630       Impression and Plan:   43 year old gentleman with the following issues:  1. Stage IV adenocarcinoma of the lung with a left lung mass and bilateral pulmonary nodules. His tumor is EGFR mutated but the ALK mutation was not detected. After less than a week of  Tarceva, he developed severe acneiform rash that required treatment interruption. I plan to resume Tarceva in the next few days after his symptoms improved to a grade 1 or less. I plan to resume at 150  mg daily. If he continues to have similar symptoms after the restart I will dose reduce him to 100 mg daily.   2. Cough and shortness of breath: This is related to #1 seems to be improving at this time.   3.  Acneiform rash: This is related to Tarceva and certainly more intense on the face for compared to the trunk. I gave him prescription for doxycycline 100 mg to take twice a day for at least the next 2 weeks. I've also given him prednisone taper to take 20 mg daily for 4 days and tapering it with instructions to be off in the next few weeks. I also instructed him to use hydrocortisone cream on the affected areas.  4. Follow up:  He will be evaluated this Friday on 12/17/2013 to check the severity of his rash. If his rash has improved to grade 1 or less we can resume his Tarceva at the previously prescribed dose. If his  symptoms persist we will delay the start until next week. He has also an M.D. Follow up on 01/05/2014.   Zola Button, MD 4/7/20153:17 PM

## 2013-12-14 NOTE — Telephone Encounter (Signed)
He needs to [facility]  [place] on 4/10. POF sent.

## 2013-12-15 LAB — FUNGUS CULTURE W SMEAR: FUNGAL SMEAR: NONE SEEN

## 2013-12-17 ENCOUNTER — Ambulatory Visit (HOSPITAL_BASED_OUTPATIENT_CLINIC_OR_DEPARTMENT_OTHER): Payer: No Typology Code available for payment source | Admitting: Oncology

## 2013-12-17 ENCOUNTER — Telehealth: Payer: Self-pay | Admitting: Oncology

## 2013-12-17 ENCOUNTER — Other Ambulatory Visit (HOSPITAL_BASED_OUTPATIENT_CLINIC_OR_DEPARTMENT_OTHER): Payer: No Typology Code available for payment source

## 2013-12-17 ENCOUNTER — Encounter: Payer: Self-pay | Admitting: Oncology

## 2013-12-17 VITALS — BP 122/72 | HR 68 | Temp 98.2°F | Resp 19 | Ht 66.0 in | Wt 165.5 lb

## 2013-12-17 DIAGNOSIS — C801 Malignant (primary) neoplasm, unspecified: Secondary | ICD-10-CM

## 2013-12-17 DIAGNOSIS — C3410 Malignant neoplasm of upper lobe, unspecified bronchus or lung: Secondary | ICD-10-CM

## 2013-12-17 DIAGNOSIS — L708 Other acne: Secondary | ICD-10-CM

## 2013-12-17 LAB — COMPREHENSIVE METABOLIC PANEL (CC13)
ALK PHOS: 200 U/L — AB (ref 40–150)
ALT: 20 U/L (ref 0–55)
AST: 20 U/L (ref 5–34)
Albumin: 4 g/dL (ref 3.5–5.0)
Anion Gap: 9 mEq/L (ref 3–11)
BUN: 11.2 mg/dL (ref 7.0–26.0)
CO2: 26 mEq/L (ref 22–29)
CREATININE: 0.8 mg/dL (ref 0.7–1.3)
Calcium: 9.3 mg/dL (ref 8.4–10.4)
Chloride: 107 mEq/L (ref 98–109)
Glucose: 95 mg/dl (ref 70–140)
Potassium: 3.3 mEq/L — ABNORMAL LOW (ref 3.5–5.1)
Sodium: 142 mEq/L (ref 136–145)
Total Bilirubin: 0.75 mg/dL (ref 0.20–1.20)
Total Protein: 7.1 g/dL (ref 6.4–8.3)

## 2013-12-17 LAB — CBC WITH DIFFERENTIAL/PLATELET
BASO%: 0.3 % (ref 0.0–2.0)
BASOS ABS: 0 10*3/uL (ref 0.0–0.1)
EOS%: 3.1 % (ref 0.0–7.0)
Eosinophils Absolute: 0.3 10*3/uL (ref 0.0–0.5)
HEMATOCRIT: 45.2 % (ref 38.4–49.9)
HEMOGLOBIN: 15.6 g/dL (ref 13.0–17.1)
LYMPH%: 26.5 % (ref 14.0–49.0)
MCH: 29.7 pg (ref 27.2–33.4)
MCHC: 34.5 g/dL (ref 32.0–36.0)
MCV: 86.1 fL (ref 79.3–98.0)
MONO#: 0.6 10*3/uL (ref 0.1–0.9)
MONO%: 7.1 % (ref 0.0–14.0)
NEUT#: 5.2 10*3/uL (ref 1.5–6.5)
NEUT%: 63 % (ref 39.0–75.0)
Platelets: 259 10*3/uL (ref 140–400)
RBC: 5.25 10*6/uL (ref 4.20–5.82)
RDW: 12.5 % (ref 11.0–14.6)
WBC: 8.2 10*3/uL (ref 4.0–10.3)
lymph#: 2.2 10*3/uL (ref 0.9–3.3)

## 2013-12-17 MED ORDER — PREDNISONE 20 MG PO TABS: 20.0000 mg | ORAL_TABLET | Freq: Every day | ORAL | Status: DC

## 2013-12-17 NOTE — Progress Notes (Signed)
Hematology and Oncology Follow Up Visit  Chad George [id number] 05-26-1971 43 y.o. 12/17/2013 2:33 PM Chad George,Chad George, [name], Chad Dowdy, NP   Principle Diagnosis: 43 year old gentleman with stage IV adenocarcinoma of the lung diagnosed in March of 2015. He presented with left upper lobe lung mass measuring 5.4 x 4.7 x 3.7 cm and bilateral pulmonary nodules. Pathology indicating adenocarcinoma of a lung primary. 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   Current therapy: Tarceva 150 mg daily started in March of 2015. Placed on hold 12/14/13 due to rash.  Interim History:  Chad George presents today for follow-up of his rash. He was seen on 4/7 and was instructed to hold Tarceva and was started on Doxycycline, Prednisone, and Hydrocortisone cream. He has been applying Hydrocortisone to face, but not to chest or back. Rash has worsened. Rash to face, chest, and back is now itching more and painful.  Also reports itching to scalp. He has not reported any fevers or chills or sweats. He has not reported any difficulty breathing or stridor. He has not reported any difficulty swallowing or dehydration.   Medications: I have reviewed the patient's current medications.  Current Outpatient Prescriptions  Medication Sig Dispense Refill  . benzonatate (TESSALON) 100 MG capsule Take 1 capsule (100 mg total) by mouth 3 (three) times daily as needed for cough.  30 capsule  0  . doxycycline (VIBRAMYCIN) 100 MG capsule Take 1 capsule (100 mg total) by mouth 2 (two) times daily.  28 capsule  1  . loratadine (CLARITIN) 10 MG tablet Take 1 tablet (10 mg total) by mouth daily.  30 tablet  11  . oxyCODONE (OXY IR/ROXICODONE) 5 MG immediate release tablet Take 1-2 tablets (5-10 mg total) by mouth every 6 (six) hours as needed for severe pain.  30 tablet  0  . acetaminophen (TYLENOL) 325 MG tablet Take 650 mg by mouth every 6 (six) hours as needed.      Marland Kitchen albuterol (PROVENTIL HFA;VENTOLIN  HFA) 108 (90 BASE) MCG/ACT inhaler Inhale 2 puffs into the lungs every 6 (six) hours as needed for wheezing or shortness of breath.  1 Inhaler  0  . chlorpheniramine-HYDROcodone (TUSSIONEX PENNKINETIC ER) 10-8 MG/5ML LQCR Take 5 mLs by mouth every 12 (twelve) hours as needed for cough.  1 Bottle  0  . erlotinib (TARCEVA) 150 MG tablet Take 1 tablet (150 mg total) by mouth daily. Take on an empty stomach 1 hour before meals or 2 hours after.  30 tablet  0  . fluticasone (FLONASE) 50 MCG/ACT nasal spray Place 2 sprays into both nostrils daily.  16 g  6  . predniSONE (DELTASONE) 20 MG tablet Take 1 tablet (20 mg total) by mouth daily with breakfast. Take 40 mg (2 tabs) daily for 1 week, then 30 mg (1.5 tabs) daily for 1 week, then 20 mg (1 tab) daily for 1 week), then 10 mg (1/2 tab) daily for 1 week, then 5 mg daily for 1 week, then stop.  120 tablet  0   No current facility-administered medications for this visit.     Allergies:  Allergies  Allergen Reactions  . Amoxicillin-Pot Clavulanate   . Penicillins     REACTION: RASH    Past Medical History, Surgical history, Social history, and Family History were reviewed and updated.  Review of Systems: Constitutional:  Negative for fever, chills, night sweats, anorexia, weight loss, pain. Cardiovascular: no chest pain or dyspnea on exertion Respiratory: no cough,  shortness of breath, or wheezing Neurological: no TIA or stroke symptoms Dermatological: he reports rash on his face and back.  ENT: negative Skin: Negative. Gastrointestinal: no abdominal pain, change in bowel habits, or black or bloody stools Genito-Urinary: no dysuria, trouble voiding, or hematuria Hematological and Lymphatic: negative for - fatigue, pallor or weight loss Breast: negative Musculoskeletal: negative for - joint pain or muscular weakness Remaining ROS negative.  Physical Exam: Blood pressure 122/72, pulse 68, temperature 98.2 F (36.8 C), temperature source  Oral, resp. rate 19, height $RemoveBe'5\' 6"'MJMqJoTyk$  (1.676 m), weight 165 lb 8 oz (75.07 kg). ECOG: 0 General appearance: alert and cooperative Head: Normocephalic, without obvious abnormality, atraumatic Neck: no adenopathy, no carotid bruit, no JVD, supple, symmetrical, trachea midline and thyroid not enlarged, symmetric, no tenderness/mass/nodules Lymph nodes: Cervical, supraclavicular, and axillary nodes normal. Heart:regular rate and rhythm, S1, S2 normal, no murmur, click, rub or gallop Lung:bilateral basilar rales heard. No wheezing or stridor.  Abdomen: soft, non-tender, without masses or organomegaly EXT:no erythema, induration, or nodules Skin: Diffuse acneiform rash noted on his chest and back. Erythema noted on his cheeks and face as well as inside of his lips. His oral mucosal did not show any masses or lesions.  Lab Results: Lab Results  Component Value Date   WBC 8.2 12/17/2013   HGB 15.6 12/17/2013   HCT 45.2 12/17/2013   MCV 86.1 12/17/2013   PLT 259 12/17/2013     Chemistry      Component Value Date/Time   NA 142 12/17/2013 0927   NA 141 11/18/2013 0457   K 3.3* 12/17/2013 0927   K 3.4* 11/18/2013 0457   CL 104 11/18/2013 0457   CO2 26 12/17/2013 0927   CO2 22 11/18/2013 0457   BUN 11.2 12/17/2013 0927   BUN 15 11/18/2013 0457   CREATININE 0.8 12/17/2013 0927   CREATININE 0.82 11/18/2013 0457   CREATININE 0.69 08/16/2013 1727      Component Value Date/Time   CALCIUM 9.3 12/17/2013 0927   CALCIUM 9.1 11/18/2013 0457   ALKPHOS 200* 12/17/2013 0927   ALKPHOS 124* 11/16/2013 0630   AST 20 12/17/2013 0927   AST 18 11/16/2013 0630   ALT 20 12/17/2013 0927   ALT 17 11/16/2013 0630   BILITOT 0.75 12/17/2013 0927   BILITOT 0.6 11/16/2013 0630       Impression and Plan:   43 year old gentleman with the following issues:  1. Stage IV adenocarcinoma of the lung with a left lung mass and bilateral pulmonary nodules. His tumor is EGFR mutated but the ALK mutation was not detected. After less than a  week of  Tarceva, he developed severe acneiform rash that required treatment interruption. Will continue to hold Tarceva. Will plan to resume Tarceva in the next few days after his symptoms improved to a grade 1 or less. I plan to resume at 150 mg daily. If he continues to have similar symptoms after the restart I will dose reduce him to 100 mg daily.   2. Cough and shortness of breath: This is related to #1 seems to be improving at this time.   3.  Acneiform rash: This is related to Tarceva and certainly more intense on the face for compared to the trunk. Continue Doxycycline 100 mg BID. Will increase Prednisone to 40 mg daily for at least 1 week and then will begin to tape when rash improves.  He was instructed Hydrocortisone to face, chest, and back. He was also instructed to use moisturizer, such  as Aveeno Baby Eczema cream. He may use Neutrogena T-gel for itching to his scalp.    4. Follow up:  I will see him again in 1 week to check the severity of his rash. If his rash has improved to grade 1 or less we can resume his Tarceva at the previously prescribed dose. If his symptoms persist we will delay the start until next week. He has also an M.D. Follow up on 01/05/2014.   Maryanna Shape DNP, AGPCNP-BC 4/10/20152:33 PM   Patient seen and examined today. He continues to have worsening of his rash on the face and the scalp area. He is not having any problem swallowing or difficulty breathing.   On physical examination he was awake alert did not appear in any distress. Actiniform rash was more spread on the scalp and the face but the intensity of the redness is actually improving slightly. I did notice improvement on the chest wall and the back involvement.  Laboratory data were reviewed showed normal CBC and chemistries.  Impression and plan: We'll continue to hold proceed the time being will intensify his systemic steroid as well as increase his use moisturizer cream's and we will assess him  next week to monitor his progress. I anticipate restarting Tarceva at a lower dose given the severity of his reaction.  Wyatt Portela MD 12/17/2013

## 2013-12-17 NOTE — Telephone Encounter (Signed)
Gave pt appt for lab and ML on April 2015

## 2013-12-24 ENCOUNTER — Ambulatory Visit (HOSPITAL_BASED_OUTPATIENT_CLINIC_OR_DEPARTMENT_OTHER): Payer: No Typology Code available for payment source | Admitting: Oncology

## 2013-12-24 ENCOUNTER — Other Ambulatory Visit (HOSPITAL_BASED_OUTPATIENT_CLINIC_OR_DEPARTMENT_OTHER): Payer: No Typology Code available for payment source

## 2013-12-24 ENCOUNTER — Encounter: Payer: Self-pay | Admitting: Oncology

## 2013-12-24 VITALS — BP 137/83 | HR 84 | Temp 98.2°F | Resp 20 | Ht 66.0 in | Wt 163.2 lb

## 2013-12-24 DIAGNOSIS — IMO0002 Reserved for concepts with insufficient information to code with codable children: Secondary | ICD-10-CM

## 2013-12-24 DIAGNOSIS — C3410 Malignant neoplasm of upper lobe, unspecified bronchus or lung: Secondary | ICD-10-CM

## 2013-12-24 DIAGNOSIS — C7800 Secondary malignant neoplasm of unspecified lung: Secondary | ICD-10-CM

## 2013-12-24 DIAGNOSIS — C3490 Malignant neoplasm of unspecified part of unspecified bronchus or lung: Secondary | ICD-10-CM

## 2013-12-24 DIAGNOSIS — L708 Other acne: Secondary | ICD-10-CM

## 2013-12-24 DIAGNOSIS — C801 Malignant (primary) neoplasm, unspecified: Secondary | ICD-10-CM

## 2013-12-24 LAB — CBC WITH DIFFERENTIAL/PLATELET
BASO%: 0.7 % (ref 0.0–2.0)
BASOS ABS: 0.1 10*3/uL (ref 0.0–0.1)
EOS ABS: 0.1 10*3/uL (ref 0.0–0.5)
EOS%: 0.9 % (ref 0.0–7.0)
HCT: 47.4 % (ref 38.4–49.9)
HEMOGLOBIN: 16.2 g/dL (ref 13.0–17.1)
LYMPH%: 18.7 % (ref 14.0–49.0)
MCH: 29.5 pg (ref 27.2–33.4)
MCHC: 34.2 g/dL (ref 32.0–36.0)
MCV: 86.3 fL (ref 79.3–98.0)
MONO#: 0.4 10*3/uL (ref 0.1–0.9)
MONO%: 4.8 % (ref 0.0–14.0)
NEUT%: 74.9 % (ref 39.0–75.0)
NEUTROS ABS: 6 10*3/uL (ref 1.5–6.5)
PLATELETS: 279 10*3/uL (ref 140–400)
RBC: 5.49 10*6/uL (ref 4.20–5.82)
RDW: 13 % (ref 11.0–14.6)
WBC: 8 10*3/uL (ref 4.0–10.3)
lymph#: 1.5 10*3/uL (ref 0.9–3.3)

## 2013-12-24 LAB — COMPREHENSIVE METABOLIC PANEL (CC13)
ALK PHOS: 192 U/L — AB (ref 40–150)
ALT: 32 U/L (ref 0–55)
ANION GAP: 9 meq/L (ref 3–11)
AST: 17 U/L (ref 5–34)
Albumin: 3.9 g/dL (ref 3.5–5.0)
BILIRUBIN TOTAL: 0.84 mg/dL (ref 0.20–1.20)
BUN: 11.7 mg/dL (ref 7.0–26.0)
CO2: 26 meq/L (ref 22–29)
Calcium: 9.5 mg/dL (ref 8.4–10.4)
Chloride: 107 mEq/L (ref 98–109)
Creatinine: 0.8 mg/dL (ref 0.7–1.3)
Glucose: 111 mg/dl (ref 70–140)
Potassium: 3.4 mEq/L — ABNORMAL LOW (ref 3.5–5.1)
Sodium: 142 mEq/L (ref 136–145)
Total Protein: 6.9 g/dL (ref 6.4–8.3)

## 2013-12-24 MED ORDER — DOXYCYCLINE HYCLATE 100 MG PO CAPS: 100.0000 mg | ORAL_CAPSULE | Freq: Two times a day (BID) | ORAL | Status: DC

## 2013-12-24 MED ORDER — OXYCODONE HCL 5 MG PO TABS: 5.0000 mg | ORAL_TABLET | Freq: Four times a day (QID) | ORAL | Status: DC | PRN

## 2013-12-24 NOTE — Progress Notes (Signed)
Hematology and Oncology Follow Up Visit  Chad George [id number] Nov 27, 1970 43 y.o. 12/24/2013 2:40 PM Chad,[name], [name], [name], NP   Principle Diagnosis: 43 year old gentleman with stage IV adenocarcinoma of the lung diagnosed in March of 2015. He presented with left upper lobe lung mass measuring 5.4 x 4.7 x 3.7 cm and bilateral pulmonary nodules. Pathology indicating adenocarcinoma of a lung primary. 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   Current therapy: Tarceva 150 mg daily started in March of 2015. Placed on hold 12/14/13 due to rash.  Interim History:  Mr. Waggle presents today for follow-up of his rash. He was seen on 4/7 and was instructed to hold Tarceva and was started on Doxycycline, Prednisone, and Hydrocortisone cream. Prednisone dose was increased last week due to worsening rash. Rash is almost resolved today. Using moisturizer and Hydrocortisone routinely. Remains on Prednisone and Doxycycline. He has not reported any fevers or chills or sweats. He has not reported any difficulty breathing or stridor. He has not reported any difficulty swallowing or dehydration.   Medications: I have reviewed the patient's current medications.  Current Outpatient Prescriptions  Medication Sig Dispense Refill  . acetaminophen (TYLENOL) 325 MG tablet Take 650 mg by mouth every 6 (six) hours as needed.      Marland Kitchen albuterol (PROVENTIL HFA;VENTOLIN HFA) 108 (90 BASE) MCG/ACT inhaler Inhale 2 puffs into the lungs every 6 (six) hours as needed for wheezing or shortness of breath.  1 Inhaler  0  . benzonatate (TESSALON) 100 MG capsule Take 1 capsule (100 mg total) by mouth 3 (three) times daily as needed for cough.  30 capsule  0  . doxycycline (VIBRAMYCIN) 100 MG capsule Take 1 capsule (100 mg total) by mouth 2 (two) times daily.  28 capsule  0  . loratadine (CLARITIN) 10 MG tablet Take 1 tablet (10 mg total) by mouth daily.  30 tablet  11  . oxyCODONE (OXY  IR/ROXICODONE) 5 MG immediate release tablet Take 1-2 tablets (5-10 mg total) by mouth every 6 (six) hours as needed for severe pain.  30 tablet  0  . predniSONE (DELTASONE) 20 MG tablet Take 1 tablet (20 mg total) by mouth daily with breakfast. Take 40 mg (2 tabs) daily for 1 week, then 30 mg (1.5 tabs) daily for 1 week, then 20 mg (1 tab) daily for 1 week), then 10 mg (1/2 tab) daily for 1 week, then 5 mg daily for 1 week, then stop.  120 tablet  0  . chlorpheniramine-HYDROcodone (TUSSIONEX PENNKINETIC ER) 10-8 MG/5ML LQCR Take 5 mLs by mouth every 12 (twelve) hours as needed for cough.  1 Bottle  0  . erlotinib (TARCEVA) 150 MG tablet Take 1 tablet (150 mg total) by mouth daily. Take on an empty stomach 1 hour before meals or 2 hours after.  30 tablet  0  . fluticasone (FLONASE) 50 MCG/ACT nasal spray Place 2 sprays into both nostrils daily.  16 g  6   No current facility-administered medications for this visit.     Allergies:  Allergies  Allergen Reactions  . Amoxicillin-Pot Clavulanate   . Penicillins     REACTION: RASH    Past Medical History, Surgical history, Social history, and Family History were reviewed and updated.  Review of Systems: Constitutional:  Negative for fever, chills, night sweats, anorexia, weight loss, pain. Cardiovascular: no chest pain or dyspnea on exertion Respiratory: no cough, shortness of breath, or wheezing Neurological: no TIA or  stroke symptoms Dermatological: Rash has resolved.  ENT: negative Skin: Negative. Gastrointestinal: no abdominal pain, change in bowel habits, or black or bloody stools Genito-Urinary: no dysuria, trouble voiding, or hematuria Hematological and Lymphatic: negative for - fatigue, pallor or weight loss Breast: negative Musculoskeletal: negative for - joint pain or muscular weakness Remaining ROS negative.  Physical Exam: Blood pressure 137/83, pulse 84, temperature 98.2 F (36.8 C), temperature source Oral, resp. rate 20,  height _0  (1.676 m), weight 163 lb 3.2 oz (74.027 kg), SpO2 100.00%. ECOG: 0 General appearance: alert and cooperative Head: Normocephalic, without obvious abnormality, atraumatic Neck: no adenopathy, no carotid bruit, no JVD, supple, symmetrical, trachea midline and thyroid not enlarged, symmetric, no tenderness/mass/nodules Lymph nodes: Cervical, supraclavicular, and axillary nodes normal. Heart:regular rate and rhythm, S1, S2 normal, no murmur, click, rub or gallop Lung:bilateral basilar rales heard. No wheezing or stridor.  Abdomen: soft, non-tender, without masses or organomegaly EXT:no erythema, induration, or nodules Skin: No rashes.  Lab Results: Lab Results  Component Value Date   WBC 8.0 12/24/2013   HGB 16.2 12/24/2013   HCT 47.4 12/24/2013   MCV 86.3 12/24/2013   PLT 279 12/24/2013     Chemistry      Component Value Date/Time   NA 142 12/24/2013 1312   NA 141 11/18/2013 0457   K 3.4* 12/24/2013 1312   K 3.4* 11/18/2013 0457   CL 104 11/18/2013 0457   CO2 26 12/24/2013 1312   CO2 22 11/18/2013 0457   BUN 11.7 12/24/2013 1312   BUN 15 11/18/2013 0457   CREATININE 0.8 12/24/2013 1312   CREATININE 0.82 11/18/2013 0457   CREATININE 0.69 08/16/2013 1727      Component Value Date/Time   CALCIUM 9.5 12/24/2013 1312   CALCIUM 9.1 11/18/2013 0457   ALKPHOS 192* 12/24/2013 1312   ALKPHOS 124* 11/16/2013 0630   AST 17 12/24/2013 1312   AST 18 11/16/2013 0630   ALT 32 12/24/2013 1312   ALT 17 11/16/2013 0630   BILITOT 0.84 12/24/2013 1312   BILITOT 0.6 11/16/2013 0630       Impression and Plan:   43 year old gentleman with the following issues:  1. Stage IV adenocarcinoma of the lung with a left lung mass and bilateral pulmonary nodules. His tumor is EGFR mutated but the ALK mutation was not detected. After less than a week of  Tarceva, he developed severe acneiform rash that required treatment interruption. He was instructed to resume Tarceva at 150 mg daily. If he continues to have  similar symptoms after the restart I will dose reduce him to 100 mg daily.   2. Cough and shortness of breath: This is related to #1 seems to be improving at this time.   3.  Acneiform rash: Significantly improved. Continue slow taper of Prednisone. Continue Doxycycline, Hydrocortisone, and moisturizers.    4. Follow up:  01/05/14 as scheduled  Blanchard, AGPCNP-BC 4/17/20152:40 PM   Patient seen and examined today and the plan discussed extensively with patient and his family. He appears to be doing well from a rash perspective but is complaining of miscellaneous symptoms including headaches and insomnia. A lot of the side effects related to the steroids for the time being. Is not reporting any shortness of breath or hemoptysis.  On physical exam, he is awake alert gentleman appeared in no distress. He had significant improvement in his acneiform rash on the face back in chest area. Heart was regular rate and rhythm with clear lungs  on auscultation. Abdomen was soft without any neurological deficits.  Impression and plan: This is a pleasant gentleman with advanced adenocarcinoma of the lung after a brief period of Tarceva he developed grade 3 rash that required a drug interaction. At this time, his rash have resolved to grade 1 and less and we will resume Tarceva at 150 mg dosing. I've instructed him to continue on his hydrocortisone cream as well as steroid taper. I also would like to him to continue on doxycycline for the time being. He'll followup in 2 weeks to assess his clinical status.  Wyatt Portela MD 12/24/2013

## 2013-12-27 ENCOUNTER — Ambulatory Visit: Payer: No Typology Code available for payment source | Attending: Internal Medicine

## 2013-12-29 LAB — AFB CULTURE WITH SMEAR (NOT AT ARMC): Acid Fast Smear: NONE SEEN

## 2013-12-30 LAB — AFB CULTURE WITH SMEAR (NOT AT ARMC): Acid Fast Smear: NONE SEEN

## 2014-01-05 ENCOUNTER — Telehealth: Payer: Self-pay | Admitting: Oncology

## 2014-01-05 ENCOUNTER — Encounter: Payer: Self-pay | Admitting: Oncology

## 2014-01-05 ENCOUNTER — Other Ambulatory Visit (HOSPITAL_BASED_OUTPATIENT_CLINIC_OR_DEPARTMENT_OTHER): Payer: No Typology Code available for payment source

## 2014-01-05 ENCOUNTER — Ambulatory Visit (HOSPITAL_BASED_OUTPATIENT_CLINIC_OR_DEPARTMENT_OTHER): Payer: No Typology Code available for payment source | Admitting: Oncology

## 2014-01-05 VITALS — BP 136/79 | HR 77 | Temp 98.4°F | Resp 18 | Ht 66.0 in | Wt 163.9 lb

## 2014-01-05 DIAGNOSIS — C3410 Malignant neoplasm of upper lobe, unspecified bronchus or lung: Secondary | ICD-10-CM

## 2014-01-05 DIAGNOSIS — C801 Malignant (primary) neoplasm, unspecified: Secondary | ICD-10-CM

## 2014-01-05 DIAGNOSIS — R21 Rash and other nonspecific skin eruption: Secondary | ICD-10-CM

## 2014-01-05 DIAGNOSIS — C771 Secondary and unspecified malignant neoplasm of intrathoracic lymph nodes: Secondary | ICD-10-CM

## 2014-01-05 DIAGNOSIS — C3490 Malignant neoplasm of unspecified part of unspecified bronchus or lung: Secondary | ICD-10-CM

## 2014-01-05 LAB — CBC WITH DIFFERENTIAL/PLATELET
BASO%: 0.5 % (ref 0.0–2.0)
BASOS ABS: 0 10*3/uL (ref 0.0–0.1)
EOS ABS: 0.1 10*3/uL (ref 0.0–0.5)
EOS%: 2.6 % (ref 0.0–7.0)
HCT: 47.8 % (ref 38.4–49.9)
HGB: 16.1 g/dL (ref 13.0–17.1)
LYMPH%: 31 % (ref 14.0–49.0)
MCH: 29.7 pg (ref 27.2–33.4)
MCHC: 33.7 g/dL (ref 32.0–36.0)
MCV: 88.1 fL (ref 79.3–98.0)
MONO#: 0.4 10*3/uL (ref 0.1–0.9)
MONO%: 7.3 % (ref 0.0–14.0)
NEUT%: 58.6 % (ref 39.0–75.0)
NEUTROS ABS: 3.2 10*3/uL (ref 1.5–6.5)
Platelets: 236 10*3/uL (ref 140–400)
RBC: 5.42 10*6/uL (ref 4.20–5.82)
RDW: 12.9 % (ref 11.0–14.6)
WBC: 5.5 10*3/uL (ref 4.0–10.3)
lymph#: 1.7 10*3/uL (ref 0.9–3.3)

## 2014-01-05 LAB — COMPREHENSIVE METABOLIC PANEL (CC13)
ALBUMIN: 3.9 g/dL (ref 3.5–5.0)
ALT: 29 U/L (ref 0–55)
AST: 17 U/L (ref 5–34)
Alkaline Phosphatase: 164 U/L — ABNORMAL HIGH (ref 40–150)
Anion Gap: 12 mEq/L — ABNORMAL HIGH (ref 3–11)
BUN: 14.1 mg/dL (ref 7.0–26.0)
CALCIUM: 9.4 mg/dL (ref 8.4–10.4)
CO2: 26 mEq/L (ref 22–29)
Chloride: 105 mEq/L (ref 98–109)
Creatinine: 0.9 mg/dL (ref 0.7–1.3)
Glucose: 81 mg/dl (ref 70–140)
POTASSIUM: 3.4 meq/L — AB (ref 3.5–5.1)
SODIUM: 144 meq/L (ref 136–145)
TOTAL PROTEIN: 6.8 g/dL (ref 6.4–8.3)
Total Bilirubin: 1.81 mg/dL — ABNORMAL HIGH (ref 0.20–1.20)

## 2014-01-05 NOTE — Progress Notes (Signed)
Hematology and Oncology Follow Up Visit  Chad George [id number] 07/23/1971 43 y.o. 01/05/2014 9:50 AM ChadGeorge, [name], [name], NP   Principle Diagnosis: 43 year old gentleman with stage IV adenocarcinoma of the lung diagnosed in March of 2015. He presented with left upper lobe lung mass measuring 5.4 x 4.7 x 3.7 cm and bilateral pulmonary nodules. Pathology indicating adenocarcinoma of a lung primary. 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   Current therapy: Tarceva 150 mg daily started in March of 2015. Placed on hold 12/14/13 due to rash. This was resumed again on 12/24/2013.   Interim History:  Chad George presents today for follow-up visit. He was seen on 4/17 and was instructed to resumeTarceva and was instructed to continue Doxycycline, Prednisone taper, and Hydrocortisone cream. Prednisone dose is currently at 20 mg. After the resumption of Tarceva, his rash is almost resolved today. It is close to a grade 1 at best including the face and the chest area. He is still using moisturizer and Hydrocortisone routinely. Rem He has not reported any fevers or chills or sweats. He has not reported any difficulty breathing or stridor. He has not reported any difficulty swallowing or dehydration. He is noticing some clinical improvement in the last 2 weeks. He is sleeping better and his cough is improved. His performance status is slowly improving at this time. He is not reporting any headaches or blurry vision or complications from the steroids. Is not reporting any dyspepsia or hematochezia.  Medications: I have reviewed the patient's current medications.  Current Outpatient Prescriptions  Medication Sig Dispense Refill  . acetaminophen (TYLENOL) 325 MG tablet Take 650 mg by mouth every 6 (six) hours as needed.      Marland Kitchen albuterol (PROVENTIL HFA;VENTOLIN HFA) 108 (90 BASE) MCG/ACT inhaler Inhale 2 puffs into the lungs every 6 (six) hours as needed for wheezing or  shortness of breath.  1 Inhaler  0  . benzonatate (TESSALON) 100 MG capsule Take 1 capsule (100 mg total) by mouth 3 (three) times daily as needed for cough.  30 capsule  0  . chlorpheniramine-HYDROcodone (TUSSIONEX PENNKINETIC ER) 10-8 MG/5ML LQCR Take 5 mLs by mouth every 12 (twelve) hours as needed for cough.  1 Bottle  0  . doxycycline (VIBRAMYCIN) 100 MG capsule Take 1 capsule (100 mg total) by mouth 2 (two) times daily.  28 capsule  0  . erlotinib (TARCEVA) 150 MG tablet Take 1 tablet (150 mg total) by mouth daily. Take on an empty stomach 1 hour before meals or 2 hours after.  30 tablet  0  . fluticasone (FLONASE) 50 MCG/ACT nasal spray Place 2 sprays into both nostrils daily.  16 g  6  . loratadine (CLARITIN) 10 MG tablet Take 1 tablet (10 mg total) by mouth daily.  30 tablet  11  . oxyCODONE (OXY IR/ROXICODONE) 5 MG immediate release tablet Take 1-2 tablets (5-10 mg total) by mouth every 6 (six) hours as needed for severe pain.  30 tablet  0  . predniSONE (DELTASONE) 20 MG tablet Take 1 tablet (20 mg total) by mouth daily with breakfast. Take 40 mg (2 tabs) daily for 1 week, then 30 mg (1.5 tabs) daily for 1 week, then 20 mg (1 tab) daily for 1 week), then 10 mg (1/2 tab) daily for 1 week, then 5 mg daily for 1 week, then stop.  120 tablet  0   No current facility-administered medications for this visit.  Allergies:  Allergies  Allergen Reactions  . Amoxicillin-Pot Clavulanate   . Penicillins     REACTION: RASH    Past Medical History, Surgical history, Social history, and Family History were reviewed and updated.  Review of Systems: Constitutional:  Negative for fever, chills, night sweats, anorexia, weight loss, pain. Cardiovascular: no chest pain or dyspnea on exertion Respiratory: no cough, shortness of breath, or wheezing Neurological: no TIA or stroke symptoms Dermatological: Rash has resolved.  ENT: negative Skin: Negative. Gastrointestinal: no abdominal pain,  change in bowel habits, or black or bloody stools Genito-Urinary: no dysuria, trouble voiding, or hematuria Hematological and Lymphatic: negative for - fatigue, pallor or weight loss Breast: negative Musculoskeletal: negative for - joint pain or muscular weakness Remaining ROS negative.  Physical Exam: Blood pressure 136/79, pulse 77, temperature 98.4 F (36.9 C), temperature source Oral, resp. rate 18, height $RemoveBe'5\' 6"'cemHWUxDw$  (1.676 m), weight 163 lb 14.4 oz (74.345 kg), SpO2 98.00%. ECOG: 0 General appearance: alert and cooperative Head: Normocephalic, without obvious abnormality, atraumatic Neck: no adenopathy, no carotid bruit, no JVD, supple, symmetrical, trachea midline and thyroid not enlarged, symmetric, no tenderness/mass/nodules Lymph nodes: Cervical, supraclavicular, and axillary nodes normal. Heart:regular rate and rhythm, S1, S2 normal, no murmur, click, rub or gallop Lung:bilateral basilar rales heard. No wheezing or stridor.  Abdomen: soft, non-tender, without masses or organomegaly EXT:no erythema, induration, or nodules Skin: Very faint acneiform rash on the face and chest grade 1 at this point.  Lab Results: Lab Results  Component Value Date   WBC 5.5 01/05/2014   HGB 16.1 01/05/2014   HCT 47.8 01/05/2014   MCV 88.1 01/05/2014   PLT 236 01/05/2014     Chemistry      Component Value Date/Time   NA 142 12/24/2013 1312   NA 141 11/18/2013 0457   K 3.4* 12/24/2013 1312   K 3.4* 11/18/2013 0457   CL 104 11/18/2013 0457   CO2 26 12/24/2013 1312   CO2 22 11/18/2013 0457   BUN 11.7 12/24/2013 1312   BUN 15 11/18/2013 0457   CREATININE 0.8 12/24/2013 1312   CREATININE 0.82 11/18/2013 0457   CREATININE 0.69 08/16/2013 1727      Component Value Date/Time   CALCIUM 9.5 12/24/2013 1312   CALCIUM 9.1 11/18/2013 0457   ALKPHOS 192* 12/24/2013 1312   ALKPHOS 124* 11/16/2013 0630   AST 17 12/24/2013 1312   AST 18 11/16/2013 0630   ALT 32 12/24/2013 1312   ALT 17 11/16/2013 0630   BILITOT 0.84  12/24/2013 1312   BILITOT 0.6 11/16/2013 0630       Impression and Plan:   43 year old gentleman with the following issues:  1. Stage IV adenocarcinoma of the lung with a left lung mass and bilateral pulmonary nodules. His tumor is EGFR mutated but the ALK mutation was not detected. After 2 weeks of Tarceva, his rash has remained stable. I plan on to continue the current medication and then restage him with a CT scan in the first week of June. He'll followup with me after that in a few days to discuss these results.  2. Cough and shortness of breath: This is related to #1 seems to be improving at this time.   3.  Acneiform rash: Significantly improved. Continue slow taper of Prednisone. I instructed him to take 10 mg of prednisone for next week and subsequently 10 mg every other week. He will have a clinical visit to make sure that his rash does not recur with the  steroid taper. If his rash remains a grade 1 in 2 weeks, we will discontinue steroids and keep him on doxycycline.   4. Follow up:  01/21/2014 for a toxicity check.  Wyatt Portela MD 4/29/20159:50 AM

## 2014-01-05 NOTE — Telephone Encounter (Signed)
gave pt appt for lab,md and ML for May and June 2015

## 2014-01-13 ENCOUNTER — Telehealth: Payer: Self-pay | Admitting: *Deleted

## 2014-01-13 ENCOUNTER — Encounter: Payer: Self-pay | Admitting: Internal Medicine

## 2014-01-13 ENCOUNTER — Ambulatory Visit: Payer: No Typology Code available for payment source | Attending: Internal Medicine | Admitting: Internal Medicine

## 2014-01-13 VITALS — BP 114/74 | HR 87 | Temp 98.1°F | Resp 16

## 2014-01-13 DIAGNOSIS — L03032 Cellulitis of left toe: Secondary | ICD-10-CM | POA: Insufficient documentation

## 2014-01-13 DIAGNOSIS — C3490 Malignant neoplasm of unspecified part of unspecified bronchus or lung: Secondary | ICD-10-CM | POA: Insufficient documentation

## 2014-01-13 DIAGNOSIS — L03039 Cellulitis of unspecified toe: Secondary | ICD-10-CM

## 2014-01-13 MED ORDER — SULFAMETHOXAZOLE-TMP DS 800-160 MG PO TABS: 1.0000 | ORAL_TABLET | Freq: Two times a day (BID) | ORAL | Status: DC

## 2014-01-13 NOTE — Patient Instructions (Signed)
Paronychia Paronychia is an inflammatory reaction involving the folds of the skin surrounding the fingernail. This is commonly caused by an infection in the skin around a nail. The most common cause of paronychia is frequent wetting of the hands (as seen with bartenders, food servers, nurses or others who wet their hands). This makes the skin around the fingernail susceptible to infection by bacteria (germs) or fungus. Other predisposing factors are:  Aggressive manicuring.  Nail biting.  Thumb sucking. The most common cause is a staphylococcal (a type of germ) infection, or a fungal (Candida) infection. When caused by a germ, it usually comes on suddenly with redness, swelling, pus and is often painful. It may get under the nail and form an abscess (collection of pus), or form an abscess around the nail. If the nail itself is infected with a fungus, the treatment is usually prolonged and may require oral medicine for up to one year. Your caregiver will determine the length of time treatment is required. The paronychia caused by bacteria (germs) may largely be avoided by not pulling on hangnails or picking at cuticles. When the infection occurs at the tips of the finger it is called felon. When the cause of paronychia is from the herpes simplex virus (HSV) it is called herpetic whitlow. TREATMENT  When an abscess is present treatment is often incision and drainage. This means that the abscess must be cut open so the pus can get out. When this is done, the following home care instructions should be followed. HOME CARE INSTRUCTIONS   It is important to keep the affected fingers very dry. Rubber or plastic gloves over cotton gloves should be used whenever the hand must be placed in water.  Keep wound clean, dry and dressed as suggested by your caregiver between warm soaks or warm compresses.  Soak in warm water for fifteen to twenty minutes three to four times per day for bacterial infections. Fungal  infections are very difficult to treat, so often require treatment for long periods of time.  For bacterial (germ) infections take antibiotics (medicine which kill germs) as directed and finish the prescription, even if the problem appears to be solved before the medicine is gone.  Only take over-the-counter or prescription medicines for pain, discomfort, or fever as directed by your caregiver. SEEK IMMEDIATE MEDICAL CARE IF:  You have redness, swelling, or increasing pain in the wound.  You notice pus coming from the wound.  You have a fever.  You notice a bad smell coming from the wound or dressing. Document Released: 02/19/2001 Document Revised: 11/18/2011 Document Reviewed: 10/21/2008 Exit[employer] Patient Information 2014 [employer].  

## 2014-01-13 NOTE — Telephone Encounter (Signed)
Patient called today saying on left foot the great toe about 3-4 days ago toe started having swelling and puss come out of great toe. Patient states soaked left foot in warm to hot water. Patient states its painful, still having puss and swelling in the toe. Patient scheduled for same day visit today (01/13/2014) at 11:15 AM. Alverda Skeans, RN

## 2014-01-13 NOTE — Progress Notes (Signed)
Patient ID: Chad George, male   DOB: 09-Jan-1971, 43 y.o.   MRN: [id number]   Chad George, is a 43 y.o. male  YQM:[id number]  BMW:[id number]  DOB - [birth year]  Chief Complaint  Patient presents with  . Follow-up        Subjective:   Chad George is a 43 y.o. male here today for a follow up visit. Pt is here with an ingrown toenail in his left great toe starting 4 days ago, now the great toe is swollen and painful. He was recently diagnosed with stage IV lung cancer now on chemotherapy, he has followup with oncologist. He never smoked cigarettes and, he does not drink alcohol. Patient has No headache, No chest pain, No abdominal pain - No Nausea, No new weakness tingling or numbness, No Cough - SOB.  Problem  Paronychia of Great Toe of Left Foot  Lung Cancer    ALLERGIES: Allergies  Allergen Reactions  . Amoxicillin-Pot Clavulanate   . Penicillins     REACTION: RASH    PAST MEDICAL HISTORY: Past Medical History  Diagnosis Date  . High cholesterol   . Pneumonia   . Adenocarcinoma 11/21/2013    MEDICATIONS AT HOME: Prior to Admission medications   Medication Sig Start Date End Date Taking? Authorizing Provider  acetaminophen (TYLENOL) 325 MG tablet Take 650 mg by mouth every 6 (six) hours as needed.   Yes Historical Provider, MD  albuterol (PROVENTIL HFA;VENTOLIN HFA) 108 (90 BASE) MCG/ACT inhaler Inhale 2 puffs into the lungs every 6 (six) hours as needed for wheezing or shortness of breath. 10/16/13  Yes Reyne Dumas, MD  benzonatate (TESSALON) 100 MG capsule Take 1 capsule (100 mg total) by mouth 3 (three) times daily as needed for cough. 11/21/13  Yes Kelvin Cellar, MD  chlorpheniramine-HYDROcodone [facility] PENNKINETIC ER) 10-8 MG/5ML LQCR Take 5 mLs by mouth every 12 (twelve) hours as needed for cough. 11/21/13  Yes Kelvin Cellar, MD  doxycycline (VIBRAMYCIN) 100 MG capsule Take 1 capsule (100 mg total) by mouth 2 (two) times daily. 12/24/13  Yes Maryanna Shape, NP    erlotinib (TARCEVA) 150 MG tablet Take 1 tablet (150 mg total) by mouth daily. Take on an empty stomach 1 hour before meals or 2 hours after. 12/03/13  Yes Wyatt Portela, MD  fluticasone (FLONASE) 50 MCG/ACT nasal spray Place 2 sprays into both nostrils daily. 10/16/13  Yes Reyne Dumas, MD  loratadine (CLARITIN) 10 MG tablet Take 1 tablet (10 mg total) by mouth daily. 10/16/13  Yes Reyne Dumas, MD  oxyCODONE (OXY IR/ROXICODONE) 5 MG immediate release tablet Take 1-2 tablets (5-10 mg total) by mouth every 6 (six) hours as needed for severe pain. 12/24/13  Yes Maryanna Shape, NP  predniSONE (DELTASONE) 20 MG tablet Take 1 tablet (20 mg total) by mouth daily with breakfast. Take 40 mg (2 tabs) daily for 1 week, then 30 mg (1.5 tabs) daily for 1 week, then 20 mg (1 tab) daily for 1 week), then 10 mg (1/2 tab) daily for 1 week, then 5 mg daily for 1 week, then stop. 12/17/13  Yes Maryanna Shape, NP  sulfamethoxazole-trimethoprim (BACTRIM DS) 800-160 MG per tablet Take 1 tablet by mouth 2 (two) times daily. 01/13/14   [name], MD     Objective:   Filed Vitals:   01/13/14 1124  BP: 114/74  Pulse: 87  Temp: 98.1 F (36.7 C)  TempSrc: Oral  Resp: 16  SpO2: 91%    Exam General  appearance : Awake, alert, not in any distress. Speech Clear. Not toxic looking HEENT: Atraumatic and Normocephalic, pupils equally reactive to light and accomodation Neck: supple, no JVD. No cervical lymphadenopathy.  Chest:Good air entry bilaterally, no added sounds  CVS: S1 S2 regular, no murmurs.  Abdomen: Bowel sounds present, Non tender and not distended with no gaurding, rigidity or rebound. Extremities: B/L Lower Ext shows no edema, both legs are warm to touch Neurology: Awake alert, and oriented X 3, CN II-XII intact, Non focal Skin:No Rash Wounds:N/A  Data Review Lab Results  Component Value Date   HGBA1C 5.1 08/16/2013     Assessment & Plan   1. Paronychia of great toe of left foot   -  sulfamethoxazole-trimethoprim (BACTRIM DS) 800-160 MG per tablet; Take 1 tablet by mouth 2 (two) times daily.  Dispense: 20 tablet; Refill: 0  2. Lung cancer Continue current regimen  No Follow-up on file.  The patient was given clear instructions to go to ER or return to medical center if symptoms don't improve, worsen or new problems develop. The patient verbalized understanding. The patient was told to call to get lab results if they haven't heard anything in the next week.   This note has been created with Surveyor, quantity. Any transcriptional errors are unintentional.    [name], MD, [place], [place], [place] [facility], [place]   01/13/2014, 12:04 PM

## 2014-01-13 NOTE — Progress Notes (Signed)
Pt is here with an ingrown toenail in his left great toe starting 4 days ago.

## 2014-01-15 LAB — WOUND CULTURE
GRAM STAIN: NONE SEEN
GRAM STAIN: NONE SEEN
Gram Stain: NONE SEEN

## 2014-01-17 ENCOUNTER — Telehealth: Payer: Self-pay | Admitting: Emergency Medicine

## 2014-01-17 NOTE — Telephone Encounter (Signed)
Pt given culture results

## 2014-01-21 ENCOUNTER — Other Ambulatory Visit (HOSPITAL_BASED_OUTPATIENT_CLINIC_OR_DEPARTMENT_OTHER): Payer: No Typology Code available for payment source

## 2014-01-21 ENCOUNTER — Encounter: Payer: Self-pay | Admitting: Physician Assistant

## 2014-01-21 ENCOUNTER — Ambulatory Visit (HOSPITAL_BASED_OUTPATIENT_CLINIC_OR_DEPARTMENT_OTHER): Payer: No Typology Code available for payment source | Admitting: Physician Assistant

## 2014-01-21 ENCOUNTER — Other Ambulatory Visit: Payer: Self-pay | Admitting: Medical Oncology

## 2014-01-21 VITALS — BP 128/81 | HR 73 | Temp 98.4°F | Resp 18 | Ht 66.0 in | Wt 165.3 lb

## 2014-01-21 DIAGNOSIS — C801 Malignant (primary) neoplasm, unspecified: Secondary | ICD-10-CM

## 2014-01-21 DIAGNOSIS — C3410 Malignant neoplasm of upper lobe, unspecified bronchus or lung: Secondary | ICD-10-CM

## 2014-01-21 DIAGNOSIS — R059 Cough, unspecified: Secondary | ICD-10-CM

## 2014-01-21 DIAGNOSIS — C3490 Malignant neoplasm of unspecified part of unspecified bronchus or lung: Secondary | ICD-10-CM

## 2014-01-21 DIAGNOSIS — R05 Cough: Secondary | ICD-10-CM

## 2014-01-21 DIAGNOSIS — R21 Rash and other nonspecific skin eruption: Secondary | ICD-10-CM

## 2014-01-21 LAB — COMPREHENSIVE METABOLIC PANEL (CC13)
ALBUMIN: 3.9 g/dL (ref 3.5–5.0)
ALT: 33 U/L (ref 0–55)
ANION GAP: 11 meq/L (ref 3–11)
AST: 22 U/L (ref 5–34)
Alkaline Phosphatase: 135 U/L (ref 40–150)
BUN: 12 mg/dL (ref 7.0–26.0)
CO2: 23 meq/L (ref 22–29)
Calcium: 9.6 mg/dL (ref 8.4–10.4)
Chloride: 107 mEq/L (ref 98–109)
Creatinine: 0.9 mg/dL (ref 0.7–1.3)
Glucose: 78 mg/dl (ref 70–140)
POTASSIUM: 3.7 meq/L (ref 3.5–5.1)
SODIUM: 140 meq/L (ref 136–145)
TOTAL PROTEIN: 6.8 g/dL (ref 6.4–8.3)
Total Bilirubin: 0.9 mg/dL (ref 0.20–1.20)

## 2014-01-21 MED ORDER — BENZONATATE 100 MG PO CAPS: ORAL_CAPSULE | ORAL | Status: DC

## 2014-01-21 NOTE — Progress Notes (Signed)
Hematology and Oncology Follow Up Visit  Simone Tuckey [id number] Jan 04, 1971 43 y.o. 01/21/2014 2:39 PM [name], [name], MDNo ref. provider found   Principle Diagnosis: 43 year old gentleman with stage IV adenocarcinoma of the lung diagnosed in March of 2015. He presented with left upper lobe lung mass measuring 5.4 x 4.7 x 3.7 cm and bilateral pulmonary nodules. Pathology indicating adenocarcinoma of a lung primary. 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   Current therapy: Tarceva 150 mg daily started in March of 2015. Placed on hold 12/14/13 due to rash. This was resumed again on 12/24/2013. Status post 1 month of therapy  Interim History:  Mr. Mcglory presents today for follow-up visit accompanied by his wife. He states he completed his steroid taper yesterday and took his antibiotics as prescribed. He reports occasional episodes of diarrhea but nothing persistent or troublesome. His skin rash has calmed down considerably. He does continue to have a nonproductive cough. Overall he states his breathing is better and he is not having to use his inhaler as frequently. He also states he no longer has "air hunger". He reports occasional subjective chills but no documented fever. His family members have suggested some things that may help him with his treatment including an Herbalife shake and cell activator as well as recommendations to drink alcohol and water. I've asked him to hold off on all of these options until we can investigate them further to see if they contain anything that would be counterproductive to his Tarceva therapy.  He is still using moisturizer and Hydrocortisone routinely.   He denies any difficulty swallowing or dehydration. He continues to notice some clinical improvement in the last 2 weeks. He is sleeping better and his cough is improved. His performance status continues to slowly improve.  Is not reporting any dyspepsia or  hematochezia.  Medications: I have reviewed the patient's current medications.  Current Outpatient Prescriptions  Medication Sig Dispense Refill  . erlotinib (TARCEVA) 150 MG tablet Take 1 tablet (150 mg total) by mouth daily. Take on an empty stomach 1 hour before meals or 2 hours after.  30 tablet  0  . loratadine (CLARITIN) 10 MG tablet Take 1 tablet (10 mg total) by mouth daily.  30 tablet  11  . sulfamethoxazole-trimethoprim (BACTRIM DS) 800-160 MG per tablet Take 1 tablet by mouth 2 (two) times daily.  20 tablet  0  . acetaminophen (TYLENOL) 325 MG tablet Take 650 mg by mouth every 6 (six) hours as needed.      Marland Kitchen albuterol (PROVENTIL HFA;VENTOLIN HFA) 108 (90 BASE) MCG/ACT inhaler Inhale 2 puffs into the lungs every 6 (six) hours as needed for wheezing or shortness of breath.  1 Inhaler  0  . benzonatate (TESSALON) 100 MG capsule Take 1 to 2 capsules by mouth every 8 hours as needed for cough  45 capsule  1  . chlorpheniramine-HYDROcodone (TUSSIONEX PENNKINETIC ER) 10-8 MG/5ML LQCR Take 5 mLs by mouth every 12 (twelve) hours as needed for cough.  1 Bottle  0  . doxycycline (VIBRAMYCIN) 100 MG capsule Take 1 capsule (100 mg total) by mouth 2 (two) times daily.  28 capsule  0  . fluticasone (FLONASE) 50 MCG/ACT nasal spray Place 2 sprays into both nostrils daily.  16 g  6  . oxyCODONE (OXY IR/ROXICODONE) 5 MG immediate release tablet Take 1-2 tablets (5-10 mg total) by mouth every 6 (six) hours as needed for severe pain.  30 tablet  0  .  predniSONE (DELTASONE) 20 MG tablet Take 1 tablet (20 mg total) by mouth daily with breakfast. Take 40 mg (2 tabs) daily for 1 week, then 30 mg (1.5 tabs) daily for 1 week, then 20 mg (1 tab) daily for 1 week), then 10 mg (1/2 tab) daily for 1 week, then 5 mg daily for 1 week, then stop.  120 tablet  0   No current facility-administered medications for this visit.     Allergies:  Allergies  Allergen Reactions  . Amoxicillin-Pot Clavulanate   .  Penicillins     REACTION: RASH    Past Medical History, Surgical history, Social history, and Family History were reviewed and updated.  Review of Systems: Constitutional:  Negative for fever, chills, night sweats, anorexia, weight loss, pain. Cardiovascular: no chest pain or dyspnea on exertion Respiratory: no cough, shortness of breath, or wheezing Neurological: no TIA or stroke symptoms Dermatological: Rash has resolved.  ENT: negative Skin: Negative. Gastrointestinal: no abdominal pain, change in bowel habits, or black or bloody stools Genito-Urinary: no dysuria, trouble voiding, or hematuria Hematological and Lymphatic: negative for - fatigue, pallor or weight loss Breast: negative Musculoskeletal: negative for - joint pain or muscular weakness Remaining ROS negative.  Physical Exam: Blood pressure 128/81, pulse 73, temperature 98.4 F (36.9 C), temperature source Oral, resp. rate 18, height _0  (1.676 m), weight 165 lb 4.8 oz (74.98 kg), SpO2 100.00%. ECOG: 0 General appearance: alert and cooperative with intermittent dry cough Head: Normocephalic, without obvious abnormality, atraumatic Neck: no adenopathy, no carotid bruit, no JVD, supple, symmetrical, trachea midline and thyroid not enlarged, symmetric, no tenderness/mass/nodules Lymph nodes: Cervical, supraclavicular, and axillary nodes normal. Heart:regular rate and rhythm, S1, S2 normal, no murmur, click, rub or gallop Lung:bilateral basilar rales heard. No wheezing or stridor.  Abdomen: soft, non-tender, without masses or organomegaly EXT:no erythema, induration, or nodules Skin: Very faint acneiform rash on the face and chest, grade 1   Lab Results: Lab Results  Component Value Date   WBC 5.5 01/05/2014   HGB 16.1 01/05/2014   HCT 47.8 01/05/2014   MCV 88.1 01/05/2014   PLT 236 01/05/2014     Chemistry      Component Value Date/Time   NA 140 01/21/2014 0922   NA 141 11/18/2013 0457   K 3.7 01/21/2014 0922   K  3.4* 11/18/2013 0457   CL 104 11/18/2013 0457   CO2 23 01/21/2014 0922   CO2 22 11/18/2013 0457   BUN 12.0 01/21/2014 0922   BUN 15 11/18/2013 0457   CREATININE 0.9 01/21/2014 0922   CREATININE 0.82 11/18/2013 0457   CREATININE 0.69 08/16/2013 1727      Component Value Date/Time   CALCIUM 9.6 01/21/2014 0922   CALCIUM 9.1 11/18/2013 0457   ALKPHOS 135 01/21/2014 0922   ALKPHOS 124* 11/16/2013 0630   AST 22 01/21/2014 0922   AST 18 11/16/2013 0630   ALT 33 01/21/2014 0922   ALT 17 11/16/2013 0630   BILITOT 0.90 01/21/2014 0922   BILITOT 0.6 11/16/2013 0630       Impression and Plan:   43 year old gentleman with the following issues:  1. Stage IV adenocarcinoma of the lung with a left lung mass and bilateral pulmonary nodules. His tumor is EGFR mutated but the ALK mutation was not detected. After 4 weeks of Tarceva, his rash has remained stable. Patient will continue on his Tarceva at 150 mg by mouth daily. I would plan to do a restaging CT scan in the  first week of June with close followup with Dr. Alen Blew a few days later to discuss the results.   2. Cough and shortness of breath: This is related to #1 seems to be improving at this time. I have refilled his Tessalon Perles, 100 mg capsule with directions to take 1 or 2 capsules by mouth every 8 hours as needed for cough. Prescription for 45 capsules with a refill was sent to his pharmacy of record via E. scribed  3.  Acneiform rash: Significantly improved. Continue slow taper of Prednisone. Rash is stable the patient is completed his prednisone taper. We'll continue to monitor his rash development closely   4. Follow up:  02/11/2014 for a toxicity check as well as to discuss the results of the restaging CT scan.  Carlton Adam PA-C  5/15/20152:39 PM

## 2014-01-21 NOTE — Telephone Encounter (Signed)
escribed rx 

## 2014-02-09 ENCOUNTER — Other Ambulatory Visit (HOSPITAL_BASED_OUTPATIENT_CLINIC_OR_DEPARTMENT_OTHER): Payer: No Typology Code available for payment source

## 2014-02-09 ENCOUNTER — Ambulatory Visit (HOSPITAL_COMMUNITY)
Admission: RE | Admit: 2014-02-09 | Discharge: 2014-02-09 | Disposition: A | Discharge status: Home / Self Care | Payer: MEDICAID | Source: Ambulatory Visit | Attending: Oncology | Admitting: Oncology

## 2014-02-09 DIAGNOSIS — R079 Chest pain, unspecified: Secondary | ICD-10-CM | POA: Insufficient documentation

## 2014-02-09 DIAGNOSIS — C3410 Malignant neoplasm of upper lobe, unspecified bronchus or lung: Secondary | ICD-10-CM

## 2014-02-09 DIAGNOSIS — Z9221 Personal history of antineoplastic chemotherapy: Secondary | ICD-10-CM | POA: Insufficient documentation

## 2014-02-09 DIAGNOSIS — M549 Dorsalgia, unspecified: Secondary | ICD-10-CM | POA: Insufficient documentation

## 2014-02-09 DIAGNOSIS — M899 Disorder of bone, unspecified: Secondary | ICD-10-CM | POA: Insufficient documentation

## 2014-02-09 DIAGNOSIS — R209 Unspecified disturbances of skin sensation: Secondary | ICD-10-CM | POA: Insufficient documentation

## 2014-02-09 DIAGNOSIS — R911 Solitary pulmonary nodule: Secondary | ICD-10-CM | POA: Insufficient documentation

## 2014-02-09 DIAGNOSIS — R059 Cough, unspecified: Secondary | ICD-10-CM | POA: Insufficient documentation

## 2014-02-09 DIAGNOSIS — C801 Malignant (primary) neoplasm, unspecified: Secondary | ICD-10-CM

## 2014-02-09 DIAGNOSIS — R109 Unspecified abdominal pain: Secondary | ICD-10-CM | POA: Insufficient documentation

## 2014-02-09 DIAGNOSIS — C3490 Malignant neoplasm of unspecified part of unspecified bronchus or lung: Secondary | ICD-10-CM | POA: Insufficient documentation

## 2014-02-09 DIAGNOSIS — M949 Disorder of cartilage, unspecified: Secondary | ICD-10-CM

## 2014-02-09 DIAGNOSIS — R222 Localized swelling, mass and lump, trunk: Secondary | ICD-10-CM | POA: Insufficient documentation

## 2014-02-09 DIAGNOSIS — N200 Calculus of kidney: Secondary | ICD-10-CM | POA: Insufficient documentation

## 2014-02-09 DIAGNOSIS — R05 Cough: Secondary | ICD-10-CM | POA: Insufficient documentation

## 2014-02-09 LAB — COMPREHENSIVE METABOLIC PANEL (CC13)
ALK PHOS: 138 U/L (ref 40–150)
ALT: 23 U/L (ref 0–55)
AST: 18 U/L (ref 5–34)
Albumin: 3.9 g/dL (ref 3.5–5.0)
Anion Gap: 14 mEq/L — ABNORMAL HIGH (ref 3–11)
BILIRUBIN TOTAL: 1.43 mg/dL — AB (ref 0.20–1.20)
BUN: 8.4 mg/dL (ref 7.0–26.0)
CO2: 20 mEq/L — ABNORMAL LOW (ref 22–29)
Calcium: 9.2 mg/dL (ref 8.4–10.4)
Chloride: 108 mEq/L (ref 98–109)
Creatinine: 0.8 mg/dL (ref 0.7–1.3)
GLUCOSE: 97 mg/dL (ref 70–140)
POTASSIUM: 3.6 meq/L (ref 3.5–5.1)
SODIUM: 143 meq/L (ref 136–145)
Total Protein: 7.1 g/dL (ref 6.4–8.3)

## 2014-02-09 LAB — CBC WITH DIFFERENTIAL/PLATELET
BASO%: 0.5 % (ref 0.0–2.0)
Basophils Absolute: 0 10*3/uL (ref 0.0–0.1)
EOS%: 3.7 % (ref 0.0–7.0)
Eosinophils Absolute: 0.2 10*3/uL (ref 0.0–0.5)
HCT: 47.9 % (ref 38.4–49.9)
HGB: 16.4 g/dL (ref 13.0–17.1)
LYMPH%: 17.3 % (ref 14.0–49.0)
MCH: 29.6 pg (ref 27.2–33.4)
MCHC: 34.3 g/dL (ref 32.0–36.0)
MCV: 86.3 fL (ref 79.3–98.0)
MONO#: 0.4 10*3/uL (ref 0.1–0.9)
MONO%: 6.6 % (ref 0.0–14.0)
NEUT%: 71.9 % (ref 39.0–75.0)
NEUTROS ABS: 4.8 10*3/uL (ref 1.5–6.5)
Platelets: 297 10*3/uL (ref 140–400)
RBC: 5.55 10*6/uL (ref 4.20–5.82)
RDW: 13.2 % (ref 11.0–14.6)
WBC: 6.7 10*3/uL (ref 4.0–10.3)
lymph#: 1.2 10*3/uL (ref 0.9–3.3)

## 2014-02-09 MED ORDER — IOHEXOL 300 MG/ML  SOLN
100.0000 mL | Freq: Once | INTRAMUSCULAR | Status: AC | PRN
  Administered 2014-02-09: 100 mL via INTRAVENOUS

## 2014-02-11 ENCOUNTER — Ambulatory Visit (HOSPITAL_BASED_OUTPATIENT_CLINIC_OR_DEPARTMENT_OTHER): Payer: No Typology Code available for payment source | Admitting: Oncology

## 2014-02-11 ENCOUNTER — Encounter: Payer: Self-pay | Admitting: Oncology

## 2014-02-11 ENCOUNTER — Telehealth: Payer: Self-pay | Admitting: Oncology

## 2014-02-11 VITALS — BP 132/76 | HR 70 | Temp 98.1°F | Resp 18 | Ht 66.0 in | Wt 164.8 lb

## 2014-02-11 DIAGNOSIS — C3490 Malignant neoplasm of unspecified part of unspecified bronchus or lung: Secondary | ICD-10-CM

## 2014-02-11 DIAGNOSIS — R21 Rash and other nonspecific skin eruption: Secondary | ICD-10-CM

## 2014-02-11 DIAGNOSIS — C3410 Malignant neoplasm of upper lobe, unspecified bronchus or lung: Secondary | ICD-10-CM

## 2014-02-11 MED ORDER — DOXYCYCLINE HYCLATE 100 MG PO CAPS: 100.0000 mg | ORAL_CAPSULE | Freq: Two times a day (BID) | ORAL | Status: DC

## 2014-02-11 MED ORDER — PREDNISONE 20 MG PO TABS: ORAL_TABLET | ORAL | Status: DC

## 2014-02-11 NOTE — Telephone Encounter (Signed)
gv adn rpitned appt sched and avs for pt for July

## 2014-02-11 NOTE — Progress Notes (Signed)
Hematology and Oncology Follow Up Visit  Chad George [id number] 1971-04-25 42 y.o. 02/11/2014 9:19 AM [name], [name], MDNo ref. provider found   Principle Diagnosis: 43 year old gentleman with stage IV adenocarcinoma of the lung diagnosed in March of 2015. He presented with left upper lobe lung mass measuring 5.4 x 4.7 x 3.7 cm and bilateral pulmonary nodules. Pathology indicating adenocarcinoma of a lung primary. 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   Current therapy: Tarceva 150 mg daily started in March of 2015. Placed on hold 12/14/13 due to rash. This was resumed again on 12/24/2013.   Interim History:  Chad George presents today for follow-up visit. Since the last visit, he reports a flaring up of his acneiform rash on the face and the chest. He is currently not taking prednisone or doxycycline at this time. He is still using moisturizer and Hydrocortisone routinely. The rash is rather severe and is predominantly in the face in the chest area. Slightly on the back but no involvement anywhere else. He has not reported any fevers or chills or sweats. He has not reported any difficulty breathing or stridor. He has not reported any difficulty swallowing or dehydration. He is sleeping better and his cough is improved. But his sleep recently got interrupted with worsening of his rash. His performance status is slowly improving at this time. He is not reporting any headaches or blurry vision does not report any alteration of mental status psychiatric issues of depression. Is not reporting any chest pain or palpitation. Does not report any nausea or vomiting abdominal pain. Has not reported any hematochezia or melena. Has not reported any frequency urgency or hesitancy. Does not report any lymphadenopathy or petechiae. He does not report any skeletal pain. Rest of his review of system is unremarkable.  Medications: I have reviewed the patient's current medications.   Current Outpatient Prescriptions  Medication Sig Dispense Refill  . acetaminophen (TYLENOL) 325 MG tablet Take 650 mg by mouth every 6 (six) hours as needed.      Marland Kitchen albuterol (PROVENTIL HFA;VENTOLIN HFA) 108 (90 BASE) MCG/ACT inhaler Inhale 2 puffs into the lungs every 6 (six) hours as needed for wheezing or shortness of breath.  1 Inhaler  0  . benzonatate (TESSALON) 100 MG capsule Take 1 to 2 capsules by mouth every 8 hours as needed for cough  45 capsule  1  . chlorpheniramine-HYDROcodone (TUSSIONEX PENNKINETIC ER) 10-8 MG/5ML LQCR Take 5 mLs by mouth every 12 (twelve) hours as needed for cough.  1 Bottle  0  . doxycycline (VIBRAMYCIN) 100 MG capsule Take 1 capsule (100 mg total) by mouth 2 (two) times daily.  60 capsule  1  . erlotinib (TARCEVA) 150 MG tablet Take 1 tablet (150 mg total) by mouth daily. Take on an empty stomach 1 hour before meals or 2 hours after.  30 tablet  0  . fluticasone (FLONASE) 50 MCG/ACT nasal spray Place 2 sprays into both nostrils daily.  16 g  6  . loratadine (CLARITIN) 10 MG tablet Take 1 tablet (10 mg total) by mouth daily.  30 tablet  11  . oxyCODONE (OXY IR/ROXICODONE) 5 MG immediate release tablet Take 1-2 tablets (5-10 mg total) by mouth every 6 (six) hours as needed for severe pain.  30 tablet  0  . predniSONE (DELTASONE) 20 MG tablet Take three tablets daily.  90 tablet  1  . sulfamethoxazole-trimethoprim (BACTRIM DS) 800-160 MG per tablet Take 1 tablet by mouth  2 (two) times daily.  20 tablet  0   No current facility-administered medications for this visit.     Allergies:  Allergies  Allergen Reactions  . Amoxicillin-Pot Clavulanate   . Penicillins     REACTION: RASH    Past Medical History, Surgical history, Social history, and Family History were reviewed and updated.    Physical Exam: Blood pressure 132/76, pulse 70, temperature 98.1 F (36.7 C), temperature source Oral, resp. rate 18, height $RemoveBe'5\' 6"'nBAsIBtPv$  (1.676 m), weight 164 lb 12.8 oz  (74.753 kg). ECOG: 0 General appearance: alert and cooperative not in any distress. Head: Normocephalic, no masses or lesions. Neck: no adenopathy, no thyroid masses. Lymph nodes: Cervical, supraclavicular, and axillary nodes normal. Heart:regular rate and rhythm, S1, S2 normal, no murmur, click, rub or gallop Lung:bilateral basilar rales heard. No wheezing or stridor.  Abdomen: soft, non-tender, without masses or organomegaly EXT:no erythema, induration, or nodules Skin: Diffuse acneiform rash on the face chest and back.  Lab Results: Lab Results  Component Value Date   WBC 6.7 02/09/2014   HGB 16.4 02/09/2014   HCT 47.9 02/09/2014   MCV 86.3 02/09/2014   PLT 297 02/09/2014     Chemistry      Component Value Date/Time   NA 143 02/09/2014 1251   NA 141 11/18/2013 0457   K 3.6 02/09/2014 1251   K 3.4* 11/18/2013 0457   CL 104 11/18/2013 0457   CO2 20* 02/09/2014 1251   CO2 22 11/18/2013 0457   BUN 8.4 02/09/2014 1251   BUN 15 11/18/2013 0457   CREATININE 0.8 02/09/2014 1251   CREATININE 0.82 11/18/2013 0457   CREATININE 0.69 08/16/2013 1727      Component Value Date/Time   CALCIUM 9.2 02/09/2014 1251   CALCIUM 9.1 11/18/2013 0457   ALKPHOS 138 02/09/2014 1251   ALKPHOS 124* 11/16/2013 0630   AST 18 02/09/2014 1251   AST 18 11/16/2013 0630   ALT 23 02/09/2014 1251   ALT 17 11/16/2013 0630   BILITOT 1.43* 02/09/2014 1251   BILITOT 0.6 11/16/2013 0630     CLINICAL DATA: History of lung cancer diagnosed in March 2015.  Chemotherapy ongoing. Chest pain. Cough. Left-sided flank pain. Left  upper back pain and burning sensation.  EXAM:  CT ABDOMEN AND PELVIS WITH CONTRAST  TECHNIQUE:  Multidetector CT imaging of the abdomen and pelvis was performed  using the standard protocol following bolus administration of  intravenous contrast.  CONTRAST: 158mL OMNIPAQUE IOHEXOL 300 MG/ML SOLN  COMPARISON: CT of abdomen and pelvis 11/20/2013. CT of the chest  11/15/2013.  FINDINGS:  CT CHEST FINDINGS  Mediastinum:  Heart size is borderline enlarged. There is no  significant pericardial fluid, thickening or pericardial  calcification. There is some soft tissue thickening in the left  hilar region without well-defined lymphadenopathy, likely to  represent treated nodal disease. No other definite pathologically  enlarged mediastinal or right hilar lymph nodes are noted, although  there is a cluster of numerous borderline enlarged prevascular lymph  nodes which appear smaller than the prior examination, largest of  which measures up to 8 mm. Esophagus is unremarkable in appearance.  Lungs/Pleura: Compared to the prior examination, the primary left  upper lobe mass has significantly decreased in size, currently  measuring approximately 3.2 x 3.2 cm (image 25 of series 5). This  mass continues to abut the left major fissure and is associated with  retraction of the overlying pleura. This mass extends toward the  left hilum where  there is some peribronchovascular soft tissue  thickening which likely represents treated nodal metastases. The  innumerable previously noted pulmonary nodules scattered throughout  the lungs bilaterally have largely regressed, indicating a positive  response to therapy. A few scattered 2-3 mm nodules continue to be  seen, however, the regression compared to the prior examination is  otherwise quite impressive. No acute consolidative airspace disease.  No pleural effusions.  Musculoskeletal: Today's study demonstrates numerous sclerotic  lesions throughout the visualized axial and appendicular skeleton  which are new compared to the prior study, most compatible with  healing treated metastases. The largest single lesion is in the T10  vertebral body on the right side measuring 2 cm in diameter.  CT ABDOMEN AND PELVIS FINDINGS  Abdomen/Pelvis: Previously noted hypovascular lesion in the anterior  aspect of the spleen appear smaller than the prior study, currently  measuring only  12 mm, presumably a treated metastasis. Sub cm  low-attenuation lesion in segment 4A of the liver is unchanged and  nonspecific. There is a new sub cm low-attenuation lesion in segment  2 of the liver which is also nonspecific, but suspicious for  potential metastatic lesion given the lack of the lesion in this  area on the prior examination. The appearance of the gallbladder,  pancreas, bilateral adrenal glands and the right kidney is  unremarkable. In the interpolar region of the left kidney there is a  sub cm low-attenuation lesion which is too small to characterize,  but similar to the prior study, favored to represent a tiny cyst. 4  mm nonobstructive calculus in the interpolar collecting system of  the left kidney also noted.  No significant volume of ascites. No pneumoperitoneum. No pathologic  distention of small bowel. No lymphadenopathy identified within the  abdomen or pelvis. Prostate gland and urinary bladder are  unremarkable in appearance.  Musculoskeletal: Multiple predominantly sclerotic lesions are again  noted throughout the visualized axial and appendicular skeleton,  largest of which measures 2.9 cm in the anterior aspect of the L4  vertebral body.  IMPRESSION:  1. Today's study demonstrates a positive response to therapy with  significant regression of the primary left upper lobe mass which  currently measures only 3.2 x 3.2 cm, and near complete resolution  of the majority of the previously noted pulmonary nodules, left  hilar and mediastinal adenopathy, as well as the previously noted  lesion in the anterior aspect of the spleen. However, today's study  does demonstrate numerous sclerotic lesions throughout the  visualized axial and appendicular skeleton which are new compared to  the prior examination, which presumably represent widespread treated  skeletal metastatic disease.  2. There is a new sub cm low-attenuation lesion in segment 2 of the  liver which is  highly nonspecific and warrants continued attention  on future followup studies, as this may be a small metastatic  lesion.  3. 4 mm nonobstructive calculus in the interpolar collecting system  of left kidney.  4. Additional incidental findings, as above.    Impression and Plan:   43 year old gentleman with the following issues:  1. Stage IV adenocarcinoma of the lung with a left lung mass and bilateral pulmonary nodules. His tumor is EGFR mutated but the ALK mutation was not detected. CT scan on 02/09/2014 was discussed today with the patient and showed a clear response with tumor regression on Tarceva. I plan on continuing this medication for the time being with occasional interruptions due to his actiniform rash. He is not  reporting any other complications related to Tarceva.  2. Cough and shortness of breath: This is related to #1 seems to be improving at this time.   3.  Acneiform rash: Appears to have gotten worse again. I will restart his doxycycline which she needs to continue indefinitely. I also will start him on 60 mg of prednisone orally. I also instructed him to hold the Tarceva for about a week to improve his rash and resume its at that time. If this becomes an issue, I will consider dose reduction on the Tarceva. He is hesitant to do so and feels that he can deal with a rash more than the cancer. I continue to educate him about the possible complications associated with worsening diffuse systemic involvement of this rash.  4. Follow up:  In one month to assess new complications. If his rash is better in 4 weeks we can start a prednisone slow taper.  Wyatt Portela MD 6/5/20159:19 AM

## 2014-02-14 ENCOUNTER — Encounter: Payer: Self-pay | Admitting: Internal Medicine

## 2014-02-14 ENCOUNTER — Ambulatory Visit: Payer: No Typology Code available for payment source | Attending: Internal Medicine | Admitting: Internal Medicine

## 2014-02-14 VITALS — BP 118/75 | HR 85 | Temp 98.1°F | Resp 16 | Wt 167.2 lb

## 2014-02-14 DIAGNOSIS — Z79899 Other long term (current) drug therapy: Secondary | ICD-10-CM | POA: Insufficient documentation

## 2014-02-14 DIAGNOSIS — Z880 Allergy status to penicillin: Secondary | ICD-10-CM | POA: Insufficient documentation

## 2014-02-14 DIAGNOSIS — C3490 Malignant neoplasm of unspecified part of unspecified bronchus or lung: Secondary | ICD-10-CM

## 2014-02-14 NOTE — Progress Notes (Signed)
Patient here for follow up visit States here for regular check up History of lung cancer

## 2014-02-14 NOTE — Progress Notes (Signed)
Patient ID: Chad George, male   DOB: [birth year], 43 y.o.   MRN: [id number]   Chad George, is a 43 y.o. male  [id number]  [id number]  DOB - 10/15/1970  Chief Complaint  Patient presents with  . Follow-up        Subjective:   Chad George is a 43 y.o. male here today for a follow up visit. Patient was recently diagnosed with stage IV adenocarcinoma of the lumbar currently on chemotherapy. Recent CT chest shows significant improvement in tumor size but are likely metastatic nodules in the liver are widespread bone metastasis. Patient is staying positive, has no complaint today, says cough is better, as well as pain. Denies shortness of breath. His medications are listed below Patient has No headache, No chest pain, No abdominal pain - No Nausea, No new weakness tingling or numbness.  No problems updated.  ALLERGIES: Allergies  Allergen Reactions  . Amoxicillin-Pot Clavulanate   . Penicillins     REACTION: RASH    PAST MEDICAL HISTORY: Past Medical History  Diagnosis Date  . High cholesterol   . Pneumonia   . Adenocarcinoma 11/21/2013    MEDICATIONS AT HOME: Prior to Admission medications   Medication Sig Start Date End Date Taking? Authorizing Provider  acetaminophen (TYLENOL) 325 MG tablet Take 650 mg by mouth every 6 (six) hours as needed.    Historical Provider, MD  albuterol (PROVENTIL HFA;VENTOLIN HFA) 108 (90 BASE) MCG/ACT inhaler Inhale 2 puffs into the lungs every 6 (six) hours as needed for wheezing or shortness of breath. 10/16/13   Reyne Dumas, MD  benzonatate (TESSALON) 100 MG capsule Take 1 to 2 capsules by mouth every 8 hours as needed for cough 01/21/14   Adrena E Johnson, PA-C  chlorpheniramine-HYDROcodone (TUSSIONEX PENNKINETIC ER) 10-8 MG/5ML LQCR Take 5 mLs by mouth every 12 (twelve) hours as needed for cough. 11/21/13   Kelvin Cellar, MD  doxycycline (VIBRAMYCIN) 100 MG capsule Take 1 capsule (100 mg total) by mouth 2 (two) times daily. 02/11/14    Wyatt Portela, MD  erlotinib (TARCEVA) 150 MG tablet Take 1 tablet (150 mg total) by mouth daily. Take on an empty stomach 1 hour before meals or 2 hours after. 12/03/13   Wyatt Portela, MD  fluticasone (FLONASE) 50 MCG/ACT nasal spray Place 2 sprays into both nostrils daily. 10/16/13   Reyne Dumas, MD  loratadine (CLARITIN) 10 MG tablet Take 1 tablet (10 mg total) by mouth daily. 10/16/13   Reyne Dumas, MD  oxyCODONE (OXY IR/ROXICODONE) 5 MG immediate release tablet Take 1-2 tablets (5-10 mg total) by mouth every 6 (six) hours as needed for severe pain. 12/24/13   Maryanna Shape, NP  predniSONE (DELTASONE) 20 MG tablet Take three tablets daily. 02/11/14   Wyatt Portela, MD  sulfamethoxazole-trimethoprim (BACTRIM DS) 800-160 MG per tablet Take 1 tablet by mouth 2 (two) times daily. 01/13/14   [name], MD     Objective:   Filed Vitals:   02/14/14 1711  BP: 118/75  Pulse: 85  Temp: 98.1 F (36.7 C)  Resp: 16  Weight: 167 lb 3.2 oz (75.841 kg)  SpO2: 100%    Exam General appearance : Awake, alert, not in any distress. Speech Clear. Not toxic looking HEENT: Acneiform rash, extensive on the face, Atraumatic and Normocephalic, pupils equally reactive to light and accomodation Neck: supple, no JVD. No cervical lymphadenopathy.  Chest: Occasional cough, Good air entry bilaterally, no added sounds  CVS: S1 S2 regular, no murmurs.  Abdomen: Bowel sounds present, Non tender and not distended with no gaurding, rigidity or rebound. Extremities: B/L Lower Ext shows no edema, both legs are warm to touch Neurology: Awake alert, and oriented X 3, CN II-XII intact, Non focal Skin:No Rash Wounds:N/A  Data Review Lab Results  Component Value Date   HGBA1C 5.1 08/16/2013     Assessment & Plan   1. Lung cancer Continue chemotherapy Follow up with pulmonologist and oncologist as scheduled Patient was counseled extensively and encouraged   Return in about 3 months (around 05/17/2014), or  if symptoms worsen or fail to improve, for Lung Cancer.  The patient was given clear instructions to go to ER or return to medical center if symptoms don't improve, worsen or new problems develop. The patient verbalized understanding. The patient was told to call to get lab results if they haven't heard anything in the next week.   This note has been created with Surveyor, quantity. Any transcriptional errors are unintentional.    [name], MD, [place], [place], [place] and [facility] [place], [place]   02/14/2014, 5:38 PM

## 2014-03-10 ENCOUNTER — Ambulatory Visit (HOSPITAL_BASED_OUTPATIENT_CLINIC_OR_DEPARTMENT_OTHER): Payer: No Typology Code available for payment source | Admitting: Physician Assistant

## 2014-03-10 ENCOUNTER — Encounter: Payer: Self-pay | Admitting: Physician Assistant

## 2014-03-10 ENCOUNTER — Other Ambulatory Visit (HOSPITAL_BASED_OUTPATIENT_CLINIC_OR_DEPARTMENT_OTHER): Payer: No Typology Code available for payment source

## 2014-03-10 ENCOUNTER — Telehealth: Payer: Self-pay | Admitting: Physician Assistant

## 2014-03-10 VITALS — BP 126/80 | HR 67 | Temp 98.4°F | Resp 20 | Ht 66.0 in | Wt 163.9 lb

## 2014-03-10 DIAGNOSIS — R059 Cough, unspecified: Secondary | ICD-10-CM

## 2014-03-10 DIAGNOSIS — C3410 Malignant neoplasm of upper lobe, unspecified bronchus or lung: Secondary | ICD-10-CM

## 2014-03-10 DIAGNOSIS — L251 Unspecified contact dermatitis due to drugs in contact with skin: Secondary | ICD-10-CM

## 2014-03-10 DIAGNOSIS — R0602 Shortness of breath: Secondary | ICD-10-CM

## 2014-03-10 DIAGNOSIS — R05 Cough: Secondary | ICD-10-CM

## 2014-03-10 DIAGNOSIS — C3490 Malignant neoplasm of unspecified part of unspecified bronchus or lung: Secondary | ICD-10-CM

## 2014-03-10 LAB — COMPREHENSIVE METABOLIC PANEL (CC13)
ALT: 39 U/L (ref 0–55)
AST: 16 U/L (ref 5–34)
Albumin: 3.4 g/dL — ABNORMAL LOW (ref 3.5–5.0)
Alkaline Phosphatase: 69 U/L (ref 40–150)
Anion Gap: 9 mEq/L (ref 3–11)
BILIRUBIN TOTAL: 1.82 mg/dL — AB (ref 0.20–1.20)
BUN: 13.8 mg/dL (ref 7.0–26.0)
CO2: 24 mEq/L (ref 22–29)
CREATININE: 0.8 mg/dL (ref 0.7–1.3)
Calcium: 8.7 mg/dL (ref 8.4–10.4)
Chloride: 107 mEq/L (ref 98–109)
Glucose: 106 mg/dl (ref 70–140)
Potassium: 3.5 mEq/L (ref 3.5–5.1)
Sodium: 139 mEq/L (ref 136–145)
Total Protein: 5.8 g/dL — ABNORMAL LOW (ref 6.4–8.3)

## 2014-03-10 LAB — CBC WITH DIFFERENTIAL/PLATELET
BASO%: 0.3 % (ref 0.0–2.0)
BASOS ABS: 0 10*3/uL (ref 0.0–0.1)
EOS ABS: 0 10*3/uL (ref 0.0–0.5)
EOS%: 0.6 % (ref 0.0–7.0)
HEMATOCRIT: 48.7 % (ref 38.4–49.9)
HGB: 16.3 g/dL (ref 13.0–17.1)
LYMPH%: 12.6 % — AB (ref 14.0–49.0)
MCH: 29.8 pg (ref 27.2–33.4)
MCHC: 33.5 g/dL (ref 32.0–36.0)
MCV: 89 fL (ref 79.3–98.0)
MONO#: 0.4 10*3/uL (ref 0.1–0.9)
MONO%: 5.6 % (ref 0.0–14.0)
NEUT#: 6 10*3/uL (ref 1.5–6.5)
NEUT%: 80.9 % — AB (ref 39.0–75.0)
PLATELETS: 183 10*3/uL (ref 140–400)
RBC: 5.48 10*6/uL (ref 4.20–5.82)
RDW: 13.8 % (ref 11.0–14.6)
WBC: 7.5 10*3/uL (ref 4.0–10.3)
lymph#: 0.9 10*3/uL (ref 0.9–3.3)

## 2014-03-10 MED ORDER — BENZONATATE 100 MG PO CAPS: ORAL_CAPSULE | ORAL | Status: DC

## 2014-03-10 NOTE — Telephone Encounter (Signed)
, °

## 2014-03-10 NOTE — Progress Notes (Signed)
Hematology and Oncology Follow Up Visit  Chad George [id number] 1970/10/01 43 y.o. 03/10/2014 2:42 PM [name], Olugbemiga, MD   Principle Diagnosis: 43 year old gentleman with stage IV adenocarcinoma of the lung diagnosed in March of 2015. He presented with left upper lobe lung mass measuring 5.4 x 4.7 x 3.7 cm and bilateral pulmonary nodules. Pathology indicating adenocarcinoma of a lung primary. 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   Current therapy: Tarceva 150 mg daily started in March of 2015. Placed on hold 12/14/13 due to rash. This was resumed again on 12/24/2013. He is status post approximately 2 months of therapy.  Interim History:  Mr. Meals presents today for follow-up visit. He is currently on 60 mg of prednisone daily for a flare of the Tarceva-related rash. The rash is significantly better and he reports that he also had some areas on his legs but these are also better. He notes some dryness and cracking about the nailbeds on his fingers and his toes with him some mild paronychia type inflammation. He has been taking the prednisone 20 mg with each meal and does complain of some difficulty sleeping after taking his dose with dinner. He has not reported any fevers or chills or sweats. He has not reported any difficulty breathing or stridor. He has not reported any difficulty swallowing or dehydration. He is sleeping better and his cough is improved. He requests a refill for his Ladona Ridgel which are very helpful in managing his cough. His performance status has significantly improved and he requests a letter from [place] so that he may return to work. He works as an Clinical biochemist.  He is not reporting any headaches or blurry vision does not report any alteration of mental status psychiatric issues of depression. Is not reporting any chest pain or palpitation. Does not report any nausea or vomiting abdominal pain. Has not reported any  hematochezia or melena. Has not reported any frequency urgency or hesitancy. Does not report any lymphadenopathy or petechiae. He does not report any skeletal pain. Rest of his review of system is unremarkable.  Medications: I have reviewed the patient's current medications.  Current Outpatient Prescriptions  Medication Sig Dispense Refill  . acetaminophen (TYLENOL) 325 MG tablet Take 650 mg by mouth every 6 (six) hours as needed.      . benzonatate (TESSALON) 100 MG capsule Take 1 to 2 capsules by mouth every 8 hours as needed for cough  45 capsule  2  . doxycycline (VIBRAMYCIN) 100 MG capsule Take 1 capsule (100 mg total) by mouth 2 (two) times daily.  60 capsule  1  . erlotinib (TARCEVA) 150 MG tablet Take 1 tablet (150 mg total) by mouth daily. Take on an empty stomach 1 hour before meals or 2 hours after.  30 tablet  0  . fluticasone (FLONASE) 50 MCG/ACT nasal spray Place 2 sprays into both nostrils daily.  16 g  6  . guaiFENesin (MUCINEX) 600 MG 12 hr tablet Take by mouth 2 (two) times daily.      Marland Kitchen loratadine (CLARITIN) 10 MG tablet Take 1 tablet (10 mg total) by mouth daily.  30 tablet  11  . oxyCODONE (OXY IR/ROXICODONE) 5 MG immediate release tablet Take 1-2 tablets (5-10 mg total) by mouth every 6 (six) hours as needed for severe pain.  30 tablet  0  . predniSONE (DELTASONE) 20 MG tablet Take three tablets daily.  90 tablet  1  . albuterol (PROVENTIL  HFA;VENTOLIN HFA) 108 (90 BASE) MCG/ACT inhaler Inhale 2 puffs into the lungs every 6 (six) hours as needed for wheezing or shortness of breath.  1 Inhaler  0  . chlorpheniramine-HYDROcodone (TUSSIONEX PENNKINETIC ER) 10-8 MG/5ML LQCR Take 5 mLs by mouth every 12 (twelve) hours as needed for cough.  1 Bottle  0  . sulfamethoxazole-trimethoprim (BACTRIM DS) 800-160 MG per tablet Take 1 tablet by mouth 2 (two) times daily.  20 tablet  0   No current facility-administered medications for this visit.     Allergies:  Allergies  Allergen  Reactions  . Amoxicillin-Pot Clavulanate   . Penicillins     REACTION: RASH    Past Medical History, Surgical history, Social history, and Family History were reviewed and updated.    Physical Exam: Blood pressure 126/80, pulse 67, temperature 98.4 F (36.9 C), temperature source Oral, resp. rate 20, height $RemoveBe'5\' 6"'YtrwQbATo$  (1.676 m), weight 163 lb 14.4 oz (74.345 kg). ECOG: 0 General appearance: alert and cooperative not in any distress. Head: Normocephalic, no masses or lesions. Neck: no adenopathy, no thyroid masses. Lymph nodes: Cervical, supraclavicular, and axillary nodes normal. Heart:regular rate and rhythm, S1, S2 normal, no murmur, click, rub or gallop Lung:bilateral basilar rales heard. No wheezing or stridor.  Abdomen: soft, non-tender, without masses or organomegaly EXT:no erythema, induration, or nodules Skin: Mild diffuse acneiform rash on the face chest and back.  Lab Results: Lab Results  Component Value Date   WBC 7.5 03/10/2014   HGB 16.3 03/10/2014   HCT 48.7 03/10/2014   MCV 89.0 03/10/2014   PLT 183 03/10/2014     Chemistry      Component Value Date/Time   NA 139 03/10/2014 0917   NA 141 11/18/2013 0457   K 3.5 03/10/2014 0917   K 3.4* 11/18/2013 0457   CL 104 11/18/2013 0457   CO2 24 03/10/2014 0917   CO2 22 11/18/2013 0457   BUN 13.8 03/10/2014 0917   BUN 15 11/18/2013 0457   CREATININE 0.8 03/10/2014 0917   CREATININE 0.82 11/18/2013 0457   CREATININE 0.69 08/16/2013 1727      Component Value Date/Time   CALCIUM 8.7 03/10/2014 0917   CALCIUM 9.1 11/18/2013 0457   ALKPHOS 69 03/10/2014 0917   ALKPHOS 124* 11/16/2013 0630   AST 16 03/10/2014 0917   AST 18 11/16/2013 0630   ALT 39 03/10/2014 0917   ALT 17 11/16/2013 0630   BILITOT 1.82* 03/10/2014 0917   BILITOT 0.6 11/16/2013 0630     CLINICAL DATA: History of lung cancer diagnosed in March 2015.  Chemotherapy ongoing. Chest pain. Cough. Left-sided flank pain. Left  upper back pain and burning sensation.  EXAM:  CT ABDOMEN AND  PELVIS WITH CONTRAST  TECHNIQUE:  Multidetector CT imaging of the abdomen and pelvis was performed  using the standard protocol following bolus administration of  intravenous contrast.  CONTRAST: 15mL OMNIPAQUE IOHEXOL 300 MG/ML SOLN  COMPARISON: CT of abdomen and pelvis 11/20/2013. CT of the chest  11/15/2013.  FINDINGS:  CT CHEST FINDINGS  Mediastinum: Heart size is borderline enlarged. There is no  significant pericardial fluid, thickening or pericardial  calcification. There is some soft tissue thickening in the left  hilar region without well-defined lymphadenopathy, likely to  represent treated nodal disease. No other definite pathologically  enlarged mediastinal or right hilar lymph nodes are noted, although  there is a cluster of numerous borderline enlarged prevascular lymph  nodes which appear smaller than the prior examination, largest of  which measures up to 8 mm. Esophagus is unremarkable in appearance.  Lungs/Pleura: Compared to the prior examination, the primary left  upper lobe mass has significantly decreased in size, currently  measuring approximately 3.2 x 3.2 cm (image 25 of series 5). This  mass continues to abut the left major fissure and is associated with  retraction of the overlying pleura. This mass extends toward the  left hilum where there is some peribronchovascular soft tissue  thickening which likely represents treated nodal metastases. The  innumerable previously noted pulmonary nodules scattered throughout  the lungs bilaterally have largely regressed, indicating a positive  response to therapy. A few scattered 2-3 mm nodules continue to be  seen, however, the regression compared to the prior examination is  otherwise quite impressive. No acute consolidative airspace disease.  No pleural effusions.  Musculoskeletal: Today's study demonstrates numerous sclerotic  lesions throughout the visualized axial and appendicular skeleton  which are new  compared to the prior study, most compatible with  healing treated metastases. The largest single lesion is in the T10  vertebral body on the right side measuring 2 cm in diameter.  CT ABDOMEN AND PELVIS FINDINGS  Abdomen/Pelvis: Previously noted hypovascular lesion in the anterior  aspect of the spleen appear smaller than the prior study, currently  measuring only 12 mm, presumably a treated metastasis. Sub cm  low-attenuation lesion in segment 4A of the liver is unchanged and  nonspecific. There is a new sub cm low-attenuation lesion in segment  2 of the liver which is also nonspecific, but suspicious for  potential metastatic lesion given the lack of the lesion in this  area on the prior examination. The appearance of the gallbladder,  pancreas, bilateral adrenal glands and the right kidney is  unremarkable. In the interpolar region of the left kidney there is a  sub cm low-attenuation lesion which is too small to characterize,  but similar to the prior study, favored to represent a tiny cyst. 4  mm nonobstructive calculus in the interpolar collecting system of  the left kidney also noted.  No significant volume of ascites. No pneumoperitoneum. No pathologic  distention of small bowel. No lymphadenopathy identified within the  abdomen or pelvis. Prostate gland and urinary bladder are  unremarkable in appearance.  Musculoskeletal: Multiple predominantly sclerotic lesions are again  noted throughout the visualized axial and appendicular skeleton,  largest of which measures 2.9 cm in the anterior aspect of the L4  vertebral body.  IMPRESSION:  1. Today's study demonstrates a positive response to therapy with  significant regression of the primary left upper lobe mass which  currently measures only 3.2 x 3.2 cm, and near complete resolution  of the majority of the previously noted pulmonary nodules, left  hilar and mediastinal adenopathy, as well as the previously noted  lesion in the  anterior aspect of the spleen. However, today's study  does demonstrate numerous sclerotic lesions throughout the  visualized axial and appendicular skeleton which are new compared to  the prior examination, which presumably represent widespread treated  skeletal metastatic disease.  2. There is a new sub cm low-attenuation lesion in segment 2 of the  liver which is highly nonspecific and warrants continued attention  on future followup studies, as this may be a small metastatic  lesion.  3. 4 mm nonobstructive calculus in the interpolar collecting system  of left kidney.  4. Additional incidental findings, as above.    Impression and Plan:   43 year old gentleman with the  following issues:  1. Stage IV adenocarcinoma of the lung with a left lung mass and bilateral pulmonary nodules. His tumor is EGFR mutated but the ALK mutation was not detected. CT scan on 02/09/2014 showed a clear response with tumor regression on Tarceva. He'll be due for another restaging CT scan in 2-3 months. Overall is tolerating the Tarceva relatively well with the exception of significant intermittent rash flares. I  2. Cough and shortness of breath: This is related to #1 seems to be improving at this time. Refill prescription for Ladona Ridgel was sent to his pharmacy of record via E. scribed  3.  Acneiform rash: This is calm down significantly. We'll decrease his prednisone to 40 mg by mouth daily. Patient was advised to try to take his total daily dose of prednisone no later than lunchtime. The should  improve his ability to sleep at night. Continue doxycycline indefinitely at this point.A  4. Follow up:  In one month to assess new complications. If his rash is better in 4 weeks we continue a prednisone slow taper.  5. Patient given alone or stating that he was cleared to return to work 48 hour days. He will let us know if further information or paperwork is required from his job.  Awilda Metro E PA-C   7/2/20152:42 PM

## 2014-03-11 NOTE — Patient Instructions (Signed)
Continue Tarceva at 150 mg by mouth daily Followup in one month

## 2014-04-04 ENCOUNTER — Other Ambulatory Visit (HOSPITAL_BASED_OUTPATIENT_CLINIC_OR_DEPARTMENT_OTHER): Payer: No Typology Code available for payment source

## 2014-04-04 ENCOUNTER — Other Ambulatory Visit: Payer: No Typology Code available for payment source

## 2014-04-04 ENCOUNTER — Ambulatory Visit (HOSPITAL_COMMUNITY)
Admission: RE | Admit: 2014-04-04 | Discharge: 2014-04-04 | Disposition: A | Discharge status: Home / Self Care | Payer: No Typology Code available for payment source | Source: Ambulatory Visit | Attending: Physician Assistant | Admitting: Physician Assistant

## 2014-04-04 ENCOUNTER — Encounter (HOSPITAL_COMMUNITY): Payer: Self-pay

## 2014-04-04 DIAGNOSIS — C3490 Malignant neoplasm of unspecified part of unspecified bronchus or lung: Secondary | ICD-10-CM

## 2014-04-04 DIAGNOSIS — C7951 Secondary malignant neoplasm of bone: Secondary | ICD-10-CM | POA: Insufficient documentation

## 2014-04-04 DIAGNOSIS — J9819 Other pulmonary collapse: Secondary | ICD-10-CM | POA: Insufficient documentation

## 2014-04-04 DIAGNOSIS — C7952 Secondary malignant neoplasm of bone marrow: Secondary | ICD-10-CM

## 2014-04-04 DIAGNOSIS — C3410 Malignant neoplasm of upper lobe, unspecified bronchus or lung: Secondary | ICD-10-CM | POA: Insufficient documentation

## 2014-04-04 DIAGNOSIS — J984 Other disorders of lung: Secondary | ICD-10-CM | POA: Insufficient documentation

## 2014-04-04 LAB — CBC WITH DIFFERENTIAL/PLATELET
BASO%: 0.6 % (ref 0.0–2.0)
Basophils Absolute: 0.1 10*3/uL (ref 0.0–0.1)
EOS%: 0.3 % (ref 0.0–7.0)
Eosinophils Absolute: 0 10*3/uL (ref 0.0–0.5)
HEMATOCRIT: 47.6 % (ref 38.4–49.9)
HGB: 16.1 g/dL (ref 13.0–17.1)
LYMPH%: 16.5 % (ref 14.0–49.0)
MCH: 29.9 pg (ref 27.2–33.4)
MCHC: 33.9 g/dL (ref 32.0–36.0)
MCV: 88.1 fL (ref 79.3–98.0)
MONO#: 0.6 10*3/uL (ref 0.1–0.9)
MONO%: 5.3 % (ref 0.0–14.0)
NEUT%: 77.3 % — AB (ref 39.0–75.0)
NEUTROS ABS: 8 10*3/uL — AB (ref 1.5–6.5)
Platelets: 279 10*3/uL (ref 140–400)
RBC: 5.4 10*6/uL (ref 4.20–5.82)
RDW: 13.9 % (ref 11.0–14.6)
WBC: 10.4 10*3/uL — AB (ref 4.0–10.3)
lymph#: 1.7 10*3/uL (ref 0.9–3.3)

## 2014-04-04 LAB — COMPREHENSIVE METABOLIC PANEL (CC13)
ALT: 37 U/L (ref 0–55)
AST: 20 U/L (ref 5–34)
Albumin: 3.7 g/dL (ref 3.5–5.0)
Alkaline Phosphatase: 76 U/L (ref 40–150)
Anion Gap: 10 mEq/L (ref 3–11)
BUN: 12.4 mg/dL (ref 7.0–26.0)
CALCIUM: 9.5 mg/dL (ref 8.4–10.4)
CO2: 24 mEq/L (ref 22–29)
Chloride: 107 mEq/L (ref 98–109)
Creatinine: 0.8 mg/dL (ref 0.7–1.3)
GLUCOSE: 96 mg/dL (ref 70–140)
Potassium: 3.3 mEq/L — ABNORMAL LOW (ref 3.5–5.1)
Sodium: 141 mEq/L (ref 136–145)
Total Bilirubin: 1.47 mg/dL — ABNORMAL HIGH (ref 0.20–1.20)
Total Protein: 6.6 g/dL (ref 6.4–8.3)

## 2014-04-04 MED ORDER — IOHEXOL 300 MG/ML  SOLN
100.0000 mL | Freq: Once | INTRAMUSCULAR | Status: AC | PRN
  Administered 2014-04-04: 100 mL via INTRAVENOUS

## 2014-04-06 ENCOUNTER — Telehealth: Payer: Self-pay | Admitting: Oncology

## 2014-04-06 ENCOUNTER — Ambulatory Visit (HOSPITAL_BASED_OUTPATIENT_CLINIC_OR_DEPARTMENT_OTHER): Payer: No Typology Code available for payment source | Admitting: Oncology

## 2014-04-06 ENCOUNTER — Encounter: Payer: Self-pay | Admitting: Oncology

## 2014-04-06 VITALS — BP 134/81 | HR 78 | Temp 98.6°F | Resp 20 | Ht 66.0 in | Wt 165.4 lb

## 2014-04-06 DIAGNOSIS — C3410 Malignant neoplasm of upper lobe, unspecified bronchus or lung: Secondary | ICD-10-CM

## 2014-04-06 DIAGNOSIS — C801 Malignant (primary) neoplasm, unspecified: Secondary | ICD-10-CM

## 2014-04-06 DIAGNOSIS — R21 Rash and other nonspecific skin eruption: Secondary | ICD-10-CM

## 2014-04-06 MED ORDER — DOXYCYCLINE HYCLATE 100 MG PO CAPS: 100.0000 mg | ORAL_CAPSULE | Freq: Two times a day (BID) | ORAL | Status: DC

## 2014-04-06 NOTE — Telephone Encounter (Signed)
Pt confirmed labs/ov per 07/29 POF, gave pt AVS....KJ

## 2014-04-06 NOTE — Progress Notes (Signed)
Hematology and Oncology Follow Up Visit  Chad George [id number] 06/03/1971 43 y.o. 04/06/2014 9:19 AM [name], Manfred Arch, MD   Principle Diagnosis: 43 year old gentleman with stage IV adenocarcinoma of the lung diagnosed in March of 2015. He presented with left upper lobe lung mass measuring 5.4 x 4.7 x 3.7 cm and bilateral pulmonary nodules. Pathology indicating adenocarcinoma of a lung primary. 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   Current therapy: Tarceva 150 mg daily started in March of 2015. Placed on hold 12/14/13 due to rash. This was resumed again on 12/24/2013.   Interim History:  Chad George presents today for follow-up visit. Since the last visit, he reports doing well with significant improvement in his rash. He is currently taking prednisone and doxycycline. He is still using moisturizer and Hydrocortisone routinely. He continues to have improvement in his energy and performance status. Is not reporting any new complications from the Tarceva such as diarrhea or shortness of breath. He has not reported any fevers or chills or sweats. He has not reported any difficulty breathing or stridor. He has not reported any difficulty swallowing or dehydration. He is sleeping better and his cough is improved. He is not reporting any headaches or blurry vision does not report any alteration of mental status psychiatric issues of depression. Is not reporting any chest pain or palpitation. Does not report any nausea or vomiting abdominal pain. Has not reported any hematochezia or melena. Has not reported any frequency urgency or hesitancy. Does not report any lymphadenopathy or petechiae. He does not report any skeletal pain. He is contemplating returning back to work he continues to feel like that. Rest of his review of system is unremarkable.  Medications: I have reviewed the patient's current medications.  Current Outpatient Prescriptions   Medication Sig Dispense Refill  . acetaminophen (TYLENOL) 325 MG tablet Take 650 mg by mouth every 6 (six) hours as needed.      Marland Kitchen albuterol (PROVENTIL HFA;VENTOLIN HFA) 108 (90 BASE) MCG/ACT inhaler Inhale 2 puffs into the lungs every 6 (six) hours as needed for wheezing or shortness of breath.  1 Inhaler  0  . benzonatate (TESSALON) 100 MG capsule Take 1 to 2 capsules by mouth every 8 hours as needed for cough  45 capsule  2  . chlorpheniramine-HYDROcodone (TUSSIONEX PENNKINETIC ER) 10-8 MG/5ML LQCR Take 5 mLs by mouth every 12 (twelve) hours as needed for cough.  1 Bottle  0  . doxycycline (VIBRAMYCIN) 100 MG capsule Take 1 capsule (100 mg total) by mouth 2 (two) times daily.  60 capsule  1  . erlotinib (TARCEVA) 150 MG tablet Take 1 tablet (150 mg total) by mouth daily. Take on an empty stomach 1 hour before meals or 2 hours after.  30 tablet  0  . fluticasone (FLONASE) 50 MCG/ACT nasal spray Place 2 sprays into both nostrils daily.  16 g  6  . guaiFENesin (MUCINEX) 600 MG 12 hr tablet Take by mouth 2 (two) times daily.      Marland Kitchen loratadine (CLARITIN) 10 MG tablet Take 1 tablet (10 mg total) by mouth daily.  30 tablet  11  . oxyCODONE (OXY IR/ROXICODONE) 5 MG immediate release tablet Take 1-2 tablets (5-10 mg total) by mouth every 6 (six) hours as needed for severe pain.  30 tablet  0  . predniSONE (DELTASONE) 20 MG tablet Take two tablets daily.      Marland Kitchen sulfamethoxazole-trimethoprim (BACTRIM DS) 800-160 MG per tablet  Take 1 tablet by mouth 2 (two) times daily.  20 tablet  0   No current facility-administered medications for this visit.     Allergies:  Allergies  Allergen Reactions  . Amoxicillin-Pot Clavulanate   . Penicillins     REACTION: RASH    Past Medical History, Surgical history, Social history, and Family History were reviewed and updated.    Physical Exam: Blood pressure 134/81, pulse 78, temperature 98.6 F (37 C), temperature source Oral, resp. rate 20, height 5' 6"  (1.676 m), weight 165 lb 6.4 oz (75.025 kg). ECOG: 0 General appearance: alert and cooperative not in any distress. Head: Normocephalic, no masses or lesions. Neck: no adenopathy, no thyroid masses. Lymph nodes: Cervical, supraclavicular, and axillary nodes normal. Heart:regular rate and rhythm, S1, S2 normal, no murmur, click, rub or gallop Lung:bilateral basilar rales heard. No wheezing or stridor.  Abdomen: soft, non-tender, without masses or organomegaly EXT:no erythema, induration, or nodules Skin: Diffuse acneiform rash on the face chest and back have decreased in intensity without any erythema or induration.  Lab Results: Lab Results  Component Value Date   WBC 10.4* 04/04/2014   HGB 16.1 04/04/2014   HCT 47.6 04/04/2014   MCV 88.1 04/04/2014   PLT 279 04/04/2014     Chemistry      Component Value Date/Time   NA 141 04/04/2014 1232   NA 141 11/18/2013 0457   K 3.3* 04/04/2014 1232   K 3.4* 11/18/2013 0457   CL 104 11/18/2013 0457   CO2 24 04/04/2014 1232   CO2 22 11/18/2013 0457   BUN 12.4 04/04/2014 1232   BUN 15 11/18/2013 0457   CREATININE 0.8 04/04/2014 1232   CREATININE 0.82 11/18/2013 0457   CREATININE 0.69 08/16/2013 1727      Component Value Date/Time   CALCIUM 9.5 04/04/2014 1232   CALCIUM 9.1 11/18/2013 0457   ALKPHOS 76 04/04/2014 1232   ALKPHOS 124* 11/16/2013 0630   AST 20 04/04/2014 1232   AST 18 11/16/2013 0630   ALT 37 04/04/2014 1232   ALT 17 11/16/2013 0630   BILITOT 1.47* 04/04/2014 1232   BILITOT 0.6 11/16/2013 0630     EXAM:  CT CHEST, ABDOMEN, AND PELVIS WITH CONTRAST  TECHNIQUE:  Multidetector CT imaging of the chest, abdomen and pelvis was  performed following the standard protocol during bolus  administration of intravenous contrast.  CONTRAST: 166m OMNIPAQUE IOHEXOL 300 MG/ML SOLN  COMPARISON: 02/09/2014  FINDINGS:  CT CHEST FINDINGS  Prevascular lymph node short axis diameter 0.7 cm, previously 0.8  cm, image 19 series 2. No pathologic thoracic  adenopathy is  currently identified. No pleural effusion or gross vascular  abnormality.  At the site of the tumor, there has been continued dramatic response  therapy, with only bandlike abnormal tissue tracking just above the  major fissure 2 score correspond to the original tumor. This is best  quantified by giving a vertical measure of the thickness of the band  of tissue because of difficulty measuring the finding on the axial  images. On sagittal image 87 of series 603, the residual opacity  measures 6 mm in vertical thickness, as compares to 18 mm in  vertical thickness on 02/09/2014 and 39 mm of vertical thickness on  11/15/2013.  The prior miliary nodules have not recurred. There is lingular  atelectasis or scarring. No significant right lung nodule is  identified.  Overall similar distribution and appearance of thoracic, sternal,  rib, and right scapular metastatic lesions.  CT ABDOMEN AND PELVIS FINDINGS  Prior hypodense lesion in the spleen 0.8 cm, formerly 1.2 cm. Small  hypodense lesion in segment 4b of the liver along the inferior  margin, stable. Prior segment 2 lesion is no longer visible.  Pancreas and adrenal glands normal. Appendix normal. No pathologic  upper abdominal adenopathy is observed. No pathologic pelvic  adenopathy is observed. Overall stable size and distribution of  scattered osseous metastatic lesions throughout the lumbar spine,  bony pelvis, and proximal femurs.  Urinary bladder unremarkable.  IMPRESSION:  1. Continued dramatic improvement in the primary left upper lobe  lesion, which currently is best seen on sagittal images as a 6 mm  thick band of abnormal tissue above the major fissure. This  represents a dramatic reduction and volume from the last several  exams. The prior miliary nodules have not recurred, and there is no  pathologic thoracic adenopathy.  2. Overall stable distribution and appearance of scattered osseous  metastatic  disease.  3. Previous equivocal segment 2 hypodensity is no longer well seen  in the liver. Further reduction in size of the splenic lesion noted.    Impression and Plan:   43 year old gentleman with the following issues:  1. Stage IV adenocarcinoma of the lung with a left lung mass and bilateral pulmonary nodules. His tumor is EGFR mutated but the ALK mutation was not detected. CT scan on 04/04/2014 was discussed today with the patient and showed continuous response with tumor regression on Tarceva. I plan on continuing this medication with the same dose and schedule and repeat CT scan in 3 months.  2. Cough and shortness of breath: This is related to #1 seems to be improving at this time.   3.  Acneiform rash: Appears to be improving today. I have instructed him to taper down the prednisone to 20 mg a day as well as continue with the doxycycline and moisturizer cream's. If his rash continues to improve in the next visit, we will reduce his dose to prednisone 10 mg the next 4 weeks.  4. Follow up:  In one month to assess new complications.   [facility] MD 7/29/20159:19 AM

## 2014-05-05 ENCOUNTER — Encounter: Payer: Self-pay | Admitting: Physician Assistant

## 2014-05-05 ENCOUNTER — Ambulatory Visit (HOSPITAL_BASED_OUTPATIENT_CLINIC_OR_DEPARTMENT_OTHER): Payer: No Typology Code available for payment source | Admitting: Physician Assistant

## 2014-05-05 ENCOUNTER — Telehealth: Payer: Self-pay | Admitting: Physician Assistant

## 2014-05-05 ENCOUNTER — Other Ambulatory Visit (HOSPITAL_BASED_OUTPATIENT_CLINIC_OR_DEPARTMENT_OTHER): Payer: No Typology Code available for payment source

## 2014-05-05 VITALS — BP 122/77 | HR 71 | Temp 98.2°F | Resp 18 | Ht 66.0 in | Wt 166.2 lb

## 2014-05-05 DIAGNOSIS — C3410 Malignant neoplasm of upper lobe, unspecified bronchus or lung: Secondary | ICD-10-CM

## 2014-05-05 DIAGNOSIS — IMO0002 Reserved for concepts with insufficient information to code with codable children: Secondary | ICD-10-CM

## 2014-05-05 DIAGNOSIS — R21 Rash and other nonspecific skin eruption: Secondary | ICD-10-CM

## 2014-05-05 DIAGNOSIS — C801 Malignant (primary) neoplasm, unspecified: Secondary | ICD-10-CM

## 2014-05-05 LAB — COMPREHENSIVE METABOLIC PANEL (CC13)
ALBUMIN: 3.7 g/dL (ref 3.5–5.0)
ALT: 29 U/L (ref 0–55)
ANION GAP: 9 meq/L (ref 3–11)
AST: 19 U/L (ref 5–34)
Alkaline Phosphatase: 76 U/L (ref 40–150)
BUN: 9.4 mg/dL (ref 7.0–26.0)
CALCIUM: 9.2 mg/dL (ref 8.4–10.4)
CHLORIDE: 106 meq/L (ref 98–109)
CO2: 26 meq/L (ref 22–29)
CREATININE: 0.7 mg/dL (ref 0.7–1.3)
Glucose: 89 mg/dl (ref 70–140)
Potassium: 3.1 mEq/L — ABNORMAL LOW (ref 3.5–5.1)
Sodium: 141 mEq/L (ref 136–145)
Total Bilirubin: 1.28 mg/dL — ABNORMAL HIGH (ref 0.20–1.20)
Total Protein: 6.5 g/dL (ref 6.4–8.3)

## 2014-05-05 LAB — CBC WITH DIFFERENTIAL/PLATELET
BASO%: 0.1 % (ref 0.0–2.0)
BASOS ABS: 0 10*3/uL (ref 0.0–0.1)
EOS ABS: 0.1 10*3/uL (ref 0.0–0.5)
EOS%: 1.1 % (ref 0.0–7.0)
HCT: 44.6 % (ref 38.4–49.9)
HEMOGLOBIN: 16.1 g/dL (ref 13.0–17.1)
LYMPH%: 21.3 % (ref 14.0–49.0)
MCH: 30.8 pg (ref 27.2–33.4)
MCHC: 36.1 g/dL — ABNORMAL HIGH (ref 32.0–36.0)
MCV: 85.4 fL (ref 79.3–98.0)
MONO#: 0.4 10*3/uL (ref 0.1–0.9)
MONO%: 5.3 % (ref 0.0–14.0)
NEUT%: 72.2 % (ref 39.0–75.0)
NEUTROS ABS: 5.8 10*3/uL (ref 1.5–6.5)
NRBC: 0 % (ref 0–0)
Platelets: 215 10*3/uL (ref 140–400)
RBC: 5.22 10*6/uL (ref 4.20–5.82)
RDW: 12.7 % (ref 11.0–14.6)
WBC: 8 10*3/uL (ref 4.0–10.3)
lymph#: 1.7 10*3/uL (ref 0.9–3.3)

## 2014-05-05 NOTE — Telephone Encounter (Signed)
Pt confirmed labs/ov per 08/27 POF, gave pt AVS..Marland KitchenKJ

## 2014-05-05 NOTE — Progress Notes (Signed)
Hematology and Oncology Follow Up Visit  Lenus Trauger [id number] 01/13/1971 43 y.o. 05/05/2014 9:27 AM [name], [name], MDJegede, [name], MD   Principle Diagnosis: 43 year old gentleman with stage IV adenocarcinoma of the lung diagnosed in March of 2015. He presented with left upper lobe lung mass measuring 5.4 x 4.7 x 3.7 cm and bilateral pulmonary nodules. Pathology indicating adenocarcinoma of a lung primary. 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   Current therapy: Tarceva 150 mg daily started in March of 2015. Placed on hold 12/14/13 due to rash. This was resumed again on 12/24/2013.   Interim History:  Mr. Cwikla presents today for follow-up visit. Since the last visit, he reports doing well with significant improvement in his rash. He is currently taking prednisone and doxycycline.His is currently taking 20 mg of prednisone daily. He is still using moisturizer and Hydrocortisone routinely. He continues to have improvement in his energy and performance status. Is not reporting any new complications from the Tarceva such as diarrhea or shortness of breath. He has not reported any fevers or chills or sweats. He has not reported any difficulty breathing or stridor. He has not reported any difficulty swallowing or dehydration. He continues to have a cough but overall it is much improved. He is sleeping better. He is not reporting any headaches or blurry vision does not report any alteration of mental status psychiatric issues of depression. Is not reporting any chest pain or palpitation. Does not report any nausea or vomiting abdominal pain. Has not reported any hematochezia or melena. Has not reported any frequency urgency or hesitancy. Does not report any lymphadenopathy or petechiae. He does not report any skeletal pain.he does report paronychia affecting his toes and fingernails. Primarily the great toe on both feet and around the thumbs and index fingers of the  hands. He soak his hands and warm water with baking soda and was able to express "a lot of pus". The skin has peeled and now is healing. Remainder of his review of system is unremarkable.  Medications: I have reviewed the patient's current medications.  Current Outpatient Prescriptions  Medication Sig Dispense Refill  . benzonatate (TESSALON) 100 MG capsule Take 1 to 2 capsules by mouth every 8 hours as needed for cough  45 capsule  2  . chlorpheniramine-HYDROcodone (TUSSIONEX PENNKINETIC ER) 10-8 MG/5ML LQCR Take 5 mLs by mouth every 12 (twelve) hours as needed for cough.  1 Bottle  0  . doxycycline (VIBRAMYCIN) 100 MG capsule Take 1 capsule (100 mg total) by mouth 2 (two) times daily.  60 capsule  1  . erlotinib (TARCEVA) 150 MG tablet Take 1 tablet (150 mg total) by mouth daily. Take on an empty stomach 1 hour before meals or 2 hours after.  30 tablet  0  . fluticasone (FLONASE) 50 MCG/ACT nasal spray Place 2 sprays into both nostrils daily.  16 g  6  . guaiFENesin (MUCINEX) 600 MG 12 hr tablet Take by mouth 2 (two) times daily.      Marland Kitchen loratadine (CLARITIN) 10 MG tablet Take 1 tablet (10 mg total) by mouth daily.  30 tablet  11  . oxyCODONE (OXY IR/ROXICODONE) 5 MG immediate release tablet Take 1-2 tablets (5-10 mg total) by mouth every 6 (six) hours as needed for severe pain.  30 tablet  0  . predniSONE (DELTASONE) 20 MG tablet Take two tablets daily.      Marland Kitchen acetaminophen (TYLENOL) 325 MG tablet Take 650 mg  by mouth every 6 (six) hours as needed.      Marland Kitchen albuterol (PROVENTIL HFA;VENTOLIN HFA) 108 (90 BASE) MCG/ACT inhaler Inhale 2 puffs into the lungs every 6 (six) hours as needed for wheezing or shortness of breath.  1 Inhaler  0  . sulfamethoxazole-trimethoprim (BACTRIM DS) 800-160 MG per tablet Take 1 tablet by mouth 2 (two) times daily.  20 tablet  0   No current facility-administered medications for this visit.     Allergies:  Allergies  Allergen Reactions  . Amoxicillin-Pot  Clavulanate   . Penicillins     REACTION: RASH    Past Medical History, Surgical history, Social history, and Family History were reviewed and updated.    Physical Exam: Blood pressure 122/77, pulse 71, temperature 98.2 F (36.8 C), temperature source Oral, resp. rate 18, height 5\' 6"  (1.676 m), weight 166 lb 3.2 oz (75.388 kg). ECOG: 0 General appearance: alert and cooperative not in any distress. Head: Normocephalic, no masses or lesions. Neck: no adenopathy, no thyroid masses. Lymph nodes: Cervical, supraclavicular, and axillary nodes normal. Heart:regular rate and rhythm, S1, S2 normal, no murmur, click, rub or gallop Lung:bilateral basilar rales heard. No wheezing or stridor.  Abdomen: soft, non-tender, without masses or organomegaly EXT:no erythema, induration, or nodules Skin: Diffuse acneiform rash on the face chest and back have decreased in intensity without any erythema or induration. No evidence of superinfection Skin: Evidence of resolved paronychia and out the great his bilaterally as well as the thumb and index fingers of both hands. No active paronychia.  Lab Results: Lab Results  Component Value Date   WBC 8.0 05/05/2014   HGB 16.1 05/05/2014   HCT 44.6 05/05/2014   MCV 85.4 05/05/2014   PLT 215 05/05/2014     Chemistry      Component Value Date/Time   NA 141 05/05/2014 0822   NA 141 11/18/2013 0457   K 3.1* 05/05/2014 0822   K 3.4* 11/18/2013 0457   CL 104 11/18/2013 0457   CO2 26 05/05/2014 0822   CO2 22 11/18/2013 0457   BUN 9.4 05/05/2014 0822   BUN 15 11/18/2013 0457   CREATININE 0.7 05/05/2014 0822   CREATININE 0.82 11/18/2013 0457   CREATININE 0.69 08/16/2013 1727      Component Value Date/Time   CALCIUM 9.2 05/05/2014 0822   CALCIUM 9.1 11/18/2013 0457   ALKPHOS 76 05/05/2014 0822   ALKPHOS 124* 11/16/2013 0630   AST 19 05/05/2014 0822   AST 18 11/16/2013 0630   ALT 29 05/05/2014 0822   ALT 17 11/16/2013 0630   BILITOT 1.28* 05/05/2014 0822   BILITOT 0.6  11/16/2013 0630     EXAM:  CT CHEST, ABDOMEN, AND PELVIS WITH CONTRAST  TECHNIQUE:  Multidetector CT imaging of the chest, abdomen and pelvis was  performed following the standard protocol during bolus  administration of intravenous contrast.  CONTRAST: 01/16/2014 OMNIPAQUE IOHEXOL 300 MG/ML SOLN  COMPARISON: 02/09/2014  FINDINGS:  CT CHEST FINDINGS  Prevascular lymph node short axis diameter 0.7 cm, previously 0.8  cm, image 19 series 2. No pathologic thoracic adenopathy is  currently identified. No pleural effusion or gross vascular  abnormality.  At the site of the tumor, there has been continued dramatic response  therapy, with only bandlike abnormal tissue tracking just above the  major fissure 2 score correspond to the original tumor. This is best  quantified by giving a vertical measure of the thickness of the band  of tissue because of difficulty measuring  the finding on the axial  images. On sagittal image 87 of series 603, the residual opacity  measures 6 mm in vertical thickness, as compares to 18 mm in  vertical thickness on 02/09/2014 and 39 mm of vertical thickness on  11/15/2013.  The prior miliary nodules have not recurred. There is lingular  atelectasis or scarring. No significant right lung nodule is  identified.  Overall similar distribution and appearance of thoracic, sternal,  rib, and right scapular metastatic lesions.  CT ABDOMEN AND PELVIS FINDINGS  Prior hypodense lesion in the spleen 0.8 cm, formerly 1.2 cm. Small  hypodense lesion in segment 4b of the liver along the inferior  margin, stable. Prior segment 2 lesion is no longer visible.  Pancreas and adrenal glands normal. Appendix normal. No pathologic  upper abdominal adenopathy is observed. No pathologic pelvic  adenopathy is observed. Overall stable size and distribution of  scattered osseous metastatic lesions throughout the lumbar spine,  bony pelvis, and proximal femurs.  Urinary bladder  unremarkable.  IMPRESSION:  1. Continued dramatic improvement in the primary left upper lobe  lesion, which currently is best seen on sagittal images as a 6 mm  thick band of abnormal tissue above the major fissure. This  represents a dramatic reduction and volume from the last several  exams. The prior miliary nodules have not recurred, and there is no  pathologic thoracic adenopathy.  2. Overall stable distribution and appearance of scattered osseous  metastatic disease.  3. Previous equivocal segment 2 hypodensity is no longer well seen  in the liver. Further reduction in size of the splenic lesion noted.    Impression and Plan:   43 year old gentleman with the following issues:  1. Stage IV adenocarcinoma of the lung with a left lung mass and bilateral pulmonary nodules. His tumor is EGFR mutated but the ALK mutation was not detected. CT scan on 04/04/2014 was discussed today with the patient and showed continuous response with tumor regression on Tarceva. We plan on continuing this medication with the same dose and schedule and repeat CT scan in October 2015.  2. Cough and shortness of breath: This is related to #1, continues to improve   3.  Acneiform rash: Appears to be improving today. I have instructed him to taper down the prednisone to 10 mg a day as well as continue with the doxycycline and moisturizer cream's.He will continue on prednisone at 10 mg by mouth daily until he returns for another symptom management visit in 4 weeks.  4. Paronychia-likely related to Tarceva therapy. Patient is to continue local treatment as needed. He may also use Epsom salt soaks. Should these measures fail to control the symptoms he is to present her further evaluation and management. Patient voiced understanding.  5. Follow up:  In one month to assess new complications.   Awilda Metro E PA-C  8/27/20159:27 AM

## 2014-05-06 NOTE — Patient Instructions (Signed)
Decrease your prednisone to 10 mg by mouth daily with food Follow up in 1 month

## 2014-05-23 ENCOUNTER — Ambulatory Visit (HOSPITAL_BASED_OUTPATIENT_CLINIC_OR_DEPARTMENT_OTHER): Payer: No Typology Code available for payment source | Admitting: Physician Assistant

## 2014-05-23 ENCOUNTER — Telehealth: Payer: Self-pay | Admitting: *Deleted

## 2014-05-23 ENCOUNTER — Other Ambulatory Visit: Payer: Self-pay | Admitting: *Deleted

## 2014-05-23 ENCOUNTER — Other Ambulatory Visit (HOSPITAL_BASED_OUTPATIENT_CLINIC_OR_DEPARTMENT_OTHER): Payer: No Typology Code available for payment source

## 2014-05-23 ENCOUNTER — Encounter: Payer: Self-pay | Admitting: Physician Assistant

## 2014-05-23 VITALS — BP 119/81 | HR 74 | Temp 98.6°F | Resp 19 | Ht 66.0 in | Wt 165.9 lb

## 2014-05-23 DIAGNOSIS — C3410 Malignant neoplasm of upper lobe, unspecified bronchus or lung: Secondary | ICD-10-CM

## 2014-05-23 DIAGNOSIS — IMO0002 Reserved for concepts with insufficient information to code with codable children: Secondary | ICD-10-CM

## 2014-05-23 DIAGNOSIS — R0602 Shortness of breath: Secondary | ICD-10-CM

## 2014-05-23 DIAGNOSIS — L03039 Cellulitis of unspecified toe: Secondary | ICD-10-CM

## 2014-05-23 DIAGNOSIS — R05 Cough: Secondary | ICD-10-CM

## 2014-05-23 DIAGNOSIS — L03032 Cellulitis of left toe: Secondary | ICD-10-CM

## 2014-05-23 DIAGNOSIS — C801 Malignant (primary) neoplasm, unspecified: Secondary | ICD-10-CM

## 2014-05-23 DIAGNOSIS — R21 Rash and other nonspecific skin eruption: Secondary | ICD-10-CM

## 2014-05-23 DIAGNOSIS — R059 Cough, unspecified: Secondary | ICD-10-CM

## 2014-05-23 LAB — CBC WITH DIFFERENTIAL/PLATELET
BASO%: 0.2 % (ref 0.0–2.0)
BASOS ABS: 0 10*3/uL (ref 0.0–0.1)
EOS%: 1.8 % (ref 0.0–7.0)
Eosinophils Absolute: 0.1 10*3/uL (ref 0.0–0.5)
HCT: 42.1 % (ref 38.4–49.9)
HEMOGLOBIN: 14.9 g/dL (ref 13.0–17.1)
LYMPH#: 1.3 10*3/uL (ref 0.9–3.3)
LYMPH%: 23 % (ref 14.0–49.0)
MCH: 30.7 pg (ref 27.2–33.4)
MCHC: 35.4 g/dL (ref 32.0–36.0)
MCV: 86.6 fL (ref 79.3–98.0)
MONO#: 0.5 10*3/uL (ref 0.1–0.9)
MONO%: 9 % (ref 0.0–14.0)
NEUT%: 66 % (ref 39.0–75.0)
NEUTROS ABS: 3.6 10*3/uL (ref 1.5–6.5)
Platelets: 219 10*3/uL (ref 140–400)
RBC: 4.86 10*6/uL (ref 4.20–5.82)
RDW: 12.7 % (ref 11.0–14.6)
WBC: 5.4 10*3/uL (ref 4.0–10.3)

## 2014-05-23 MED ORDER — CEPHALEXIN 500 MG PO CAPS: 500.0000 mg | ORAL_CAPSULE | Freq: Four times a day (QID) | ORAL | Status: DC

## 2014-05-23 NOTE — Patient Instructions (Signed)
Continue Tarceva 150 mg by mouth daily  Continue taking the prednisone 10 mg by mouth daily until your followup visit with Dr. Alen Blew Take the antibiotic as prescribed Followup with Dr. Alen Blew as previously scheduled

## 2014-05-23 NOTE — Telephone Encounter (Signed)
Spoke with patient, per adrena no need to take pro-biotic with keflex. Patient verbalizes understanding.

## 2014-05-23 NOTE — Progress Notes (Signed)
Hematology and Oncology Follow Up Visit  Chad George [id number] 1971-04-13 43 y.o. 05/23/2014 3:11 PM George, Chad Dare, MDJegede, Chad Clipper, MD   Principle Diagnosis: 43 year old gentleman with stage IV adenocarcinoma of the lung diagnosed in March of 2015. He presented with left upper lobe lung mass measuring 5.4 x 4.7 x 3.7 cm and bilateral pulmonary nodules. Pathology indicating adenocarcinoma of a lung primary. 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   Current therapy: Tarceva 150 mg daily started in March of 2015. Placed on hold 12/14/13 due to rash. This was resumed again on 12/24/2013.   Interim History:  Mr. Scialdone presents today for work-in visit. He complains of issues with paronychia involving both great toes over the past 2 weeks. He is been able to express a lot of "pus" out of both infected toes with continued complaints of redness and tenderness. He denies fever or chills. He is tried soaking as before however this particular episode of paronychia seems to be lasted longer. He continues to tolerate his Tarceva well with the exception of occasional episodes of diarrhea are well managed with Imodium as well as a grade 1-2 acneform skin rash. He is currently on a prednisone taper, currently down to 10 mg by mouth daily. He is still using moisturizer and Hydrocortisone routinely. He continues to have improvement in his energy and performance status. Is not reporting any new complications from the Tarceva such as diarrhea or shortness of breath. He has not reported any fevers or chills or sweats. He has not reported any difficulty breathing or stridor. He has not reported any difficulty swallowing or dehydration. He continues to have a cough but overall it is much improved. He is sleeping better. He is not reporting any headaches or blurry vision does not report any alteration of mental status psychiatric issues of depression. Is not reporting any chest pain or  palpitation. Does not report any nausea or vomiting abdominal pain. Has not reported any hematochezia or melena. Has not reported any frequency urgency or hesitancy. Does not report any lymphadenopathy or petechiae. He does not report any skeletal pain.he does report paronychia affecting his toes and fingernails.  Remainder of his review of system is unremarkable.  Medications: I have reviewed the patient's current medications.  Current Outpatient Prescriptions  Medication Sig Dispense Refill  . acetaminophen (TYLENOL) 325 MG tablet Take 650 mg by mouth every 6 (six) hours as needed.      Marland Kitchen albuterol (PROVENTIL HFA;VENTOLIN HFA) 108 (90 BASE) MCG/ACT inhaler Inhale 2 puffs into the lungs every 6 (six) hours as needed for wheezing or shortness of breath.  1 Inhaler  0  . benzonatate (TESSALON) 100 MG capsule Take 1 to 2 capsules by mouth every 8 hours as needed for cough  45 capsule  2  . chlorpheniramine-HYDROcodone (TUSSIONEX PENNKINETIC ER) 10-8 MG/5ML LQCR Take 5 mLs by mouth every 12 (twelve) hours as needed for cough.  1 Bottle  0  . doxycycline (VIBRAMYCIN) 100 MG capsule Take 1 capsule (100 mg total) by mouth 2 (two) times daily.  60 capsule  1  . erlotinib (TARCEVA) 150 MG tablet Take 1 tablet (150 mg total) by mouth daily. Take on an empty stomach 1 hour before meals or 2 hours after.  30 tablet  0  . fluticasone (FLONASE) 50 MCG/ACT nasal spray Place 2 sprays into both nostrils daily.  16 g  6  . guaiFENesin (MUCINEX) 600 MG 12 hr tablet Take by  mouth 2 (two) times daily.      Marland Kitchen loratadine (CLARITIN) 10 MG tablet Take 1 tablet (10 mg total) by mouth daily.  30 tablet  11  . oxyCODONE (OXY IR/ROXICODONE) 5 MG immediate release tablet Take 1-2 tablets (5-10 mg total) by mouth every 6 (six) hours as needed for severe pain.  30 tablet  0  . predniSONE (DELTASONE) 20 MG tablet Take two tablets daily.      . cephALEXin (KEFLEX) 500 MG capsule Take 1 capsule (500 mg total) by mouth 4 (four) times  daily.  40 capsule  0  . sulfamethoxazole-trimethoprim (BACTRIM DS) 800-160 MG per tablet Take 1 tablet by mouth 2 (two) times daily.  20 tablet  0   No current facility-administered medications for this visit.     Allergies:  Allergies  Allergen Reactions  . Amoxicillin-Pot Clavulanate   . Penicillins     REACTION: RASH    Past Medical History, Surgical history, Social history, and Family History were reviewed and updated.    Physical Exam: Blood pressure 119/81, pulse 74, temperature 98.6 F (37 C), temperature source Oral, resp. rate 19, height $RemoveBe'5\' 6"'izeQNlVKe$  (1.676 m), weight 165 lb 14.4 oz (75.252 kg), SpO2 99.00%. ECOG: 0 General appearance: alert and cooperative not in any distress. Head: Normocephalic, no masses or lesions. Neck: no adenopathy, no thyroid masses. Lymph nodes: Cervical, supraclavicular, and axillary nodes normal. Heart:regular rate and rhythm, S1, S2 normal, no murmur, click, rub or gallop Lung:bilateral basilar rales heard. No wheezing or stridor.  Abdomen: soft, non-tender, without masses or organomegaly EXT:no erythema, induration, or nodules Skin: Diffuse acneiform rash on the face chest and back have decreased in intensity without any erythema or induration. No evidence of superinfection Skin: Evidence of active paronychia affecting the great toes bilaterally, left greater than right. No active oozing purulent material however there is erythema and fluctuance with a significant tenderness more laterally then medially around both the great toenails as well as the top of the toe.   Grade 1-2 acneform skin eruptions primarily affecting the face, no evidence of superinfection  Lab Results: Lab Results  Component Value Date   WBC 5.4 05/23/2014   HGB 14.9 05/23/2014   HCT 42.1 05/23/2014   MCV 86.6 05/23/2014   PLT 219 05/23/2014     Chemistry      Component Value Date/Time   NA 141 05/05/2014 0822   NA 141 11/18/2013 0457   K 3.1* 05/05/2014 0822   K 3.4*  11/18/2013 0457   CL 104 11/18/2013 0457   CO2 26 05/05/2014 0822   CO2 22 11/18/2013 0457   BUN 9.4 05/05/2014 0822   BUN 15 11/18/2013 0457   CREATININE 0.7 05/05/2014 0822   CREATININE 0.82 11/18/2013 0457   CREATININE 0.69 08/16/2013 1727      Component Value Date/Time   CALCIUM 9.2 05/05/2014 0822   CALCIUM 9.1 11/18/2013 0457   ALKPHOS 76 05/05/2014 0822   ALKPHOS 124* 11/16/2013 0630   AST 19 05/05/2014 0822   AST 18 11/16/2013 0630   ALT 29 05/05/2014 0822   ALT 17 11/16/2013 0630   BILITOT 1.28* 05/05/2014 0822   BILITOT 0.6 11/16/2013 0630     EXAM:  CT CHEST, ABDOMEN, AND PELVIS WITH CONTRAST  TECHNIQUE:  Multidetector CT imaging of the chest, abdomen and pelvis was  performed following the standard protocol during bolus  administration of intravenous contrast.  CONTRAST: 120mL OMNIPAQUE IOHEXOL 300 MG/ML SOLN  COMPARISON: 02/09/2014  FINDINGS:  CT CHEST FINDINGS  Prevascular lymph node short axis diameter 0.7 cm, previously 0.8  cm, image 19 series 2. No pathologic thoracic adenopathy is  currently identified. No pleural effusion or gross vascular  abnormality.  At the site of the tumor, there has been continued dramatic response  therapy, with only bandlike abnormal tissue tracking just above the  major fissure 2 score correspond to the original tumor. This is best  quantified by giving a vertical measure of the thickness of the band  of tissue because of difficulty measuring the finding on the axial  images. On sagittal image 87 of series 603, the residual opacity  measures 6 mm in vertical thickness, as compares to 18 mm in  vertical thickness on 02/09/2014 and 39 mm of vertical thickness on  11/15/2013.  The prior miliary nodules have not recurred. There is lingular  atelectasis or scarring. No significant right lung nodule is  identified.  Overall similar distribution and appearance of thoracic, sternal,  rib, and right scapular metastatic lesions.  CT ABDOMEN AND  PELVIS FINDINGS  Prior hypodense lesion in the spleen 0.8 cm, formerly 1.2 cm. Small  hypodense lesion in segment 4b of the liver along the inferior  margin, stable. Prior segment 2 lesion is no longer visible.  Pancreas and adrenal glands normal. Appendix normal. No pathologic  upper abdominal adenopathy is observed. No pathologic pelvic  adenopathy is observed. Overall stable size and distribution of  scattered osseous metastatic lesions throughout the lumbar spine,  bony pelvis, and proximal femurs.  Urinary bladder unremarkable.  IMPRESSION:  1. Continued dramatic improvement in the primary left upper lobe  lesion, which currently is best seen on sagittal images as a 6 mm  thick band of abnormal tissue above the major fissure. This  represents a dramatic reduction and volume from the last several  exams. The prior miliary nodules have not recurred, and there is no  pathologic thoracic adenopathy.  2. Overall stable distribution and appearance of scattered osseous  metastatic disease.  3. Previous equivocal segment 2 hypodensity is no longer well seen  in the liver. Further reduction in size of the splenic lesion noted.    Impression and Plan:   43 year old gentleman with the following issues:  1. Stage IV adenocarcinoma of the lung with a left lung mass and bilateral pulmonary nodules. His tumor is EGFR mutated but the ALK mutation was not detected. CT scan on 04/04/2014 was discussed today with the patient and showed continuous response with tumor regression on Tarceva. We plan on continuing this medication with the same dose and schedule and repeat CT scan in October 2015.  2. Cough and shortness of breath: This is related to #1, continues to improve   3.  Acneiform rash: Appears to be improving today. I have instructed him to taper down the prednisone to 10 mg a day as well as continue with the doxycycline and moisturizer cream's.He will continue on prednisone at 10 mg by mouth  daily until he returns for another symptom management visit in 4 weeks.  4. Paronychia-likely related to Tarceva therapy. Recent episodes of paronychia has failed localize treatment including a consult soaks. There is erythema and tenderness and fluctuance although no current purulent drainage. Initially had planned to place the patient a course of Augmentin however he is allergic to this class of medication.: State treated with a 10 day course of Keflex at 5 mg by mouth 4 times daily.  5. Follow up:  As previously scheduled  Awilda Metro E PA-C  9/14/20153:11 PM

## 2014-05-31 ENCOUNTER — Ambulatory Visit: Payer: No Typology Code available for payment source | Attending: Internal Medicine

## 2014-06-03 ENCOUNTER — Other Ambulatory Visit (HOSPITAL_BASED_OUTPATIENT_CLINIC_OR_DEPARTMENT_OTHER): Payer: Self-pay

## 2014-06-03 ENCOUNTER — Ambulatory Visit (HOSPITAL_BASED_OUTPATIENT_CLINIC_OR_DEPARTMENT_OTHER): Payer: Self-pay | Admitting: Oncology

## 2014-06-03 ENCOUNTER — Encounter: Payer: Self-pay | Admitting: Oncology

## 2014-06-03 ENCOUNTER — Telehealth: Payer: Self-pay | Admitting: Oncology

## 2014-06-03 VITALS — BP 126/86 | HR 75 | Temp 98.6°F | Resp 18 | Ht 66.0 in | Wt 168.5 lb

## 2014-06-03 DIAGNOSIS — C801 Malignant (primary) neoplasm, unspecified: Secondary | ICD-10-CM

## 2014-06-03 DIAGNOSIS — C3410 Malignant neoplasm of upper lobe, unspecified bronchus or lung: Secondary | ICD-10-CM

## 2014-06-03 DIAGNOSIS — L03032 Cellulitis of left toe: Secondary | ICD-10-CM

## 2014-06-03 DIAGNOSIS — R0602 Shortness of breath: Secondary | ICD-10-CM

## 2014-06-03 DIAGNOSIS — R05 Cough: Secondary | ICD-10-CM

## 2014-06-03 DIAGNOSIS — R059 Cough, unspecified: Secondary | ICD-10-CM

## 2014-06-03 DIAGNOSIS — R21 Rash and other nonspecific skin eruption: Secondary | ICD-10-CM

## 2014-06-03 DIAGNOSIS — L03039 Cellulitis of unspecified toe: Secondary | ICD-10-CM

## 2014-06-03 LAB — CBC WITH DIFFERENTIAL/PLATELET
BASO%: 0.7 % (ref 0.0–2.0)
BASOS ABS: 0 10*3/uL (ref 0.0–0.1)
EOS ABS: 0.1 10*3/uL (ref 0.0–0.5)
EOS%: 2.2 % (ref 0.0–7.0)
HCT: 45.5 % (ref 38.4–49.9)
HEMOGLOBIN: 15.5 g/dL (ref 13.0–17.1)
LYMPH%: 23.6 % (ref 14.0–49.0)
MCH: 30 pg (ref 27.2–33.4)
MCHC: 34.2 g/dL (ref 32.0–36.0)
MCV: 87.9 fL (ref 79.3–98.0)
MONO#: 0.4 10*3/uL (ref 0.1–0.9)
MONO%: 6.6 % (ref 0.0–14.0)
NEUT%: 66.9 % (ref 39.0–75.0)
NEUTROS ABS: 4.3 10*3/uL (ref 1.5–6.5)
Platelets: 254 10*3/uL (ref 140–400)
RBC: 5.18 10*6/uL (ref 4.20–5.82)
RDW: 13.2 % (ref 11.0–14.6)
WBC: 6.5 10*3/uL (ref 4.0–10.3)
lymph#: 1.5 10*3/uL (ref 0.9–3.3)

## 2014-06-03 LAB — COMPREHENSIVE METABOLIC PANEL (CC13)
ALBUMIN: 3.6 g/dL (ref 3.5–5.0)
ALT: 22 U/L (ref 0–55)
AST: 17 U/L (ref 5–34)
Alkaline Phosphatase: 77 U/L (ref 40–150)
Anion Gap: 11 mEq/L (ref 3–11)
BUN: 9.4 mg/dL (ref 7.0–26.0)
CO2: 25 meq/L (ref 22–29)
Calcium: 9.2 mg/dL (ref 8.4–10.4)
Chloride: 107 mEq/L (ref 98–109)
Creatinine: 0.8 mg/dL (ref 0.7–1.3)
GLUCOSE: 89 mg/dL (ref 70–140)
POTASSIUM: 3.3 meq/L — AB (ref 3.5–5.1)
SODIUM: 143 meq/L (ref 136–145)
TOTAL PROTEIN: 6.2 g/dL — AB (ref 6.4–8.3)
Total Bilirubin: 0.98 mg/dL (ref 0.20–1.20)

## 2014-06-03 MED ORDER — OXYCODONE HCL 5 MG PO TABS: 5.0000 mg | ORAL_TABLET | Freq: Four times a day (QID) | ORAL | Status: DC | PRN

## 2014-06-03 MED ORDER — SULFAMETHOXAZOLE-TMP DS 800-160 MG PO TABS: 1.0000 | ORAL_TABLET | Freq: Two times a day (BID) | ORAL | Status: DC

## 2014-06-03 NOTE — Telephone Encounter (Signed)
Pt confirmed labs/ov per 09/25 POF, gave pt AVS......Marland Kitchen  KJ

## 2014-06-03 NOTE — Progress Notes (Signed)
Hematology and Oncology Follow Up Visit  Tameem Pullara [id number] Jun 21, 1971 43 y.o. 06/03/2014 12:55 PM [name], [name], MDJegede, [name], MD   Principle Diagnosis: 43 year old gentleman with stage IV adenocarcinoma of the lung diagnosed in March of 2015. He presented with left upper lobe lung mass measuring 5.4 x 4.7 x 3.7 cm and bilateral pulmonary nodules. Pathology indicating adenocarcinoma of a lung primary. 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   Current therapy: Tarceva 150 mg daily started in March of 2015. Placed on hold 12/14/13 due to rash. This was resumed again on 12/24/2013.   Interim History:  Mr. Higginson presents today for routine followup. He continues to have issues with paronychia involving both great toes and his thumbs. He is been able to express a lot of "pus" out of these areas with continued complaints of redness and tenderness. He completed a 10 day course of Keflex yesterday. He thinks that Bactrim helped better. He remains on doxycycline twice a day. He denies fever or chills. He has not been soaking his toes or fingers. He continues to tolerate his Tarceva well with the exception of occasional episodes of diarrhea are well managed with Imodium as well as a grade 1-2 acneform skin rash. He is currently on a prednisone taper, currently down to 10 mg by mouth daily. He is still using moisturizer and Hydrocortisone routinely. He continues to have improvement in his energy and performance status. Is not reporting any new complications from the Tarceva such as diarrhea or shortness of breath. He has not reported any fevers or chills or sweats. He has not reported any difficulty breathing or stridor. He has not reported any difficulty swallowing or dehydration. He continues to have a cough but overall it is much improved. He is sleeping better. He is not reporting any headaches or blurry vision does not report any alteration of mental status  psychiatric issues of depression. Is not reporting any chest pain or palpitation. Does not report any nausea or vomiting abdominal pain. Has not reported any hematochezia or melena. Has not reported any frequency urgency or hesitancy. Does not report any lymphadenopathy or petechiae. He does not report any skeletal pain.he does report paronychia affecting his toes and fingernails.  Remainder of his review of system is unremarkable.  Medications: I have reviewed the patient's current medications.  Current Outpatient Prescriptions  Medication Sig Dispense Refill  . acetaminophen (TYLENOL) 325 MG tablet Take 650 mg by mouth every 6 (six) hours as needed.      Marland Kitchen albuterol (PROVENTIL HFA;VENTOLIN HFA) 108 (90 BASE) MCG/ACT inhaler Inhale 2 puffs into the lungs every 6 (six) hours as needed for wheezing or shortness of breath.  1 Inhaler  0  . benzonatate (TESSALON) 100 MG capsule Take 1 to 2 capsules by mouth every 8 hours as needed for cough  45 capsule  2  . chlorpheniramine-HYDROcodone (TUSSIONEX PENNKINETIC ER) 10-8 MG/5ML LQCR Take 5 mLs by mouth every 12 (twelve) hours as needed for cough.  1 Bottle  0  . doxycycline (VIBRAMYCIN) 100 MG capsule Take 1 capsule (100 mg total) by mouth 2 (two) times daily.  60 capsule  1  . erlotinib (TARCEVA) 150 MG tablet Take 1 tablet (150 mg total) by mouth daily. Take on an empty stomach 1 hour before meals or 2 hours after.  30 tablet  0  . fluticasone (FLONASE) 50 MCG/ACT nasal spray Place 2 sprays into both nostrils daily.  16 g  6  . guaiFENesin (MUCINEX) 600 MG 12 hr tablet Take by mouth 2 (two) times daily.      Marland Kitchen loratadine (CLARITIN) 10 MG tablet Take 1 tablet (10 mg total) by mouth daily.  30 tablet  11  . oxyCODONE (OXY IR/ROXICODONE) 5 MG immediate release tablet Take 1-2 tablets (5-10 mg total) by mouth every 6 (six) hours as needed for severe pain.  30 tablet  0  . predniSONE (DELTASONE) 20 MG tablet Take two tablets daily.      Marland Kitchen  sulfamethoxazole-trimethoprim (BACTRIM DS) 800-160 MG per tablet Take 1 tablet by mouth 2 (two) times daily.  28 tablet  0   No current facility-administered medications for this visit.     Allergies:  Allergies  Allergen Reactions  . Amoxicillin-Pot Clavulanate   . Penicillins     REACTION: RASH    Past Medical History, Surgical history, Social history, and Family History were reviewed and updated.    Physical Exam: Blood pressure 126/86, pulse 75, temperature 98.6 F (37 C), temperature source Oral, resp. rate 18, height $RemoveBe'5\' 6"'jtNIBtkvy$  (1.676 m), weight 168 lb 8 oz (76.431 kg), SpO2 100.00%. ECOG: 0 General appearance: alert and cooperative not in any distress. Head: Normocephalic, no masses or lesions. Neck: no adenopathy, no thyroid masses. Lymph nodes: Cervical, supraclavicular, and axillary nodes normal. Heart:regular rate and rhythm, S1, S2 normal, no murmur, click, rub or gallop Lung:bilateral basilar rales heard. No wheezing or stridor.  Abdomen: soft, non-tender, without masses or organomegaly EXT:no erythema, induration, or nodules Skin: Diffuse acneiform rash on the face chest and back have decreased in intensity without any erythema or induration. No evidence of superinfection Skin: Evidence of active paronychia affecting the great toes bilaterally, left greater than right. No active oozing purulent material however there is erythema and fluctuance with a significant tenderness more laterally then medially around both the great toenails as well as the top of the toe.   Grade 1-2 acneform skin eruptions primarily affecting the face, no evidence of superinfection  Lab Results: Lab Results  Component Value Date   WBC 6.5 06/03/2014   HGB 15.5 06/03/2014   HCT 45.5 06/03/2014   MCV 87.9 06/03/2014   PLT 254 06/03/2014     Chemistry      Component Value Date/Time   NA 143 06/03/2014 0825   NA 141 11/18/2013 0457   K 3.3* 06/03/2014 0825   K 3.4* 11/18/2013 0457   CL 104  11/18/2013 0457   CO2 25 06/03/2014 0825   CO2 22 11/18/2013 0457   BUN 9.4 06/03/2014 0825   BUN 15 11/18/2013 0457   CREATININE 0.8 06/03/2014 0825   CREATININE 0.82 11/18/2013 0457   CREATININE 0.69 08/16/2013 1727      Component Value Date/Time   CALCIUM 9.2 06/03/2014 0825   CALCIUM 9.1 11/18/2013 0457   ALKPHOS 77 06/03/2014 0825   ALKPHOS 124* 11/16/2013 0630   AST 17 06/03/2014 0825   AST 18 11/16/2013 0630   ALT 22 06/03/2014 0825   ALT 17 11/16/2013 0630   BILITOT 0.98 06/03/2014 0825   BILITOT 0.6 11/16/2013 0630     Impression and Plan:   43 year old gentleman with the following issues:  1. Stage IV adenocarcinoma of the lung with a left lung mass and bilateral pulmonary nodules. His tumor is EGFR mutated but the ALK mutation was not detected. We plan on continuing this medication with the same dose and schedule and repeat CT scan in October 2015 which I  have requested today.  2. Cough and shortness of breath: This is related to #1, continues to improve   3.  Acneiform rash: Appears to be improving today. I have instructed him to continue prednisone to 10 mg a day as well as continue with the doxycycline and moisturizer cream's.He will continue on prednisone at 10 mg by mouth daily until he returns for another symptom management visit in 4 weeks.  4. Paronychia-likely related to Tarceva therapy. I have encouraged him to soak his fingers and toes at least twice a day. I have prescribed Bactrim twice a day for 14 days since he thinks this helped him more than the Keflex.  5. Follow up:  In approximately one month after his CT scan.  Mikey Bussing, DNP, AGPCNP-BC 9/25/201512:55 PM

## 2014-06-06 ENCOUNTER — Ambulatory Visit: Payer: No Typology Code available for payment source | Admitting: Oncology

## 2014-06-16 ENCOUNTER — Telehealth: Payer: Self-pay | Admitting: Medical Oncology

## 2014-06-16 ENCOUNTER — Other Ambulatory Visit: Payer: Self-pay | Admitting: Medical Oncology

## 2014-06-16 MED ORDER — DOXYCYCLINE HYCLATE 100 MG PO CAPS: 100.0000 mg | ORAL_CAPSULE | Freq: Two times a day (BID) | ORAL | Status: DC

## 2014-06-16 MED ORDER — SILVER NITRATE-POT NITRATE 75-25 % EX MISC: CUTANEOUS | Status: DC

## 2014-06-16 NOTE — Telephone Encounter (Signed)
Patient called to report that he has seen no improvement to skin on Right ring finger and great toe. States pain continues and bleeds whenever he touches it or accidentally bumps it. States he has used the medication prescribed and will be done with it today, states the medication has not worked, Bactrim,  and wants to know what to do.  Reports he has been soaking the affected areas as directed.   Patient states needs a refill for Doxycycline as well to be sent to his pharmacy, [facility].   Informed patient will discuss with PA and call with instructions.  LOV 09/25 Pt with MD f/u 10/22

## 2014-06-16 NOTE — Telephone Encounter (Signed)
Review with PA, prescription for Silver Nitrate ordered as instructed by PA. Educated patient on the possibility that he may feel some stinging or burning on application as well as may have some discoloration as well as how to apply. Patient expressed verbal understanding, knows to contact office regarding any further questions/concerns.  Per PA, prescription refill for Doxycycline also refilled. Patient aware.  Patient has appt with MD 10/22, will be able to evaluate effectiveness of Silver Nitrate at that time.   Mssg routed to MD.

## 2014-06-17 ENCOUNTER — Other Ambulatory Visit: Payer: Self-pay

## 2014-06-17 MED ORDER — SILVER NITRATE-POT NITRATE 75-25 % EX MISC: CUTANEOUS | Status: DC

## 2014-06-17 NOTE — Telephone Encounter (Signed)
[facility] called and was informed Silver Nitrate was not available at there pharmacy, they spoke with the pt who would like it sent to [facility] on [address]. Order sent to [place].

## 2014-06-28 ENCOUNTER — Other Ambulatory Visit: Payer: Self-pay | Admitting: Internal Medicine

## 2014-06-28 ENCOUNTER — Encounter (HOSPITAL_COMMUNITY): Payer: Self-pay

## 2014-06-28 ENCOUNTER — Ambulatory Visit (HOSPITAL_COMMUNITY)
Admission: RE | Admit: 2014-06-28 | Discharge: 2014-06-28 | Disposition: A | Discharge status: Home / Self Care | Payer: Self-pay | Source: Ambulatory Visit | Attending: Oncology | Admitting: Oncology

## 2014-06-28 ENCOUNTER — Other Ambulatory Visit (HOSPITAL_BASED_OUTPATIENT_CLINIC_OR_DEPARTMENT_OTHER): Payer: Self-pay

## 2014-06-28 DIAGNOSIS — C799 Secondary malignant neoplasm of unspecified site: Secondary | ICD-10-CM | POA: Insufficient documentation

## 2014-06-28 DIAGNOSIS — C3412 Malignant neoplasm of upper lobe, left bronchus or lung: Secondary | ICD-10-CM | POA: Insufficient documentation

## 2014-06-28 DIAGNOSIS — C801 Malignant (primary) neoplasm, unspecified: Secondary | ICD-10-CM

## 2014-06-28 LAB — CBC WITH DIFFERENTIAL/PLATELET
BASO%: 0.7 % (ref 0.0–2.0)
BASOS ABS: 0.1 10*3/uL (ref 0.0–0.1)
EOS ABS: 0.1 10*3/uL (ref 0.0–0.5)
EOS%: 1.9 % (ref 0.0–7.0)
HEMATOCRIT: 46.3 % (ref 38.4–49.9)
HGB: 15.4 g/dL (ref 13.0–17.1)
LYMPH%: 18.8 % (ref 14.0–49.0)
MCH: 29.8 pg (ref 27.2–33.4)
MCHC: 33.3 g/dL (ref 32.0–36.0)
MCV: 89.3 fL (ref 79.3–98.0)
MONO#: 0.6 10*3/uL (ref 0.1–0.9)
MONO%: 8.1 % (ref 0.0–14.0)
NEUT%: 70.5 % (ref 39.0–75.0)
NEUTROS ABS: 5.2 10*3/uL (ref 1.5–6.5)
PLATELETS: 253 10*3/uL (ref 140–400)
RBC: 5.19 10*6/uL (ref 4.20–5.82)
RDW: 13 % (ref 11.0–14.6)
WBC: 7.4 10*3/uL (ref 4.0–10.3)
lymph#: 1.4 10*3/uL (ref 0.9–3.3)

## 2014-06-28 LAB — COMPREHENSIVE METABOLIC PANEL (CC13)
ALK PHOS: 67 U/L (ref 40–150)
ALT: 26 U/L (ref 0–55)
ANION GAP: 8 meq/L (ref 3–11)
AST: 17 U/L (ref 5–34)
Albumin: 3.6 g/dL (ref 3.5–5.0)
BILIRUBIN TOTAL: 0.91 mg/dL (ref 0.20–1.20)
BUN: 11.8 mg/dL (ref 7.0–26.0)
CO2: 24 meq/L (ref 22–29)
CREATININE: 0.8 mg/dL (ref 0.7–1.3)
Calcium: 9 mg/dL (ref 8.4–10.4)
Chloride: 110 mEq/L — ABNORMAL HIGH (ref 98–109)
GLUCOSE: 78 mg/dL (ref 70–140)
Potassium: 3.3 mEq/L — ABNORMAL LOW (ref 3.5–5.1)
Sodium: 142 mEq/L (ref 136–145)
Total Protein: 5.9 g/dL — ABNORMAL LOW (ref 6.4–8.3)

## 2014-06-28 MED ORDER — IOHEXOL 300 MG/ML  SOLN
100.0000 mL | Freq: Once | INTRAMUSCULAR | Status: AC | PRN
  Administered 2014-06-28: 100 mL via INTRAVENOUS

## 2014-06-30 ENCOUNTER — Other Ambulatory Visit: Payer: Self-pay | Admitting: Emergency Medicine

## 2014-06-30 ENCOUNTER — Ambulatory Visit (HOSPITAL_BASED_OUTPATIENT_CLINIC_OR_DEPARTMENT_OTHER): Payer: Self-pay | Admitting: Oncology

## 2014-06-30 ENCOUNTER — Encounter: Payer: Self-pay | Admitting: Oncology

## 2014-06-30 VITALS — BP 131/78 | HR 72 | Temp 98.2°F | Resp 18 | Wt 169.6 lb

## 2014-06-30 DIAGNOSIS — C3412 Malignant neoplasm of upper lobe, left bronchus or lung: Secondary | ICD-10-CM

## 2014-06-30 MED ORDER — ALBUTEROL SULFATE HFA 108 (90 BASE) MCG/ACT IN AERS: 2.0000 | INHALATION_SPRAY | Freq: Four times a day (QID) | RESPIRATORY_TRACT | Status: DC | PRN

## 2014-06-30 NOTE — Progress Notes (Signed)
Hematology and Oncology Follow Up Visit  Chad George [id number] 1971-03-12 43 y.o. 06/30/2014 4:43 PM George, Chad Dare, MDJegede, Chad Clipper, MD   Principle Diagnosis: 43 year old gentleman with stage IV adenocarcinoma of the lung diagnosed in March of 2015. He presented with left upper lobe lung mass measuring 5.4 x 4.7 x 3.7 cm and bilateral pulmonary nodules. Pathology indicating adenocarcinoma of a lung primary. 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   Current therapy: Tarceva 150 mg daily started in March of 2015. Placed on hold 12/14/13 due to rash. This was resumed again on 12/24/2013.   Interim History:  [name] presents today for routine followup. Since the last visit, he reports no new symptoms. He continues to have issues with paronychia involving both great toes and his thumbs but this has improved. The redness and pain has improved. He remains on doxycycline twice a day. He denies fever or chills. He continues to tolerate his Tarceva well with the exception of occasional episodes of diarrhea are well managed with Imodium as well as a grade 1-2 acneform skin rash. He is currently on a prednisone taper, currently down to 10 mg by mouth daily. He is still using moisturizer and Hydrocortisone routinely. He continues to have improvement in his energy and performance status.  He has not reported any fevers or chills or sweats. He has not reported any difficulty breathing or stridor. He has not reported any difficulty swallowing or dehydration. He continues to have a cough but overall it is much improved. He is sleeping better. He is not reporting any headaches or blurry vision does not report any alteration of mental status psychiatric issues of depression. Is not reporting any chest pain or palpitation. Does not report any nausea or vomiting abdominal pain. Has not reported any hematochezia or melena. Has not reported any frequency urgency or hesitancy. Does  not report any lymphadenopathy or petechiae. He does not report any skeletal pain.he does report paronychia affecting his toes and fingernails.  Remainder of his review of system is unremarkable.  Medications: I have reviewed the patient's current medications.  Current Outpatient Prescriptions  Medication Sig Dispense Refill  . acetaminophen (TYLENOL) 325 MG tablet Take 650 mg by mouth every 6 (six) hours as needed.      Marland Kitchen albuterol (PROVENTIL HFA;VENTOLIN HFA) 108 (90 BASE) MCG/ACT inhaler Inhale 2 puffs into the lungs every 6 (six) hours as needed for wheezing or shortness of breath.  1 Inhaler  1  . benzonatate (TESSALON) 100 MG capsule Take 1 to 2 capsules by mouth every 8 hours as needed for cough  45 capsule  2  . chlorpheniramine-HYDROcodone (TUSSIONEX PENNKINETIC ER) 10-8 MG/5ML LQCR Take 5 mLs by mouth every 12 (twelve) hours as needed for cough.  1 Bottle  0  . doxycycline (VIBRAMYCIN) 100 MG capsule Take 1 capsule (100 mg total) by mouth 2 (two) times daily.  60 capsule  1  . erlotinib (TARCEVA) 150 MG tablet Take 1 tablet (150 mg total) by mouth daily. Take on an empty stomach 1 hour before meals or 2 hours after.  30 tablet  0  . fluticasone (FLONASE) 50 MCG/ACT nasal spray Place 2 sprays into both nostrils daily.  16 g  6  . guaiFENesin (MUCINEX) 600 MG 12 hr tablet Take by mouth 2 (two) times daily.      Marland Kitchen loratadine (CLARITIN) 10 MG tablet Take 1 tablet (10 mg total) by mouth daily.  30 tablet  11  . oxyCODONE (OXY IR/ROXICODONE) 5 MG immediate release tablet Take 1-2 tablets (5-10 mg total) by mouth every 6 (six) hours as needed for severe pain.  30 tablet  0  . predniSONE (DELTASONE) 20 MG tablet Take two tablets daily.      . silver nitrate applicators 13-24 % applicator Apply topically 3 (three) times a week. To affected areas. For 2-3 weeks.  100 each  0  . sulfamethoxazole-trimethoprim (BACTRIM DS) 800-160 MG per tablet Take 1 tablet by mouth 2 (two) times daily.  28 tablet  0    No current facility-administered medications for this visit.     Allergies:  Allergies  Allergen Reactions  . Amoxicillin-Pot Clavulanate   . Penicillins     REACTION: RASH    Past Medical History, Surgical history, Social history, and Family History were reviewed and updated.    Physical Exam: There were no vitals taken for this visit. ECOG: 0 General appearance: alert and cooperative not in any distress. Head: Normocephalic, no masses or lesions. Neck: no adenopathy, no thyroid masses. Lymph nodes: Cervical, supraclavicular, and axillary nodes normal. Heart:regular rate and rhythm, S1, S2 normal, no murmur, click, rub or gallop Lung:bilateral basilar rales heard. No wheezing or stridor.  Abdomen: soft, non-tender, without masses or organomegaly EXT:no erythema, induration, or nodules Skin: Diffuse acneiform rash on the face chest and back have decreased in intensity without any erythema or induration. No evidence of  Slight erythema noted at the tips of his fingers.  Grade 1acneform skin eruptions primarily affecting the face, no evidence of superinfection  Lab Results: Lab Results  Component Value Date   WBC 7.4 06/28/2014   HGB 15.4 06/28/2014   HCT 46.3 06/28/2014   MCV 89.3 06/28/2014   PLT 253 06/28/2014     Chemistry      Component Value Date/Time   NA 142 06/28/2014 1205   NA 141 11/18/2013 0457   K 3.3* 06/28/2014 1205   K 3.4* 11/18/2013 0457   CL 104 11/18/2013 0457   CO2 24 06/28/2014 1205   CO2 22 11/18/2013 0457   BUN 11.8 06/28/2014 1205   BUN 15 11/18/2013 0457   CREATININE 0.8 06/28/2014 1205   CREATININE 0.82 11/18/2013 0457   CREATININE 0.69 08/16/2013 1727      Component Value Date/Time   CALCIUM 9.0 06/28/2014 1205   CALCIUM 9.1 11/18/2013 0457   ALKPHOS 67 06/28/2014 1205   ALKPHOS 124* 11/16/2013 0630   AST 17 06/28/2014 1205   AST 18 11/16/2013 0630   ALT 26 06/28/2014 1205   ALT 17 11/16/2013 0630   BILITOT 0.91 06/28/2014 1205    BILITOT 0.6 11/16/2013 0630      chemotherapy. Restaging.  EXAM:  CT CHEST, ABDOMEN, AND PELVIS WITH CONTRAST  TECHNIQUE:  Multidetector CT imaging of the chest, abdomen and pelvis was  performed following the standard protocol during bolus  administration of intravenous contrast.  CONTRAST: 144m OMNIPAQUE IOHEXOL 300 MG/ML SOLN  COMPARISON: Chest CT 04/04/2014 and 11/15/2013  FINDINGS:  CT CHEST FINDINGS  Chest wall: Small scattered supraclavicular and axillary lymph nodes  appears stable. No adenopathy. The thyroid gland appears normal. The  bony thorax is intact. No destructive bone lesions or spinal canal  compromise. Stable sclerotic osseous metastatic lesions in the  spine, ribs and sternum. No new lesions.  Mediastinum: The heart is normal in size. No pericardial effusion.  There are scattered stable mediastinal and hilar lymph nodes the  prevascular node on image  number 20 measures 7 mm and is unchanged.  A 5 mm pretracheal node is also unchanged. No new lesions. Small  hilar nodes are stable.  Lungs: The large left upper lobe lung mass has completely resolved.  There is residual scarring and atelectasis but no measurable tumor.  The diffuse pulmonary nodules identified on 11/15/2013 are not  visualized. No measurable residual pulmonary metastatic disease. No  pleural effusion.  CT ABDOMEN AND PELVIS FINDINGS  Stable low-attenuation lesion in the medial segment of the left  hepatic lobe on image number 53. No new liver lesions to suggest  metastatic disease. The gallbladder is normal. No common bowel duct  dilatation. The pancreas is normal. The spleen is normal. There is a  stable low-attenuation splenic lesion which measures as a simple  cyst.  The adrenal glands and kidneys are unremarkable except for small  left renal calculi.  The stomach, duodenum, small bowel and colon are unremarkable. The  appendix is normal. No mesenteric or retroperitoneal mass or   adenopathy. The aorta is normal in caliber. The major branch vessels  are patent. The major venous structures are patent.  The bladder, prostate gland and seminal vesicles are unremarkable.  No pelvic mass, adenopathy or free pelvic fluid collections. No  inguinal mass or adenopathy.  Stable sclerotic osseous metastatic lesions involving the lumbar  spine and pelvis.  IMPRESSION:  1. No findings for residual or recurrent tumor in the left upper  lobe. No recurrent metastatic pulmonary nodules. Stable small  scattered mediastinal and hilar lymph nodes.  2. Stable diffuse sclerotic metastatic disease.  3. No findings for metastatic disease involving the abdomen/pelvis.     Impression and Plan:   43 year old gentleman with the following issues:  1. Stage IV adenocarcinoma of the lung with a left lung mass and bilateral pulmonary nodules. His tumor is EGFR mutated but the ALK mutation was not detected. CT scan on 06/28/2014 was discussed today and showed excellent response to therapy.  I plan on keeping Tarceva on the same dose and schedule.   2.  Acneiform rash: Appears to be improving today. I have instructed him to continue prednisone to 10 mg a day as well as continue with the doxycycline and moisturizer cream's.  4. Paronychia-likely related to Tarceva therapy. I have encouraged him to continue with supportive care as he is doing. It seems to have improved.   5. Follow up:  In 4 to 5 weeks.   Zola Button, MD 10/22/20154:43 PM

## 2014-07-01 ENCOUNTER — Telehealth: Payer: Self-pay | Admitting: Oncology

## 2014-07-01 NOTE — Telephone Encounter (Signed)
s.w pt and advised on NOV appt....pt ok adn aware °

## 2014-07-05 ENCOUNTER — Other Ambulatory Visit: Payer: Self-pay | Admitting: Medical Oncology

## 2014-07-05 ENCOUNTER — Telehealth: Payer: Self-pay | Admitting: Medical Oncology

## 2014-07-05 DIAGNOSIS — L03032 Cellulitis of left toe: Secondary | ICD-10-CM

## 2014-07-05 MED ORDER — SULFAMETHOXAZOLE-TMP DS 800-160 MG PO TABS: 1.0000 | ORAL_TABLET | Freq: Two times a day (BID) | ORAL | Status: DC

## 2014-07-05 NOTE — Telephone Encounter (Signed)
Patient called reporting swelling to his left big toe states it is painful and he has noticed "a smell" to it, reports puss comes out when he presses on it. States toe nail is not loose, started yesterday. Reports to have used an OTC hydrocortisone cream and it isn't working. Reviewed with MD, patient to start Augmentin 875 BID for 14 days. Patient informed and expressed understanding. Prescription e-scribed to his pharmacy. Patient knows to call office with further questions/concerns.

## 2014-07-05 NOTE — Telephone Encounter (Signed)
Due to patient allergy to Amoxicillin, per MD, Augmentin changed to Bactrim. Patient to take Bactrim 1 tablet, twice daily for 14 days. Patient informed. Patient states he thinks the allergies listed are a mistake. Reviewed this information with MD, and MD instructions to stay with Bactrim as ordered.

## 2014-07-18 ENCOUNTER — Telehealth: Payer: Self-pay | Admitting: *Deleted

## 2014-07-18 NOTE — Telephone Encounter (Signed)
Patient states he was placed on bactrim-DS for infection on fingers and toenails. Has taken last ill today and has seen no improvement in infection.

## 2014-07-21 ENCOUNTER — Other Ambulatory Visit: Payer: Self-pay | Admitting: Physician Assistant

## 2014-07-21 DIAGNOSIS — C801 Malignant (primary) neoplasm, unspecified: Secondary | ICD-10-CM

## 2014-07-21 DIAGNOSIS — IMO0002 Reserved for concepts with insufficient information to code with codable children: Secondary | ICD-10-CM

## 2014-07-28 ENCOUNTER — Ambulatory Visit (HOSPITAL_BASED_OUTPATIENT_CLINIC_OR_DEPARTMENT_OTHER): Payer: Self-pay

## 2014-07-28 ENCOUNTER — Encounter: Payer: Self-pay | Admitting: Physician Assistant

## 2014-07-28 ENCOUNTER — Other Ambulatory Visit (HOSPITAL_BASED_OUTPATIENT_CLINIC_OR_DEPARTMENT_OTHER): Payer: Self-pay

## 2014-07-28 ENCOUNTER — Telehealth: Payer: Self-pay | Admitting: Physician Assistant

## 2014-07-28 ENCOUNTER — Ambulatory Visit (HOSPITAL_BASED_OUTPATIENT_CLINIC_OR_DEPARTMENT_OTHER): Payer: Self-pay | Admitting: Physician Assistant

## 2014-07-28 VITALS — BP 121/79 | HR 67 | Temp 98.5°F | Resp 18 | Ht 66.0 in | Wt 168.3 lb

## 2014-07-28 DIAGNOSIS — C3400 Malignant neoplasm of unspecified main bronchus: Secondary | ICD-10-CM

## 2014-07-28 DIAGNOSIS — IMO0002 Reserved for concepts with insufficient information to code with codable children: Secondary | ICD-10-CM

## 2014-07-28 DIAGNOSIS — L03019 Cellulitis of unspecified finger: Secondary | ICD-10-CM

## 2014-07-28 DIAGNOSIS — C3412 Malignant neoplasm of upper lobe, left bronchus or lung: Secondary | ICD-10-CM

## 2014-07-28 DIAGNOSIS — R300 Dysuria: Secondary | ICD-10-CM

## 2014-07-28 LAB — COMPREHENSIVE METABOLIC PANEL (CC13)
ALT: 39 U/L (ref 0–55)
ANION GAP: 6 meq/L (ref 3–11)
AST: 26 U/L (ref 5–34)
Albumin: 3.8 g/dL (ref 3.5–5.0)
Alkaline Phosphatase: 86 U/L (ref 40–150)
BUN: 12 mg/dL (ref 7.0–26.0)
CALCIUM: 9 mg/dL (ref 8.4–10.4)
CHLORIDE: 106 meq/L (ref 98–109)
CO2: 27 meq/L (ref 22–29)
Creatinine: 0.8 mg/dL (ref 0.7–1.3)
Glucose: 95 mg/dl (ref 70–140)
Potassium: 3.7 mEq/L (ref 3.5–5.1)
Sodium: 140 mEq/L (ref 136–145)
Total Bilirubin: 1.2 mg/dL (ref 0.20–1.20)
Total Protein: 6.1 g/dL — ABNORMAL LOW (ref 6.4–8.3)

## 2014-07-28 LAB — CBC WITH DIFFERENTIAL/PLATELET
BASO%: 0.2 % (ref 0.0–2.0)
BASOS ABS: 0 10*3/uL (ref 0.0–0.1)
EOS%: 2.5 % (ref 0.0–7.0)
Eosinophils Absolute: 0.1 10*3/uL (ref 0.0–0.5)
HEMATOCRIT: 45.6 % (ref 38.4–49.9)
HGB: 16.1 g/dL (ref 13.0–17.1)
LYMPH#: 1.3 10*3/uL (ref 0.9–3.3)
LYMPH%: 22.9 % (ref 14.0–49.0)
MCH: 30.3 pg (ref 27.2–33.4)
MCHC: 35.3 g/dL (ref 32.0–36.0)
MCV: 85.9 fL (ref 79.3–98.0)
MONO#: 0.5 10*3/uL (ref 0.1–0.9)
MONO%: 8.1 % (ref 0.0–14.0)
NEUT#: 3.7 10*3/uL (ref 1.5–6.5)
NEUT%: 66.3 % (ref 39.0–75.0)
PLATELETS: 198 10*3/uL (ref 140–400)
RBC: 5.31 10*6/uL (ref 4.20–5.82)
RDW: 12.2 % (ref 11.0–14.6)
WBC: 5.6 10*3/uL (ref 4.0–10.3)

## 2014-07-28 LAB — URINALYSIS, MICROSCOPIC - CHCC
BILIRUBIN (URINE): NEGATIVE
Blood: NEGATIVE
GLUCOSE UR CHCC: NEGATIVE mg/dL
KETONES: NEGATIVE mg/dL
LEUKOCYTE ESTERASE: NEGATIVE
Nitrite: NEGATIVE
PROTEIN: NEGATIVE mg/dL
RBC / HPF: NEGATIVE (ref 0–2)
Specific Gravity, Urine: 1.01 (ref 1.003–1.035)
Urobilinogen, UR: 0.2 mg/dL (ref 0.2–1)
pH: 7.5 (ref 4.6–8.0)

## 2014-07-28 MED ORDER — OXYCODONE HCL 5 MG PO TABS
5.0000 mg | ORAL_TABLET | Freq: Four times a day (QID) | ORAL | Status: DC | PRN
Start: 2014-07-28 — End: 2015-06-07

## 2014-07-28 MED ORDER — OXYCODONE HCL 5 MG PO TABS
5.0000 mg | ORAL_TABLET | Freq: Four times a day (QID) | ORAL | Status: DC | PRN
Start: 2014-07-28 — End: 2014-07-28

## 2014-07-28 NOTE — Progress Notes (Signed)
Hematology and Oncology Follow Up Visit  Chad George [id number] Oct 09, 1970 42 y.o. 07/28/2014 2:24 PM Chad George, MDJegede, [name], MD   Principle Diagnosis: 43 year old gentleman with stage IV adenocarcinoma of the lung diagnosed in March of 2015. He presented with left upper lobe lung mass measuring 5.4 x 4.7 x 3.7 cm and bilateral pulmonary nodules. Pathology indicating adenocarcinoma of a lung primary. 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   Current therapy: Tarceva 150 mg daily started in March of 2015. Placed on hold 12/14/13 due to rash. This was resumed again on 12/24/2013.   Interim History:  Chad George presents today for routine followup. Since the last visit, he reports no new symptoms. He continues to have issues with paronychia involving both great toes and his thumbs but this has improved.  His primarily located on his face. He completed course of prednisone last Friday. He requests a refill for his oxycodone. He does report dysuria for the past 1-2 days. He denied any fever or chills.  He remains on doxycycline twice a day. He denies fever or chills. He continues to tolerate his Tarceva well with the exception of occasional episodes of diarrhea are well managed with Imodium as well as a grade 1-2 acneform skin rash.  He is still using moisturizer and Hydrocortisone routinely. He continues to have improvement in his energy and performance status.  He has not reported any fevers or chills or sweats. He has not reported any difficulty breathing or stridor. He has not reported any difficulty swallowing or dehydration. He continues to have a cough but overall it is much improved. He is sleeping better. He is not reporting any headaches or blurry vision does not report any alteration of mental status psychiatric issues of depression. Is not reporting any chest pain or palpitation. Does not report any nausea or vomiting abdominal pain. Has not  reported any hematochezia or melena. Has not reported any frequency urgency or hesitancy. Does not report any lymphadenopathy or petechiae. He does not report any skeletal pain.he does report paronychia affecting his toes and fingernails.  Remainder of his review of system is unremarkable.  Medications: I have reviewed the patient's current medications.  Current Outpatient Prescriptions  Medication Sig Dispense Refill  . acetaminophen (TYLENOL) 325 MG tablet Take 650 mg by mouth every 6 (six) hours as needed.    Marland Kitchen albuterol (PROVENTIL HFA;VENTOLIN HFA) 108 (90 BASE) MCG/ACT inhaler Inhale 2 puffs into the lungs every 6 (six) hours as needed for wheezing or shortness of breath. 1 Inhaler 1  . benzonatate (TESSALON) 100 MG capsule Take 1 to 2 capsules by mouth every 8 hours as needed for cough 45 capsule 2  . chlorpheniramine-HYDROcodone (TUSSIONEX PENNKINETIC ER) 10-8 MG/5ML LQCR Take 5 mLs by mouth every 12 (twelve) hours as needed for cough. 1 Bottle 0  . doxycycline (VIBRAMYCIN) 100 MG capsule Take 1 capsule (100 mg total) by mouth 2 (two) times daily. 60 capsule 1  . erlotinib (TARCEVA) 150 MG tablet Take 1 tablet (150 mg total) by mouth daily. Take on an empty stomach 1 hour before meals or 2 hours after. 30 tablet 0  . fluticasone (FLONASE) 50 MCG/ACT nasal spray Place 2 sprays into both nostrils daily. 16 g 6  . guaiFENesin (MUCINEX) 600 MG 12 hr tablet Take by mouth 2 (two) times daily.    Marland Kitchen loratadine (CLARITIN) 10 MG tablet Take 1 tablet (10 mg total) by mouth daily. 30 tablet 11  .  oxyCODONE (OXY IR/ROXICODONE) 5 MG immediate release tablet Take 1-2 tablets (5-10 mg total) by mouth every 6 (six) hours as needed for severe pain. 30 tablet 0  . predniSONE (DELTASONE) 20 MG tablet Take two tablets daily.    . silver nitrate applicators 56-21 % applicator Apply topically 3 (three) times a week. To affected areas. For 2-3 weeks. 100 each 0  . sulfamethoxazole-trimethoprim (BACTRIM DS) 800-160  MG per tablet Take 1 tablet by mouth 2 (two) times daily. 28 tablet 0   No current facility-administered medications for this visit.     Allergies:  Allergies  Allergen Reactions  . Amoxicillin-Pot Clavulanate   . Penicillins     REACTION: RASH    Past Medical History, Surgical history, Social history, and Family History were reviewed and updated.    Physical Exam: Blood pressure 121/79, pulse 67, temperature 98.5 F (36.9 C), temperature source Oral, resp. rate 18, height _0  (1.676 m), weight 168 lb 4.8 oz (76.34 kg), SpO2 99 %. ECOG: 0 General appearance: alert and cooperative not in any distress. Head: Normocephalic, no masses or lesions. Neck: no adenopathy, no thyroid masses. Lymph nodes: Cervical, supraclavicular, and axillary nodes normal. Heart:regular rate and rhythm, S1, S2 normal, no murmur, click, rub or gallop Lung:bilateral basilar rales heard. No wheezing or stridor.  Abdomen: soft, non-tender, without masses or organomegaly EXT:no erythema, induration, or nodules Skin: Diffuse acneiform rash on the face chest and back have decreased in intensity without any erythema or induration. No evidence of  Slight erythema noted at the tips of his fingers.  Grade 1 -2 acneform skin eruptions primarily affecting the face, no evidence of superinfection. Paronychia involving the fingers and toes, not currently inflamed no drainage noted  Lab Results: Lab Results  Component Value Date   WBC 5.6 07/28/2014   HGB 16.1 07/28/2014   HCT 45.6 07/28/2014   MCV 85.9 07/28/2014   PLT 198 07/28/2014     Chemistry      Component Value Date/Time   NA 140 07/28/2014 0949   NA 141 11/18/2013 0457   K 3.7 07/28/2014 0949   K 3.4* 11/18/2013 0457   CL 104 11/18/2013 0457   CO2 27 07/28/2014 0949   CO2 22 11/18/2013 0457   BUN 12.0 07/28/2014 0949   BUN 15 11/18/2013 0457   CREATININE 0.8 07/28/2014 0949   CREATININE 0.82 11/18/2013 0457   CREATININE 0.69 08/16/2013 1727       Component Value Date/Time   CALCIUM 9.0 07/28/2014 0949   CALCIUM 9.1 11/18/2013 0457   ALKPHOS 86 07/28/2014 0949   ALKPHOS 124* 11/16/2013 0630   AST 26 07/28/2014 0949   AST 18 11/16/2013 0630   ALT 39 07/28/2014 0949   ALT 17 11/16/2013 0630   BILITOT 1.20 07/28/2014 0949   BILITOT 0.6 11/16/2013 0630      chemotherapy. Restaging.  EXAM:  CT CHEST, ABDOMEN, AND PELVIS WITH CONTRAST  TECHNIQUE:  Multidetector CT imaging of the chest, abdomen and pelvis was  performed following the standard protocol during bolus  administration of intravenous contrast.  CONTRAST: 166m OMNIPAQUE IOHEXOL 300 MG/ML SOLN  COMPARISON: Chest CT 04/04/2014 and 11/15/2013  FINDINGS:  CT CHEST FINDINGS  Chest wall: Small scattered supraclavicular and axillary lymph nodes  appears stable. No adenopathy. The thyroid gland appears normal. The  bony thorax is intact. No destructive bone lesions or spinal canal  compromise. Stable sclerotic osseous metastatic lesions in the  spine, ribs and sternum. No new lesions.  Mediastinum: The heart is normal in size. No pericardial effusion.  There are scattered stable mediastinal and hilar lymph nodes the  prevascular node on image number 20 measures 7 mm and is unchanged.  A 5 mm pretracheal node is also unchanged. No new lesions. Small  hilar nodes are stable.  Lungs: The large left upper lobe lung mass has completely resolved.  There is residual scarring and atelectasis but no measurable tumor.  The diffuse pulmonary nodules identified on 11/15/2013 are not  visualized. No measurable residual pulmonary metastatic disease. No  pleural effusion.  CT ABDOMEN AND PELVIS FINDINGS  Stable low-attenuation lesion in the medial segment of the left  hepatic lobe on image number 53. No new liver lesions to suggest  metastatic disease. The gallbladder is normal. No common bowel duct  dilatation. The pancreas is normal. The spleen is normal. There is a  stable  low-attenuation splenic lesion which measures as a simple  cyst.  The adrenal glands and kidneys are unremarkable except for small  left renal calculi.  The stomach, duodenum, small bowel and colon are unremarkable. The  appendix is normal. No mesenteric or retroperitoneal mass or  adenopathy. The aorta is normal in caliber. The major branch vessels  are patent. The major venous structures are patent.  The bladder, prostate gland and seminal vesicles are unremarkable.  No pelvic mass, adenopathy or free pelvic fluid collections. No  inguinal mass or adenopathy.  Stable sclerotic osseous metastatic lesions involving the lumbar  spine and pelvis.  IMPRESSION:  1. No findings for residual or recurrent tumor in the left upper  lobe. No recurrent metastatic pulmonary nodules. Stable small  scattered mediastinal and hilar lymph nodes.  2. Stable diffuse sclerotic metastatic disease.  3. No findings for metastatic disease involving the abdomen/pelvis.     Impression and Plan:   43 year old gentleman with the following issues:  1. Stage IV adenocarcinoma of the lung with a left lung mass and bilateral pulmonary nodules. His tumor is EGFR mutated but the ALK mutation was not detected. CT scan on 06/28/2014 was discussed today and showed excellent response to therapy.  I plan on keeping Tarceva on the same dose and schedule.   2.  Acneiform rash: Appears to be improving today. I have instructed him to continue prednisone to 10 mg a day as well as continue with the doxycycline and moisturizer cream's.  4. Paronychia-likely related to Tarceva therapy. He is to continue the supportive careoutlined. We'll also refer him to dermatology for further evaluation and management.   5. Dysuria: We will obtain a clean catch urinalysis and urine culture and sensitivity. Shouldn't infection be present appropriate antibiotic therapy will be instituted.  5. Follow up:  In 4 to 5 weeks.   Awilda Metro  E, PA-C  11/19/20152:24 PM

## 2014-07-28 NOTE — Telephone Encounter (Signed)
Pt confirmed labs/ov per 11/19 POF, gave pt AVS.... KJ called about referral per Abigail Butts they only accept orange cards through [facility] for [place] will have to request the referral through her office. Sent pt with the referral information to contact Stacy to get set up with Dr. Allyson Sabal...Marland KitchenMarland Kitchen

## 2014-07-29 LAB — URINE CULTURE

## 2014-07-31 NOTE — Patient Instructions (Signed)
You're being referred to a dermatologist regarding the paronychia involving her fingernails and toenails Continue Tarceva at the current dose Follow-up in one month

## 2014-08-18 ENCOUNTER — Other Ambulatory Visit: Payer: Self-pay | Admitting: Medical Oncology

## 2014-08-18 MED ORDER — DOXYCYCLINE HYCLATE 100 MG PO CAPS
100.0000 mg | ORAL_CAPSULE | Freq: Two times a day (BID) | ORAL | Status: DC
Start: 2014-08-18 — End: 2014-10-28

## 2014-08-18 NOTE — Telephone Encounter (Signed)
Patient called requesting refill for Doxycycline to be sent to his listed pharmacy. Per MD, ok to refill.  Patient also asking about cold symptoms he's been having; headache, runny nose, NP cough. Denies fever, sore throat or sputum. Informed patient ok for him to take OTC cough and cold medicine. Patient knows to call office should symptoms worsen or with further questions/concerns. Denies further questions at this time.

## 2014-08-26 ENCOUNTER — Ambulatory Visit: Payer: Self-pay | Attending: Internal Medicine

## 2014-08-30 ENCOUNTER — Ambulatory Visit (HOSPITAL_BASED_OUTPATIENT_CLINIC_OR_DEPARTMENT_OTHER): Payer: Self-pay | Admitting: Oncology

## 2014-08-30 ENCOUNTER — Telehealth: Payer: Self-pay | Admitting: Oncology

## 2014-08-30 ENCOUNTER — Ambulatory Visit (HOSPITAL_BASED_OUTPATIENT_CLINIC_OR_DEPARTMENT_OTHER): Payer: Self-pay | Admitting: Lab

## 2014-08-30 VITALS — BP 128/79 | HR 69 | Temp 98.4°F | Resp 18 | Ht 66.0 in | Wt 166.9 lb

## 2014-08-30 DIAGNOSIS — C3400 Malignant neoplasm of unspecified main bronchus: Secondary | ICD-10-CM

## 2014-08-30 DIAGNOSIS — C801 Malignant (primary) neoplasm, unspecified: Secondary | ICD-10-CM

## 2014-08-30 DIAGNOSIS — C3412 Malignant neoplasm of upper lobe, left bronchus or lung: Secondary | ICD-10-CM

## 2014-08-30 LAB — CBC WITH DIFFERENTIAL/PLATELET
BASO%: 0.7 % (ref 0.0–2.0)
Basophils Absolute: 0 10*3/uL (ref 0.0–0.1)
EOS%: 3.9 % (ref 0.0–7.0)
Eosinophils Absolute: 0.3 10*3/uL (ref 0.0–0.5)
HCT: 49.1 % (ref 38.4–49.9)
HGB: 16.7 g/dL (ref 13.0–17.1)
LYMPH%: 20.8 % (ref 14.0–49.0)
MCH: 29.3 pg (ref 27.2–33.4)
MCHC: 34 g/dL (ref 32.0–36.0)
MCV: 86.2 fL (ref 79.3–98.0)
MONO#: 0.5 10*3/uL (ref 0.1–0.9)
MONO%: 8.1 % (ref 0.0–14.0)
NEUT#: 4.4 10*3/uL (ref 1.5–6.5)
NEUT%: 66.5 % (ref 39.0–75.0)
Platelets: 252 10*3/uL (ref 140–400)
RBC: 5.69 10*6/uL (ref 4.20–5.82)
RDW: 12.6 % (ref 11.0–14.6)
WBC: 6.7 10*3/uL (ref 4.0–10.3)
lymph#: 1.4 10*3/uL (ref 0.9–3.3)

## 2014-08-30 LAB — COMPREHENSIVE METABOLIC PANEL (CC13)
ALK PHOS: 105 U/L (ref 40–150)
ALT: 37 U/L (ref 0–55)
AST: 29 U/L (ref 5–34)
Albumin: 3.8 g/dL (ref 3.5–5.0)
Anion Gap: 9 mEq/L (ref 3–11)
BILIRUBIN TOTAL: 1.5 mg/dL — AB (ref 0.20–1.20)
BUN: 12.6 mg/dL (ref 7.0–26.0)
CO2: 26 mEq/L (ref 22–29)
Calcium: 9.1 mg/dL (ref 8.4–10.4)
Chloride: 105 mEq/L (ref 98–109)
Creatinine: 0.8 mg/dL (ref 0.7–1.3)
EGFR: >90 mL/min/{1.73_m2} (ref >90)
Glucose: 102 mg/dl (ref 70–140)
Potassium: 3.5 mEq/L (ref 3.5–5.1)
Sodium: 140 mEq/L (ref 136–145)
Total Protein: 6.2 g/dL — ABNORMAL LOW (ref 6.4–8.3)

## 2014-08-30 NOTE — Telephone Encounter (Signed)
gv adn printed appt sched and avs for pt for Jan 2016.....gv pt barium

## 2014-08-30 NOTE — Progress Notes (Signed)
Hematology and Oncology Follow Up Visit  Chad George [id number] 04/21/1971 43 y.o. 08/30/2014 8:35 AM Chad George, MDJegede, George E, MD   Principle Diagnosis: 43-year-old gentleman with stage IV adenocarcinoma of the lung diagnosed in March of 2015. He presented with left upper lobe lung mass measuring 5.4 x 4.7 x 3.7 cm and bilateral pulmonary nodules. Pathology indicating adenocarcinoma of a lung primary. 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   Current therapy: Tarceva 150 mg daily started in March of 2015. Placed on hold 12/14/13 due to rash. This was resumed again on 12/24/2013.   Interim History:  [name] presents today for routine followup. Since the last visit, he is doing well. He continues to have issues with paronychia involving both great toes and his thumbs but this has improved. He is able to function properly and performs activities of daily living. He is able to drive without any issues. He is hindered at times performing fine motor activities. He denied any fever or chills.   He continues to tolerate his Tarceva well with the exception of occasional episodes of diarrhea are well managed with Imodium.  He has not reported any difficulty breathing or stridor. He has not reported any difficulty swallowing or dehydration.   He is sleeping better. He is not reporting any headaches or blurry vision does not report any alteration of mental status psychiatric issues of depression. Is not reporting any chest pain or palpitation. Does not report any nausea or vomiting abdominal pain. Has not reported any hematochezia or melena. Has not reported any frequency urgency or hesitancy. Does not report any lymphadenopathy or petechiae. He does not report any skeletal pain.he does report paronychia affecting his toes and fingernails.  Remainder of his review of system is unremarkable.  Medications: I have reviewed the patient's current medications.   Current Outpatient Prescriptions  Medication Sig Dispense Refill  . acetaminophen (TYLENOL) 325 MG tablet Take 650 mg by mouth every 6 (six) hours as needed.    . albuterol (PROVENTIL HFA;VENTOLIN HFA) 108 (90 BASE) MCG/ACT inhaler Inhale 2 puffs into the lungs every 6 (six) hours as needed for wheezing or shortness of breath. 1 Inhaler 1  . benzonatate (TESSALON) 100 MG capsule Take 1 to 2 capsules by mouth every 8 hours as needed for cough 45 capsule 2  . chlorpheniramine-HYDROcodone (TUSSIONEX PENNKINETIC ER) 10-8 MG/5ML LQCR Take 5 mLs by mouth every 12 (twelve) hours as needed for cough. 1 Bottle 0  . doxycycline (VIBRAMYCIN) 100 MG capsule Take 1 capsule (100 mg total) by mouth 2 (two) times daily. 60 capsule 1  . erlotinib (TARCEVA) 150 MG tablet Take 1 tablet (150 mg total) by mouth daily. Take on an empty stomach 1 hour before meals or 2 hours after. 30 tablet 0  . fluticasone (FLONASE) 50 MCG/ACT nasal spray Place 2 sprays into both nostrils daily. 16 g 6  . guaiFENesin (MUCINEX) 600 MG 12 hr tablet Take by mouth 2 (two) times daily.    . loratadine (CLARITIN) 10 MG tablet Take 1 tablet (10 mg total) by mouth daily. 30 tablet 11  . oxyCODONE (OXY IR/ROXICODONE) 5 MG immediate release tablet Take 1-2 tablets (5-10 mg total) by mouth every 6 (six) hours as needed for severe pain. 30 tablet 0   No current facility-administered medications for this visit.     Allergies:  Allergies  Allergen Reactions  . Amoxicillin-Pot Clavulanate   . Penicillins     REACTION:   RASH    Past Medical History, Surgical history, Social history, and Family History were reviewed and updated.    Physical Exam: Blood pressure 128/79, pulse 69, temperature 98.4 F (36.9 C), temperature source Oral, resp. rate 18, height 5' 6" (1.676 m), weight 166 lb 14.4 oz (75.705 kg), SpO2 99 %. ECOG: 0 General appearance: alert and cooperative not in any distress. Head: Normocephalic, no masses or lesions. Neck:  no adenopathy, no thyroid masses. Lymph nodes: Cervical, supraclavicular, and axillary nodes normal. Heart:regular rate and rhythm, S1, S2 normal, no murmur, click, rub or gallop Lung:bilateral basilar rales heard. No wheezing or stridor.  Abdomen: soft, non-tender, without masses or organomegaly EXT:no erythema, induration, or nodules Skin: Diffuse acneiform rash on the face chest and back have decreased in intensity without any erythema or induration. No evidence of  Slight erythema noted at the tips of his fingers.  Grade 1  acneform skin eruptions primarily affecting the face, no evidence of superinfection. Paronychia involving the fingers and toes, not  inflamed no drainage noted  Lab Results: Lab Results  Component Value Date   WBC 6.7 08/30/2014   HGB 16.7 08/30/2014   HCT 49.1 08/30/2014   MCV 86.2 08/30/2014   PLT 252 08/30/2014     Chemistry      Component Value Date/Time   NA 140 07/28/2014 0949   NA 141 11/18/2013 0457   K 3.7 07/28/2014 0949   K 3.4* 11/18/2013 0457   CL 104 11/18/2013 0457   CO2 27 07/28/2014 0949   CO2 22 11/18/2013 0457   BUN 12.0 07/28/2014 0949   BUN 15 11/18/2013 0457   CREATININE 0.8 07/28/2014 0949   CREATININE 0.82 11/18/2013 0457   CREATININE 0.69 08/16/2013 1727      Component Value Date/Time   CALCIUM 9.0 07/28/2014 0949   CALCIUM 9.1 11/18/2013 0457   ALKPHOS 86 07/28/2014 0949   ALKPHOS 124* 11/16/2013 0630   AST 26 07/28/2014 0949   AST 18 11/16/2013 0630   ALT 39 07/28/2014 0949   ALT 17 11/16/2013 0630   BILITOT 1.20 07/28/2014 0949   BILITOT 0.6 11/16/2013 0630        Impression and Plan:   43 year-old gentleman with the following issues:  1. Stage IV adenocarcinoma of the lung with a left lung mass and bilateral pulmonary nodules. His tumor is EGFR mutated but the ALK mutation was not detected. CT scan on 06/28/2014 showed excellent response to therapy.  I plan on keeping Tarceva on the same dose and schedule. I  will repeat his CT scan in January 2016.  2.  Acneiform rash: Appears to be improving today. He is currently off prednisone, doxycycline but still using the moisturizer cream spread  4. Paronychia-likely related to Tarceva therapy. He is to continue the supportive measures. He is able to manage well without any need for any dose reduction.  5. Dysuria: This have resolved at this time.  5. Follow up:  In 4 to 5 weeks.   [facility], MD 12/22/20158:35 AM

## 2014-09-05 ENCOUNTER — Telehealth: Payer: Self-pay | Admitting: *Deleted

## 2014-09-05 NOTE — Telephone Encounter (Signed)
Voicemail message from patient requesting [place] to call dermatologist to schedule an appointment.  "Dr. Alen Blew made Dermatology referral but I do not have insurance and can't schedule without an orange card.  I have the orange card.  The dermatologist needs [facility] to call and make the appointment.  Will not allow me to schedule the appointment."  called [phone], left message requesting return call with name of dermatologist.  Awaiting return call to notify scheduling.

## 2014-09-06 NOTE — Telephone Encounter (Signed)
Spoke with collaborative nurse for Dr. Alen Blew.  Patient did call today.  Collaborative nurse tried to call Dr. Ledell Peoples office who does not accept Medicaid.  Collaborative tried a second Dermatology office with no results except instructions to have patient's PCP contact a Dermatologist for this referral as [facility] does not have Dermatolgist to accept his "orange Card".  This nurse with no further action with this event.

## 2014-09-15 ENCOUNTER — Ambulatory Visit: Payer: Self-pay | Attending: Internal Medicine | Admitting: Internal Medicine

## 2014-09-15 ENCOUNTER — Encounter: Payer: Self-pay | Admitting: Internal Medicine

## 2014-09-15 VITALS — BP 116/77 | HR 79 | Temp 98.0°F | Resp 16 | Wt 165.0 lb

## 2014-09-15 DIAGNOSIS — L03032 Cellulitis of left toe: Secondary | ICD-10-CM | POA: Insufficient documentation

## 2014-09-15 DIAGNOSIS — R21 Rash and other nonspecific skin eruption: Secondary | ICD-10-CM | POA: Insufficient documentation

## 2014-09-15 DIAGNOSIS — L03012 Cellulitis of left finger: Secondary | ICD-10-CM | POA: Insufficient documentation

## 2014-09-15 DIAGNOSIS — T451X5A Adverse effect of antineoplastic and immunosuppressive drugs, initial encounter: Secondary | ICD-10-CM | POA: Insufficient documentation

## 2014-09-15 DIAGNOSIS — E780 Pure hypercholesterolemia: Secondary | ICD-10-CM | POA: Insufficient documentation

## 2014-09-15 DIAGNOSIS — Z792 Long term (current) use of antibiotics: Secondary | ICD-10-CM | POA: Insufficient documentation

## 2014-09-15 DIAGNOSIS — J208 Acute bronchitis due to other specified organisms: Secondary | ICD-10-CM | POA: Insufficient documentation

## 2014-09-15 DIAGNOSIS — L03031 Cellulitis of right toe: Secondary | ICD-10-CM | POA: Insufficient documentation

## 2014-09-15 DIAGNOSIS — C3412 Malignant neoplasm of upper lobe, left bronchus or lung: Secondary | ICD-10-CM | POA: Insufficient documentation

## 2014-09-15 DIAGNOSIS — L03011 Cellulitis of right finger: Secondary | ICD-10-CM | POA: Insufficient documentation

## 2014-09-15 DIAGNOSIS — L270 Generalized skin eruption due to drugs and medicaments taken internally: Secondary | ICD-10-CM | POA: Insufficient documentation

## 2014-09-15 DIAGNOSIS — Z79899 Other long term (current) drug therapy: Secondary | ICD-10-CM | POA: Insufficient documentation

## 2014-09-15 MED ORDER — FLUTICASONE PROPIONATE 50 MCG/ACT NA SUSP: 2.0000 | Freq: Every day | NASAL | Status: AC

## 2014-09-15 NOTE — Progress Notes (Signed)
Patient ID: Chad George, male   DOB: 1971/06/26, 44 y.o.   MRN: [id number]   Chad George, is a 44 y.o. male  WUJ:[id number]  [id number]  DOB - 10/30/1970  Chief Complaint  Patient presents with  . Follow-up        Subjective:   Chad George is a 44 y.o. male here today for a follow up visit. Patient has history of stage IV adenocarcinoma of the lung diagnosed in March 2015 currently on chemotherapy, initially onTarceva 150 mg daily started in March of 2015. Placed on hold 12/14/13 due to rash. This was resumed again on 12/24/2013. Patient is here today requesting a referral to dermatologist for his drug rash from chemotherapy. He continues to have issues with paronychia involving both great toes and his thumbs but this has improved. He is able to function properly and performs activities of daily living. He is able to drive without any issues. He is hindered at times performing fine motor activities. He denied any fever or chills. He continues to tolerate his Tarceva well with the exception of occasional episodes of diarrhea are well managed with Imodium. Patient has No headache, No chest pain, No abdominal pain - No Nausea, No new weakness tingling or numbness, No Cough - SOB.  Problem  Malignant Neoplasm of Upper Lobe of Left Lung  Rash and Nonspecific Skin Eruption    ALLERGIES: Allergies  Allergen Reactions  . Amoxicillin-Pot Clavulanate   . Penicillins     REACTION: RASH    PAST MEDICAL HISTORY: Past Medical History  Diagnosis Date  . High cholesterol   . Pneumonia   . Adenocarcinoma 11/21/2013    MEDICATIONS AT HOME: Prior to Admission medications   Medication Sig Start Date End Date Taking? Authorizing Provider  acetaminophen (TYLENOL) 325 MG tablet Take 650 mg by mouth every 6 (six) hours as needed.   Yes Historical Provider, MD  albuterol (PROVENTIL HFA;VENTOLIN HFA) 108 (90 BASE) MCG/ACT inhaler Inhale 2 puffs into the lungs every 6 (six) hours as needed for  wheezing or shortness of breath. 06/30/14  Yes Tresa Garter, MD  benzonatate (TESSALON) 100 MG capsule Take 1 to 2 capsules by mouth every 8 hours as needed for cough 03/10/14  Yes Adrena E Johnson, PA-C  chlorpheniramine-HYDROcodone (TUSSIONEX PENNKINETIC ER) 10-8 MG/5ML LQCR Take 5 mLs by mouth every 12 (twelve) hours as needed for cough. 11/21/13  Yes Kelvin Cellar, MD  doxycycline (VIBRAMYCIN) 100 MG capsule Take 1 capsule (100 mg total) by mouth 2 (two) times daily. 08/18/14  Yes Wyatt Portela, MD  erlotinib (TARCEVA) 150 MG tablet Take 1 tablet (150 mg total) by mouth daily. Take on an empty stomach 1 hour before meals or 2 hours after. 12/03/13  Yes Wyatt Portela, MD  fluticasone (FLONASE) 50 MCG/ACT nasal spray Place 2 sprays into both nostrils daily. 09/15/14  Yes Tresa Garter, MD  guaiFENesin (MUCINEX) 600 MG 12 hr tablet Take by mouth 2 (two) times daily.   Yes Historical Provider, MD  loratadine (CLARITIN) 10 MG tablet Take 1 tablet (10 mg total) by mouth daily. 10/16/13  Yes Reyne Dumas, MD  oxyCODONE (OXY IR/ROXICODONE) 5 MG immediate release tablet Take 1-2 tablets (5-10 mg total) by mouth every 6 (six) hours as needed for severe pain. 07/28/14  Yes Carlton Adam, PA-C     Objective:   Filed Vitals:   09/15/14 1642  BP: 116/77  Pulse: 79  Temp: 98 F (36.7 C)  TempSrc: Oral  Resp: 16  Weight: 165 lb (74.844 kg)  SpO2: 98%    Exam General appearance : Awake, alert, not in any distress. Speech Clear. Not toxic looking HEENT: Atraumatic and Normocephalic, pupils equally reactive to light and accomodation Neck: supple, no JVD. No cervical lymphadenopathy.  Chest:Good air entry bilaterally, no added sounds  CVS: S1 S2 regular, no murmurs.  Abdomen: Bowel sounds present, Non tender and not distended with no gaurding, rigidity or rebound. Extremities: B/L Lower Ext shows no edema, both legs are warm to touch Neurology: Awake alert, and oriented X 3, CN II-XII  intact, Non focal Skin:No Rash Wounds:N/A  Data Review Lab Results  Component Value Date   HGBA1C 5.1 08/16/2013     Assessment & Plan   1. 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  2. Rash and nonspecific skin eruption  - Ambulatory referral to Dermatology  3. Acute bronchitis due to other specified organisms  - fluticasone (FLONASE) 50 MCG/ACT nasal spray; Place 2 sprays into both nostrils daily.  Dispense: 16 g; Refill: 6  Patient was counseled extensively about nutrition and exercise.   Return in about 6 months (around 03/16/2015) for Follow up Pain and comorbidities.  The patient was given clear instructions to go to ER or return to medical center if symptoms don't improve, worsen or new problems develop. The patient verbalized understanding. The patient was told to call to get lab results if they haven't heard anything in the next week.   This note has been created with Surveyor, quantity. Any transcriptional errors are unintentional.    [name], MD, [place], [place], [place] and [place], [place]   09/15/2014, 4:58 PM

## 2014-09-15 NOTE — Progress Notes (Signed)
Pt is here requesting a referral to a dermatologist. Pt is having allergy's to his chemotherapy.

## 2014-09-30 ENCOUNTER — Ambulatory Visit (HOSPITAL_COMMUNITY): Payer: Self-pay

## 2014-09-30 ENCOUNTER — Other Ambulatory Visit: Payer: Self-pay

## 2014-10-03 ENCOUNTER — Telehealth: Payer: Self-pay | Admitting: Oncology

## 2014-10-03 NOTE — Telephone Encounter (Signed)
Pt called and confirmed to r/s labs from 01/22 to 01/26 due to CT Scan moved due to weather..... KJ

## 2014-10-04 ENCOUNTER — Ambulatory Visit (HOSPITAL_COMMUNITY)
Admission: RE | Admit: 2014-10-04 | Discharge: 2014-10-04 | Disposition: A | Discharge status: Home / Self Care | Payer: Self-pay | Source: Ambulatory Visit | Attending: Oncology | Admitting: Oncology

## 2014-10-04 ENCOUNTER — Other Ambulatory Visit (HOSPITAL_BASED_OUTPATIENT_CLINIC_OR_DEPARTMENT_OTHER): Payer: Self-pay

## 2014-10-04 ENCOUNTER — Encounter (HOSPITAL_COMMUNITY): Payer: Self-pay

## 2014-10-04 DIAGNOSIS — Z79899 Other long term (current) drug therapy: Secondary | ICD-10-CM | POA: Insufficient documentation

## 2014-10-04 DIAGNOSIS — C801 Malignant (primary) neoplasm, unspecified: Secondary | ICD-10-CM

## 2014-10-04 DIAGNOSIS — C7951 Secondary malignant neoplasm of bone: Secondary | ICD-10-CM | POA: Insufficient documentation

## 2014-10-04 DIAGNOSIS — Z08 Encounter for follow-up examination after completed treatment for malignant neoplasm: Secondary | ICD-10-CM | POA: Insufficient documentation

## 2014-10-04 DIAGNOSIS — C3490 Malignant neoplasm of unspecified part of unspecified bronchus or lung: Secondary | ICD-10-CM | POA: Insufficient documentation

## 2014-10-04 DIAGNOSIS — C3412 Malignant neoplasm of upper lobe, left bronchus or lung: Secondary | ICD-10-CM

## 2014-10-04 DIAGNOSIS — R918 Other nonspecific abnormal finding of lung field: Secondary | ICD-10-CM | POA: Insufficient documentation

## 2014-10-04 LAB — CBC WITH DIFFERENTIAL/PLATELET
BASO%: 0.2 % (ref 0.0–2.0)
BASOS ABS: 0 10*3/uL (ref 0.0–0.1)
EOS ABS: 0.3 10*3/uL (ref 0.0–0.5)
EOS%: 5.3 % (ref 0.0–7.0)
HCT: 47.3 % (ref 38.4–49.9)
HGB: 16.4 g/dL (ref 13.0–17.1)
LYMPH%: 32.6 % (ref 14.0–49.0)
MCH: 29.4 pg (ref 27.2–33.4)
MCHC: 34.7 g/dL (ref 32.0–36.0)
MCV: 84.8 fL (ref 79.3–98.0)
MONO#: 0.4 10*3/uL (ref 0.1–0.9)
MONO%: 7.3 % (ref 0.0–14.0)
NEUT#: 2.9 10*3/uL (ref 1.5–6.5)
NEUT%: 54.6 % (ref 39.0–75.0)
Platelets: 252 10*3/uL (ref 140–400)
RBC: 5.58 10*6/uL (ref 4.20–5.82)
RDW: 12.4 % (ref 11.0–14.6)
WBC: 5.3 10*3/uL (ref 4.0–10.3)
lymph#: 1.7 10*3/uL (ref 0.9–3.3)

## 2014-10-04 LAB — COMPREHENSIVE METABOLIC PANEL (CC13)
ALT: 35 U/L (ref 0–55)
AST: 26 U/L (ref 5–34)
Albumin: 4 g/dL (ref 3.5–5.0)
Alkaline Phosphatase: 125 U/L (ref 40–150)
Anion Gap: 9 mEq/L (ref 3–11)
BUN: 11.2 mg/dL (ref 7.0–26.0)
CALCIUM: 9 mg/dL (ref 8.4–10.4)
CO2: 27 meq/L (ref 22–29)
CREATININE: 0.8 mg/dL (ref 0.7–1.3)
Chloride: 106 mEq/L (ref 98–109)
EGFR: >90 mL/min/{1.73_m2} (ref >90)
Glucose: 91 mg/dl (ref 70–140)
Potassium: 4 mEq/L (ref 3.5–5.1)
Sodium: 142 mEq/L (ref 136–145)
Total Bilirubin: 0.95 mg/dL (ref 0.20–1.20)
Total Protein: 6.8 g/dL (ref 6.4–8.3)

## 2014-10-04 MED ORDER — IOHEXOL 300 MG/ML  SOLN
100.0000 mL | Freq: Once | INTRAMUSCULAR | Status: AC | PRN
  Administered 2014-10-04: 100 mL via INTRAVENOUS

## 2014-10-05 ENCOUNTER — Encounter: Payer: Self-pay | Admitting: Oncology

## 2014-10-05 ENCOUNTER — Ambulatory Visit (HOSPITAL_BASED_OUTPATIENT_CLINIC_OR_DEPARTMENT_OTHER): Payer: Self-pay | Admitting: Oncology

## 2014-10-05 ENCOUNTER — Encounter: Payer: Self-pay | Admitting: *Deleted

## 2014-10-05 ENCOUNTER — Telehealth: Payer: Self-pay | Admitting: Oncology

## 2014-10-05 VITALS — BP 130/82 | HR 76 | Temp 98.2°F | Resp 18 | Ht 66.0 in | Wt 166.4 lb

## 2014-10-05 DIAGNOSIS — R079 Chest pain, unspecified: Secondary | ICD-10-CM

## 2014-10-05 DIAGNOSIS — L03011 Cellulitis of right finger: Secondary | ICD-10-CM

## 2014-10-05 DIAGNOSIS — C3412 Malignant neoplasm of upper lobe, left bronchus or lung: Secondary | ICD-10-CM

## 2014-10-05 DIAGNOSIS — L03012 Cellulitis of left finger: Secondary | ICD-10-CM

## 2014-10-05 DIAGNOSIS — L03032 Cellulitis of left toe: Secondary | ICD-10-CM

## 2014-10-05 DIAGNOSIS — L03031 Cellulitis of right toe: Secondary | ICD-10-CM

## 2014-10-05 MED ORDER — AFATINIB DIMALEATE 40 MG PO TABS: 40.0000 mg | ORAL_TABLET | Freq: Every day | ORAL | Status: DC

## 2014-10-05 NOTE — Progress Notes (Signed)
Script for afatinib given to [employer] in managed care for financial assistance

## 2014-10-05 NOTE — Progress Notes (Signed)
Faxed gilotrif patient assistance form to [employer]

## 2014-10-05 NOTE — Progress Notes (Signed)
Hematology and Oncology Follow Up Visit  Chad George [id number] 1970-12-18 44 y.o. 10/05/2014 8:51 AM George, Chad Dare, MDJegede, Chad Clipper, MD   Principle Diagnosis: 44 year old gentleman with stage IV adenocarcinoma of the lung diagnosed in March of 2015. He presented with left upper lobe lung mass measuring 5.4 x 4.7 x 3.7 cm and bilateral pulmonary nodules. Pathology indicating adenocarcinoma of a lung primary. 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   Current therapy: Tarceva 150 mg daily started in March of 2015. Placed on hold 12/14/13 due to rash. This was resumed again on 12/24/2013.   Interim History:  Chad George presents today for routine followup. Since the last visit, he is asymptomatic. He continues to have issues with paronychia involving both great toes and his thumbs but this has improved. He is reporting some increased left-sided chest pain at times also some cough. He does not report any shortness of breath or hemoptysis. Has not reported any worsening of his dermatological complications. He does not report any diarrhea. His performance status remain excellent and not dramatically changed.   He is not reporting any headaches or blurry vision does not report any alteration of mental status psychiatric issues of depression. Is not reporting any chest pain or palpitation. Does not report any nausea or vomiting abdominal pain. Has not reported any hematochezia or melena. Has not reported any frequency urgency or hesitancy. Does not report any lymphadenopathy or petechiae. He does not report any skeletal pain.he does report paronychia affecting his toes and fingernails.  Remainder of his review of system is unremarkable.  Medications: I have reviewed the patient's current medications.  Current Outpatient Prescriptions  Medication Sig Dispense Refill  . acetaminophen (TYLENOL) 325 MG tablet Take 650 mg by mouth every 6 (six) hours as  needed.    Marland Kitchen afatinib dimaleate (GILOTRIF) 40 MG tablet Take 1 tablet (40 mg total) by mouth daily. Take on an empty stomach 1hr before or 2 hrs after meals. 30 tablet 0  . albuterol (PROVENTIL HFA;VENTOLIN HFA) 108 (90 BASE) MCG/ACT inhaler Inhale 2 puffs into the lungs every 6 (six) hours as needed for wheezing or shortness of breath. 1 Inhaler 1  . benzonatate (TESSALON) 100 MG capsule Take 1 to 2 capsules by mouth every 8 hours as needed for cough 45 capsule 2  . chlorpheniramine-HYDROcodone (TUSSIONEX PENNKINETIC ER) 10-8 MG/5ML LQCR Take 5 mLs by mouth every 12 (twelve) hours as needed for cough. 1 Bottle 0  . doxycycline (VIBRAMYCIN) 100 MG capsule Take 1 capsule (100 mg total) by mouth 2 (two) times daily. 60 capsule 1  . fluticasone (FLONASE) 50 MCG/ACT nasal spray Place 2 sprays into both nostrils daily. 16 g 6  . guaiFENesin (MUCINEX) 600 MG 12 hr tablet Take by mouth 2 (two) times daily.    Marland Kitchen loratadine (CLARITIN) 10 MG tablet Take 1 tablet (10 mg total) by mouth daily. 30 tablet 11  . oxyCODONE (OXY IR/ROXICODONE) 5 MG immediate release tablet Take 1-2 tablets (5-10 mg total) by mouth every 6 (six) hours as needed for severe pain. 30 tablet 0   No current facility-administered medications for this visit.     Allergies:  Allergies  Allergen Reactions  . Amoxicillin-Pot Clavulanate   . Penicillins     REACTION: RASH    Past Medical History, Surgical history, Social history, and Family History were reviewed and updated.    Physical Exam: Blood pressure 130/82, pulse 76, temperature 98.2 F (  36.8 C), temperature source Oral, resp. rate 18, height $RemoveBe'5\' 6"'gtqEmVTgZ$  (1.676 m), weight 166 lb 6.4 oz (75.479 kg). ECOG: 0 General appearance: alert and cooperative not in any distress. Head: Normocephalic, no masses or lesions. Neck: no adenopathy, no thyroid masses. Lymph nodes: Cervical, supraclavicular, and axillary nodes normal. Heart:regular rate and rhythm, S1, S2 normal, no murmur,  click, rub or gallop Lung:bilateral basilar rales heard. No wheezing or stridor.  Abdomen: soft, non-tender, without masses or organomegaly EXT:no erythema, induration, or nodules Skin: Diffuse acneiform rash on the face chest and back have decreased in intensity without any erythema or induration. No evidence of  Slight erythema noted at the tips of his fingers.  Grade 1  acneform skin eruptions primarily affecting the face, no evidence of superinfection. Paronychia involving the fingers and toes. Still persistent without any erythema or induration.  Lab Results: Lab Results  Component Value Date   WBC 5.3 10/04/2014   HGB 16.4 10/04/2014   HCT 47.3 10/04/2014   MCV 84.8 10/04/2014   PLT 252 10/04/2014     Chemistry      Component Value Date/Time   NA 142 10/04/2014 0825   NA 141 11/18/2013 0457   K 4.0 10/04/2014 0825   K 3.4* 11/18/2013 0457   CL 104 11/18/2013 0457   CO2 27 10/04/2014 0825   CO2 22 11/18/2013 0457   BUN 11.2 10/04/2014 0825   BUN 15 11/18/2013 0457   CREATININE 0.8 10/04/2014 0825   CREATININE 0.82 11/18/2013 0457   CREATININE 0.69 08/16/2013 1727      Component Value Date/Time   CALCIUM 9.0 10/04/2014 0825   CALCIUM 9.1 11/18/2013 0457   ALKPHOS 125 10/04/2014 0825   ALKPHOS 124* 11/16/2013 0630   AST 26 10/04/2014 0825   AST 18 11/16/2013 0630   ALT 35 10/04/2014 0825   ALT 17 11/16/2013 0630   BILITOT 0.95 10/04/2014 0825   BILITOT 0.6 11/16/2013 0630     EXAM: CT CHEST, ABDOMEN, AND PELVIS WITH CONTRAST  TECHNIQUE: Multidetector CT imaging of the chest, abdomen and pelvis was performed following the standard protocol during bolus administration of intravenous contrast.  CONTRAST: 148mL OMNIPAQUE IOHEXOL 300 MG/ML SOLN  COMPARISON: Prior examinations 06/28/2014 and 04/04/2014.  FINDINGS: CT CHEST FINDINGS  Mediastinum/Nodes: Several mediastinal lymph nodes have enlarged compared with the prior study. There is a 10 mm right  paratracheal node on image 14, a 7 mm precarinal node on image 21, a 10 mm subcarinal node on image 27 and a 14 mm left hilar node on image 26. The thyroid gland, trachea and esophagus demonstrate no significant findings. A small hiatal hernia appears stable. The heart size is stable. There is no pericardial effusion.There are no significant vascular findings.  Lungs/Pleura: There is no pleural effusion.As above, there is an enlarging left hilar mass with associated increased narrowing of the left upper lobe bronchus. There is increased postobstructive pneumonitis or radiation change medially in the left lung. Multiple enlarging pulmonary nodules are present bilaterally consistent with metastatic disease. The largest nodule is in the right lower lobe, measuring 7 mm on image 45.  Musculoskeletal/Chest wall: Numerous sclerotic osseous metastases are demonstrated throughout the a sternum, spine and ribs. These are mostly stable, although 1 in the right aspect of the T6 vertebral body has slightly progressed. There is no lytic lesion, pathologic fracture or epidural tumor.  CT ABDOMEN AND PELVIS FINDINGS  Hepatobiliary: There are stable small low-density hepatic lesions on images 51, 58 and 61, likely  benign based on stability. No new or enlarging lesions identified. No evidence of gallstones, gallbladder wall thickening or biliary dilatation.  Pancreas: Unremarkable. No pancreatic ductal dilatation or surrounding inflammatory changes.  Spleen: Complex low-density lesion anteriorly in the spleen has enlarged, measuring 2.7 cm on image 60. The spleen otherwise appears unremarkable.  Adrenals/Urinary Tract: Both adrenal glands appear normal.The right kidney appears normal. There are stable small nonobstructing left renal calculi and cysts. There is no hydronephrosis. The bladder appears normal.  Stomach/Bowel: No evidence of bowel wall thickening, distention  or surrounding inflammatory change.The appendix appears normal. There is no ascites or peritoneal nodularity. Moderate stool is present throughout the colon.  Vascular/Lymphatic: There are no enlarged abdominal or pelvic lymph nodes. No significant vascular findings are present.  Reproductive: Unremarkable.  Other: No evidence of abdominal wall mass or hernia.  Musculoskeletal: Multifocal sclerotic metastases are again noted within the lumbar spine, pelvis and proximal femurs. There is more lucency within the L4 vertebral body, but no pathologic fracture or epidural tumor.  IMPRESSION: 1. Thoracic recurrence with enlargement of multiple mediastinal lymph nodes, a recurrent left hilar mass and multiple pulmonary nodules. 2. Enlarging irregular low-density lesion anteriorly within the spleen, likely a metastasis. No other evidence of intra-abdominal metastatic disease. 3. Widespread blastic metastatic disease is similar to the prior study, although a few lesions demonstrate some progression. No evidence of pathologic fracture or epidural tumor.    Impression and Plan:   44 year-old gentleman with the following issues:  1. Stage IV adenocarcinoma of the lung with a left lung mass and bilateral pulmonary nodules. His tumor is EGFR mutated but the ALK mutation was not detected. He is currently on Tarceva since his diagnosis with excellent response up to this point. CT scan on 10/04/2014 was discussed today and showed progression of disease. His parenchymal metastasis as well as lymph node metastasis have increased. There is also questionable splenic metastasis.  Options of treatment were discussed today including second line therapy utilizing a different EGFR inhibitor versus potential referral to [facility] for a clinical trial. He has very limited resources and not really interested in traveling long distances  involving clinical trials. His tumor does harbor the exon 19  and deletion which make him potentially susceptible to treatment with Afatinib. Risks and benefits of this drug were discussed today he weighed complications include dermatological toxicity, diarrhea, fatigue among other complications were discussed and he is willing to proceed. We will discontinue Tarceva and start the process of prescribing this medication.  2.  Acneiform rash: Appears to be improving today. Not using any medication at this time.  4. Paronychia-likely related to Tarceva therapy. I'm hoping discontinuation of Tarceva might help his symptoms.  5. Dysuria: This have resolved at this time.  6. Follow up:  In 4 to 5 weeks.   [facility], MD 1/27/20168:51 AM

## 2014-10-05 NOTE — Telephone Encounter (Signed)
Gave avs & calendar for February. °

## 2014-10-06 ENCOUNTER — Encounter: Payer: Self-pay | Admitting: Oncology

## 2014-10-06 NOTE — Progress Notes (Signed)
Patient is approved for free gilotrif from [employer] from 10/06/14-10/07/15

## 2014-10-10 ENCOUNTER — Ambulatory Visit (HOSPITAL_COMMUNITY)
Admission: RE | Admit: 2014-10-10 | Discharge: 2014-10-10 | Disposition: A | Discharge status: Home / Self Care | Payer: Self-pay | Source: Ambulatory Visit | Attending: Nurse Practitioner | Admitting: Nurse Practitioner

## 2014-10-10 ENCOUNTER — Encounter: Payer: Self-pay | Admitting: Nurse Practitioner

## 2014-10-10 ENCOUNTER — Telehealth: Payer: Self-pay | Admitting: *Deleted

## 2014-10-10 ENCOUNTER — Other Ambulatory Visit (HOSPITAL_COMMUNITY): Payer: Self-pay

## 2014-10-10 ENCOUNTER — Ambulatory Visit (HOSPITAL_BASED_OUTPATIENT_CLINIC_OR_DEPARTMENT_OTHER): Payer: Self-pay | Admitting: Nurse Practitioner

## 2014-10-10 ENCOUNTER — Other Ambulatory Visit: Payer: Self-pay | Admitting: Nurse Practitioner

## 2014-10-10 VITALS — BP 120/77 | HR 91 | Temp 98.7°F | Resp 18 | Ht 66.0 in | Wt 160.8 lb

## 2014-10-10 DIAGNOSIS — C3492 Malignant neoplasm of unspecified part of left bronchus or lung: Secondary | ICD-10-CM

## 2014-10-10 DIAGNOSIS — L03032 Cellulitis of left toe: Secondary | ICD-10-CM

## 2014-10-10 DIAGNOSIS — R202 Paresthesia of skin: Secondary | ICD-10-CM | POA: Insufficient documentation

## 2014-10-10 DIAGNOSIS — R05 Cough: Secondary | ICD-10-CM

## 2014-10-10 DIAGNOSIS — M542 Cervicalgia: Secondary | ICD-10-CM | POA: Insufficient documentation

## 2014-10-10 DIAGNOSIS — G893 Neoplasm related pain (acute) (chronic): Secondary | ICD-10-CM

## 2014-10-10 DIAGNOSIS — C801 Malignant (primary) neoplasm, unspecified: Secondary | ICD-10-CM

## 2014-10-10 DIAGNOSIS — R200 Anesthesia of skin: Secondary | ICD-10-CM | POA: Insufficient documentation

## 2014-10-10 DIAGNOSIS — C3490 Malignant neoplasm of unspecified part of unspecified bronchus or lung: Secondary | ICD-10-CM | POA: Insufficient documentation

## 2014-10-10 DIAGNOSIS — M25511 Pain in right shoulder: Secondary | ICD-10-CM

## 2014-10-10 DIAGNOSIS — R1032 Left lower quadrant pain: Secondary | ICD-10-CM

## 2014-10-10 DIAGNOSIS — C3412 Malignant neoplasm of upper lobe, left bronchus or lung: Secondary | ICD-10-CM

## 2014-10-10 DIAGNOSIS — Z79899 Other long term (current) drug therapy: Secondary | ICD-10-CM | POA: Insufficient documentation

## 2014-10-10 DIAGNOSIS — R21 Rash and other nonspecific skin eruption: Secondary | ICD-10-CM

## 2014-10-10 DIAGNOSIS — R059 Cough, unspecified: Secondary | ICD-10-CM | POA: Insufficient documentation

## 2014-10-10 MED ORDER — DEXAMETHASONE 4 MG PO TABS: 4.0000 mg | ORAL_TABLET | Freq: Four times a day (QID) | ORAL | Status: DC

## 2014-10-10 MED ORDER — GADOBENATE DIMEGLUMINE 529 MG/ML IV SOLN
15.0000 mL | Freq: Once | INTRAVENOUS | Status: AC | PRN
  Administered 2014-10-10: 15 mL via INTRAVENOUS

## 2014-10-10 NOTE — Assessment & Plan Note (Addendum)
Patient continues with generalized acneform rash to scalp, face, anterior chest and back; as well as small amount to all extremities.  Patient feels that the rash has slightly improved since discontinuation of the Tarceva.  Continues to take doxycycline for the rash.

## 2014-10-10 NOTE — Progress Notes (Signed)
SYMPTOM MANAGEMENT CLINIC   HPI: Chad George 44 y.o. male diagnosed with lung adenocarcinoma.  Recently discontinued Tarceva oral therapy due to progression.  The plan is for the patient to initiate Afatinib oral therapy this week.  Patient called the cancer Center today requesting urgent care visit.  He is complaining of progressive dry cough that is keeping him awake at night.  He states that he has an albuterol inhaler at home; but this has been ineffective.  He states he's also tried oxycodone and Tussionex cough syrup with minimal relief.  He is also complaining of new onset right upper posterior shoulder discomfort within the past few days.  He states that this shoulder pain increases when he moves his neck and raises his right arm.  He is also complaining of some new onset numbness/tingling to his right arm that he noticed just this morning.  He continues to complain of some chronic left lower rib discomfort.  Patient states that this left rib discomfort has been present since prior to his initial diagnosis.  Also, patient has been suffering with a chronic acneform rash; most likely secondary to Tarceva use.  Patient states that his rash has slightly improved since discontinuation of the Tarceva.  He also continues to complain of paronychia is to his left great toe and his left ring finger.  He continues to take doxycycline as previously directed.  Patient denies any nausea, vomiting, diarrhea, or constipation.  He denies any recent fevers or chills.  Of note-patient states that he has noted his left eye twitching on occasion for the past few days as well.  His wife states that she has been more forgetful within the past 1-2 weeks as well.  Patient denies any vision changes or other neuro deficits.   HPI  ROS  Past Medical History  Diagnosis Date  . High cholesterol   . Pneumonia   . Adenocarcinoma 11/21/2013    Past Surgical History  Procedure Laterality Date  . Carpal tunner     . Tendon repair    . Video bronchoscopy Bilateral 11/17/2013    Procedure: VIDEO BRONCHOSCOPY WITH FLUORO;  Surgeon: Rush Farmer, MD;  Location: [facility];  Service: Cardiopulmonary;  Laterality: Bilateral;  . Video bronchoscopy with endobronchial ultrasound N/A 11/19/2013    Procedure: VIDEO BRONCHOSCOPY WITH ENDOBRONCHIAL ULTRASOUND;  Surgeon: Collene Gobble, MD;  Location: [facility];  Service: Thoracic;  Laterality: N/A;    has HERPES LABIALIS; ALLERGIC RHINITIS; Pulmonary nodules/lesions, multiple; Left pulmonary infiltrate on CXR; Hyperglycemia; Dyslipidemia; Mediastinal lymphadenopathy; Adenocarcinoma; Paronychia of great toe of left foot; Rash and nonspecific skin eruption; Lung cancer; Cancer associated pain; Cough; and Paresthesia on his problem list.    is allergic to amoxicillin-pot clavulanate and penicillins.    Medication List       This list is accurate as of: 10/10/14  6:19 PM.  Always use your most recent med list.               acetaminophen 325 MG tablet  Commonly known as:  TYLENOL  Take 650 mg by mouth every 6 (six) hours as needed.     afatinib dimaleate 40 MG tablet  Commonly known as:  GILOTRIF  Take 1 tablet (40 mg total) by mouth daily. Take on an empty stomach 1hr before or 2 hrs after meals.     albuterol 108 (90 BASE) MCG/ACT inhaler  Commonly known as:  PROVENTIL HFA;VENTOLIN HFA  Inhale 2 puffs into the lungs every 6 (  six) hours as needed for wheezing or shortness of breath.     benzonatate 100 MG capsule  Commonly known as:  TESSALON  Take 1 to 2 capsules by mouth every 8 hours as needed for cough     chlorpheniramine-HYDROcodone 10-8 MG/5ML Lqcr  Commonly known as:  TUSSIONEX PENNKINETIC ER  Take 5 mLs by mouth every 12 (twelve) hours as needed for cough.     dexamethasone 4 MG tablet  Commonly known as:  DECADRON  Take 1 tablet (4 mg total) by mouth 4 (four) times daily.     doxycycline 100 MG capsule  Commonly known as:  VIBRAMYCIN    Take 1 capsule (100 mg total) by mouth 2 (two) times daily.     fluticasone 50 MCG/ACT nasal spray  Commonly known as:  FLONASE  Place 2 sprays into both nostrils daily.     guaiFENesin 600 MG 12 hr tablet  Commonly known as:  MUCINEX  Take by mouth 2 (two) times daily.     loratadine 10 MG tablet  Commonly known as:  CLARITIN  Take 1 tablet (10 mg total) by mouth daily.     oxyCODONE 5 MG immediate release tablet  Commonly known as:  Oxy IR/ROXICODONE  Take 1-2 tablets (5-10 mg total) by mouth every 6 (six) hours as needed for severe pain.         PHYSICAL EXAMINATION  Blood pressure 120/77, pulse 91, temperature 98.7 F (37.1 C), resp. rate 18, height $RemoveBe'5\' 6"'HIzgMPvCO$  (1.676 m), weight 160 lb 12.8 oz (72.938 kg), SpO2 98 %.  Physical Exam  Constitutional: He is oriented to person, place, and time. Vital signs are normal. He appears unhealthy.  HENT:  Head: Normocephalic and atraumatic.  Right Ear: External ear normal.  Left Ear: External ear normal.  Mouth/Throat: Oropharynx is clear and moist.  Eyes: Conjunctivae and EOM are normal. Pupils are equal, round, and reactive to light. Right eye exhibits no discharge. Left eye exhibits no discharge. No scleral icterus.  Neck: Normal range of motion. Neck supple. No JVD present. No tracheal deviation present. No thyromegaly present.  Cardiovascular: Normal rate, regular rhythm, normal heart sounds and intact distal pulses.   Pulmonary/Chest: Effort normal and breath sounds normal. No respiratory distress. He has no wheezes. He has no rales. He exhibits no tenderness.  Abdominal: Soft. Bowel sounds are normal. He exhibits no distension and no mass. There is no tenderness. There is no rebound and no guarding.  Musculoskeletal: Normal range of motion. He exhibits tenderness. He exhibits no edema.  Mild tenderness to left lower lateral rib area with palpation.  Increased pain with palpation and movement to right upper posterior shoulder area.   No evidence of injury or trauma.  Lymphadenopathy:    He has no cervical adenopathy.  Neurological: He is alert and oriented to person, place, and time. Gait normal.  No eye twitching or other neurological deficits on exam.  Skin: Skin is warm and dry. Rash noted. No erythema.  Patient has a generalized acneform rash to his scalp, face, anterior chest, and back.  Also has some scattered rash to his extremities.  Psychiatric: Affect normal.  Nursing note and vitals reviewed.   LABORATORY DATA:. No visits with results within 3 Day(s) from this visit. Latest known visit with results is:  Appointment on 10/04/2014  Component Date Value Ref Range Status  . WBC 10/04/2014 5.3  4.0 - 10.3 10e3/uL Final  . NEUT# 10/04/2014 2.9  1.5 - 6.5 10e3/uL  Final  . HGB 10/04/2014 16.4  13.0 - 17.1 g/dL Final  . HCT 10/04/2014 47.3  38.4 - 49.9 % Final  . Platelets 10/04/2014 252  140 - 400 10e3/uL Final  . MCV 10/04/2014 84.8  79.3 - 98.0 fL Final  . MCH 10/04/2014 29.4  27.2 - 33.4 pg Final  . MCHC 10/04/2014 34.7  32.0 - 36.0 g/dL Final  . RBC 10/04/2014 5.58  4.20 - 5.82 10e6/uL Final  . RDW 10/04/2014 12.4  11.0 - 14.6 % Final  . lymph# 10/04/2014 1.7  0.9 - 3.3 10e3/uL Final  . MONO# 10/04/2014 0.4  0.1 - 0.9 10e3/uL Final  . Eosinophils Absolute 10/04/2014 0.3  0.0 - 0.5 10e3/uL Final  . Basophils Absolute 10/04/2014 0.0  0.0 - 0.1 10e3/uL Final  . NEUT% 10/04/2014 54.6  39.0 - 75.0 % Final  . LYMPH% 10/04/2014 32.6  14.0 - 49.0 % Final  . MONO% 10/04/2014 7.3  0.0 - 14.0 % Final  . EOS% 10/04/2014 5.3  0.0 - 7.0 % Final  . BASO% 10/04/2014 0.2  0.0 - 2.0 % Final  . Sodium 10/04/2014 142  136 - 145 mEq/L Final  . Potassium 10/04/2014 4.0  3.5 - 5.1 mEq/L Final  . Chloride 10/04/2014 106  98 - 109 mEq/L Final  . CO2 10/04/2014 27  22 - 29 mEq/L Final  . Glucose 10/04/2014 91  70 - 140 mg/dl Final  . BUN 10/04/2014 11.2  7.0 - 26.0 mg/dL Final  . Creatinine 10/04/2014 0.8  0.7 - 1.3 mg/dL  Final  . Total Bilirubin 10/04/2014 0.95  0.20 - 1.20 mg/dL Final  . Alkaline Phosphatase 10/04/2014 125  40 - 150 U/L Final  . AST 10/04/2014 26  5 - 34 U/L Final  . ALT 10/04/2014 35  0 - 55 U/L Final  . Total Protein 10/04/2014 6.8  6.4 - 8.3 g/dL Final  . Albumin 10/04/2014 4.0  3.5 - 5.0 g/dL Final  . Calcium 10/04/2014 9.0  8.4 - 10.4 mg/dL Final  . Anion Gap 10/04/2014 9  3 - 11 mEq/L Final  . EGFR 10/04/2014 >90  >90 ml/min/1.73 m2 Final   eGFR is calculated using the CKD-EPI Creatinine Equation (2009)     RADIOGRAPHIC STUDIES: Mr Jeri Cos Wo Contrast  Oct 13, 2014   CLINICAL DATA:  Neck pain and right arm weakness and numbness for 1 day. Personal history of metastatic lung cancer. Chemotherapy ongoing.  EXAM: MRI HEAD WITHOUT CONTRAST  MRI CERVICAL SPINE WITHOUT CONTRAST  TECHNIQUE: Multiplanar, multiecho pulse sequences of the brain and surrounding structures, and cervical spine, to include the craniocervical junction and cervicothoracic junction, were obtained without intravenous contrast.  COMPARISON:  None.  FINDINGS: MRI HEAD FINDINGS  There is no evidence acute infarct, midline shift, or extra-axial fluid collection. Ventricles and sulci are within normal limits for age.  There are numerous (greater than 40) supratentorial and infratentorial enhancing brain lesions. The largest cerebellar lesion is located in the left hemisphere and measures 2.5 x 1.7 cm (series 20 image 18) with small amount of chronic blood products, mild surrounding edema, and no significant mass effect. Left temporal lesions measure 2.3 x 2.0 cm (series 20, image 19) and 2.0 x 2.0 cm (series 20, image 22), with mild surrounding edema. An index anterior right temporal lesion measures 2.7 x 1.8 cm (series 20, image 21) without significant edema. Index left frontal lobe lesion measures 2.2 x 2.0 cm (series 20, image 38), with a small amount of surrounding  edema without mass effect. There is a 7 mm lesion in the right  thalamus (series 20, image 30).  A 4 mm enhancing osseous metastasis is questioned in the left parietal bone (series 20, image 44). Orbits are unremarkable. Minimal mucosal thickening is noted in the maxillary sinuses. Mastoid air cells are clear. Major intracranial vascular flow voids are preserved.  MRI CERVICAL SPINE FINDINGS  Vertebral alignment is within normal limits. Cervical vertebral bone marrow signal is within normal limits without evidence of edema or enhancing lesions. There is a 9 mm T1 and T2 hypointense, nonenhancing focus in the left T3 transverse process corresponding to a sclerotic metastasis on recent chest CT. The cervical spinal cord is normal in caliber and signal. There is no evidence of epidural tumor. Multilevel disc desiccation is present in the cervical spine without significant disc space height loss. Paraspinal soft tissues are unremarkable.  C2-3:  Negative.  C3-4:  Mild uncovertebral spurring without stenosis.  C4-5:  Mild disc bulging without stenosis.  C5-6:  Negative.  C6-7:  Negative.  C7-T1:  Negative.  IMPRESSION: 1. Greater than 40 brain metastases measuring up to 2.7 cm in size. No significant mass effect. 2. Question 4 mm left parietal osseous metastasis. 3. No evidence of metastatic disease in the cervical spine. Sclerotic metastasis in the left T3 transverse process. 4. Very mild cervical spondylosis without stenosis. These results were called by telephone at the time of interpretation on 10/10/2014 at 3:09 pm to [facility] , who verbally acknowledged these results.   Electronically Signed   By: Logan Bores   On: 10/10/2014 15:10    ASSESSMENT/PLAN:    Rash and nonspecific skin eruption Patient continues with generalized acneform rash to scalp, face, anterior chest and back; as well as small amount to all extremities.  Patient feels that the rash has slightly improved since discontinuation of the Tarceva.  Continues to take doxycycline for the rash.      Paronychia of great toe of left foot Patient has paronychia to his left great toe and his left ring finger chronically.  He states that these paronychias have been present for several months; and intermittently flare up.  Patient continues on doxycycline as previously directed.   Paresthesia Patient is complaining today of new onset right arm numbness and tingling.  He denies any known injury or trauma to his right upper extremity.  Cervical MRI revealed no definitive reason for paresthesia; with no obvious cord compression noted.  Right upper extremity paresthesia could be secondary to newly diagnosed brain metastasis.   Lung cancer Patiently recently discontinued oral Tarceva therapy due to progression revealed by restaging CT of the chest/abdomen/pelvis obtained on 10/04/2014.  Patient states that he is scheduled to have home delivery of Afatinib oral therapy tomorrow; and was planning on initiating therapy tomorrow as well.  However, brain and cervical MRI obtained earlier today revealed multiple enhancing brain lesions and mild surrounding edema.  Carefully reviewed all scan results with both patient and his wife this afternoon.  Advised patient to initiate dexamethasone 4 mg 4 times a day for brain edema; and to hold on initiating new oral therapy.  Will review all findings with Dr. Alen Blew in the morning; and then call patient with follow-up planned.     Cough Patient is complaining of progressive dry cough; keeping him awake at night.  He states that taking oxycodone and/or Tussionex cough syrup is only minimally effective.  Hopefully, the addition of the dexamethasone will also help with  patient's cough.  Patient was also encouraged to continue with either oxycodone or Tussionex as previously directed.  Patient already has an albuterol inhaler at home; and he was advised to take 1-2 puffs every 4-6 hours as needed as well.   Cancer associated pain Patient is complaining of some chronic  left low rib pain and some new onset right upper posterior shoulder discomfort.  Patient states that this pain increases with specific movement of his neck and ROM of his right arm.  No evidence of injury or trauma on exam.  Cervical MRI reveals no cord compression.  Patient denies any chest pain or chest pressure.  He also denies any shortness of breath or pain with inspiration.  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  Advised patient to continue with oxycodone as previously directed.   Patient stated understanding of all instructions; and was in agreement with this plan of care. The patient knows to call the clinic with any problems, questions or concerns.   Review/collaboration with Dr. Lindi Adie (on call for Dr. Alen Blew)  regarding all aspects of patient's visit today.   Total time spent with patient was 40 minutes;  with greater than 75 percent of that time spent in face to face counseling regarding patient's symptoms,  and coordination of care and follow up.  Disclaimer: This note was dictated with voice recognition software. Similar sounding words can inadvertently be transcribed and may not be corrected upon review.   Drue Second, NP 10/10/2014

## 2014-10-10 NOTE — Assessment & Plan Note (Signed)
Patient is complaining of some chronic left low rib pain and some new onset right upper posterior shoulder discomfort.  Patient states that this pain increases with specific movement of his neck and ROM of his right arm.  No evidence of injury or trauma on exam.  Cervical MRI reveals no cord compression.  Patient denies any chest pain or chest pressure.  He also denies any shortness of breath or pain with inspiration.  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  Advised patient to continue with oxycodone as previously directed.

## 2014-10-10 NOTE — Assessment & Plan Note (Signed)
Patient is complaining today of new onset right arm numbness and tingling.  He denies any known injury or trauma to his right upper extremity.  Cervical MRI revealed no definitive reason for paresthesia; with no obvious cord compression noted.  Right upper extremity paresthesia could be secondary to newly diagnosed brain metastasis.

## 2014-10-10 NOTE — Assessment & Plan Note (Signed)
Patient is complaining of progressive dry cough; keeping him awake at night.  He states that taking oxycodone and/or Tussionex cough syrup is only minimally effective.  Hopefully, the addition of the dexamethasone will also help with patient's cough.  Patient was also encouraged to continue with either oxycodone or Tussionex as previously directed.  Patient already has an albuterol inhaler at home; and he was advised to take 1-2 puffs every 4-6 hours as needed as well.

## 2014-10-10 NOTE — Assessment & Plan Note (Signed)
Patiently recently discontinued oral Tarceva therapy due to progression revealed by restaging CT of the chest/abdomen/pelvis obtained on 10/04/2014.  Patient states that he is scheduled to have home delivery of Afatinib oral therapy tomorrow; and was planning on initiating therapy tomorrow as well.  However, brain and cervical MRI obtained earlier today revealed multiple enhancing brain lesions and mild surrounding edema.  Carefully reviewed all scan results with both patient and his wife this afternoon.  Advised patient to initiate dexamethasone 4 mg 4 times a day for brain edema; and to hold on initiating new oral therapy.  Will review all findings with Dr. Alen Blew in the morning; and then call patient with follow-up planned.

## 2014-10-10 NOTE — Telephone Encounter (Signed)
This RN was notified of urgent call per beeper in [place].  Call returned to pt who states he has onset of increased pain in chest and back.  Pain is interfering with sleep.  Pt also has developed over the past 3 days a cough that is sharp, dry and almost in spasms.  Migual states he started using the oxycodone yesterday with relief for 2 hours - then discomfort returns.  He also states " I do not like taking the medication " and only used it twice.  Pt's states he also has had diarrhea x 3 days with no incontinence.  Urination is normal and again pt is continent.  Per conversation there was a language barrier with certain inquiries with this nurse having to revise questions- with pt able to understand and answer appropriately.  Pt was unable to complete sentences due to sharp cough.  Appointment made for symtom management this am.  Pt verbalized understanding and appreciation of above.

## 2014-10-10 NOTE — Assessment & Plan Note (Signed)
Patient has paronychia to his left great toe and his left ring finger chronically.  He states that these paronychias have been present for several months; and intermittently flare up.  Patient continues on doxycycline as previously directed.

## 2014-10-11 ENCOUNTER — Other Ambulatory Visit: Payer: Self-pay | Admitting: Oncology

## 2014-10-11 ENCOUNTER — Encounter: Payer: Self-pay | Admitting: Radiation Oncology

## 2014-10-11 ENCOUNTER — Telehealth: Payer: Self-pay | Admitting: *Deleted

## 2014-10-11 DIAGNOSIS — C801 Malignant (primary) neoplasm, unspecified: Secondary | ICD-10-CM

## 2014-10-11 NOTE — Progress Notes (Signed)
Location/Histology of Brain Tumor: Lung Primary (Dx 11/19/13) with Brain metastases(40)  New onset numbness /tingling right arm ,increased forgetfulness  Past 1-2 weeks  Patient presented with symptoms of:  Asymptomatic   Past or anticipated interventions, if any, per neurosurgery:   Past or anticipated interventions, if any, per medical oncology: Dr. Alen Blew 10/05/14 saw  Drue Second, NP symptom management  10/10/14 , to start Afatinib oral once it arrives, , with Dexamethasone started already, MRI brain 10/10/14=40 enhancing brain lesions, Tarceva d/c due to progression Adrena Johnson appt 11/02/14  Dose of Decadron, if applicable: 4mg  4 times daily   Recent neurologic symptoms, if any:   Seizures: No  Headaches: had severe head ache last night left temporal and frontal, didn't sleep well,   Nausea : No  Dizziness/ataxia: slight mornings and night time bedtime feels dizzy  Difficulty with hand coordination: No  Focal numbness/weakness: Right arm numbness/tingling still,   Visual deficits/changes: No  Confusion/Memory deficits: forgetfulness past 1-2 weeks  Painful bone metastases at present, if any: MRI 10/10/14=  1. Greater than 40 brain metastases measuring up to 2.7 cm in size. No significant mass effect. 2. Question 4 mm left parietal osseous metastasis. 3. No evidence of metastatic disease in the cervical spine. Sclerotic metastasis in the left T3 transverse process.  SAFETY ISSUES:  Prior radiation? No  Pacemaker/ICD? No    Is the patient on methotrexate? No  Additional Complaints / other details:  , Married, 3 children,  stage IV  Adenocarcinoma Lung, Hx Pneumonia, Never smoker ,no alcohol or illicit drug use,  Father stroke, living, mother living, no health issues,   Allergies: Amoxicillin-Pot Clavulanate,PCNS=rash

## 2014-10-11 NOTE — Progress Notes (Signed)
Events in the last 24 hours noted. The MRI results was discussed with the patient over the phone. I instructed him to continue with the dexamethasone as well as start Afatinib once it was received. Clinically, he reports he is feeling better with this dexamethasone without any neurological deficits. He has not reported any seizures or alteration in mental status.  An urgent consult to radiation oncology was placed. I also called the radiation oncology Department and placed a message for the triage nurse regarding this issue.  I told Chad George that he will be contacted fairly soon to start radiation to the brain. The combination of radiation therapy and Afatinib of would be reasonable to treat his brain metastasis.

## 2014-10-11 NOTE — Telephone Encounter (Signed)
No entry 

## 2014-10-12 ENCOUNTER — Ambulatory Visit
Admission: RE | Admit: 2014-10-12 | Discharge: 2014-10-12 | Disposition: A | Discharge status: Home / Self Care | Payer: Self-pay | Source: Ambulatory Visit | Attending: Radiation Oncology | Admitting: Radiation Oncology

## 2014-10-12 ENCOUNTER — Encounter: Payer: Self-pay | Admitting: Radiation Oncology

## 2014-10-12 VITALS — BP 132/71 | HR 71 | Temp 98.5°F | Resp 20 | Ht 66.0 in | Wt 163.6 lb

## 2014-10-12 DIAGNOSIS — C7931 Secondary malignant neoplasm of brain: Secondary | ICD-10-CM | POA: Insufficient documentation

## 2014-10-12 DIAGNOSIS — M25511 Pain in right shoulder: Secondary | ICD-10-CM | POA: Insufficient documentation

## 2014-10-12 DIAGNOSIS — Z7952 Long term (current) use of systemic steroids: Secondary | ICD-10-CM | POA: Insufficient documentation

## 2014-10-12 DIAGNOSIS — Z7951 Long term (current) use of inhaled steroids: Secondary | ICD-10-CM | POA: Insufficient documentation

## 2014-10-12 DIAGNOSIS — C7951 Secondary malignant neoplasm of bone: Secondary | ICD-10-CM

## 2014-10-12 DIAGNOSIS — C3492 Malignant neoplasm of unspecified part of left bronchus or lung: Secondary | ICD-10-CM | POA: Insufficient documentation

## 2014-10-12 DIAGNOSIS — Z79899 Other long term (current) drug therapy: Secondary | ICD-10-CM | POA: Insufficient documentation

## 2014-10-12 DIAGNOSIS — Z510 Encounter for antineoplastic radiation therapy: Secondary | ICD-10-CM | POA: Insufficient documentation

## 2014-10-12 DIAGNOSIS — C7889 Secondary malignant neoplasm of other digestive organs: Secondary | ICD-10-CM | POA: Insufficient documentation

## 2014-10-12 HISTORY — DX: Allergy, unspecified, initial encounter: T78.40XA

## 2014-10-12 HISTORY — DX: Malignant neoplasm of brain, unspecified: C71.9

## 2014-10-12 NOTE — Progress Notes (Signed)
Radiation Oncology         [phone] ________________________________  Name: Chad George MRN: [id number]  Date: 10/12/2014  DOB: June 30, 1971  XN:ATFTDD, [name], MD  Wyatt Portela, MD     REFERRING PHYSICIAN: Wyatt Portela, MD   DIAGNOSIS: The encounter diagnosis was Brain metastasis.   HISTORY OF PRESENT ILLNESS::Chad George is a 44 y.o. 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  The patient presented with stage IV disease in March 2015. He initially had a left lung mass in addition to bilateral pulmonary nodules.  The patient has undergone palliative systemic treatment through medical oncology. He has taken Tarceva chemotherapy but this was discontinued recently and he has been switched to Afatinib in the presence of some progressive disease.  The patient was recently seen in the symptom management clinic with various complaints including some posterior shoulder discomfort on the right, left flank pain, and also some new onset numbness and tingling in his right arm. The patient had undergone a MRI scan of the brain on 10/10/2014. Numerous brain metastases were seen, greater than 40, measuring up to 2.7 cm in size. Additional bony disease was seen. No significant cervical spine disease was seen within the cord compression. The patient had previously also undergone restaging CT studies on 10/04/2014. This showed progressive disease in addition to widespread blastic metastatic disease. No evidence of pathologic fracture or epidural tumor associated with any of these tumors.  The patient today complains of some phlegm for which she is taking Mucinex. He overall feels that he is breathing okay but he does have some episodes when he is concerned about his breathing especially at night. He does continue to complain of significant pain in the posterior right shoulder and persistent  significant pain in the left flank region which appears to correspond to a anterior splenic metastasis which is adjacent to the abdominal wall at this location.    PREVIOUS RADIATION THERAPY: No   PAST MEDICAL HISTORY:  has a past medical history of High cholesterol; Pneumonia; Adenocarcinoma (11/21/2013); Brain cancer (10/10/14 MRI); and Allergy.     PAST SURGICAL HISTORY: Past Surgical History  Procedure Laterality Date  . Carpal tunner    . Tendon repair    . Video bronchoscopy Bilateral 11/17/2013    Procedure: VIDEO BRONCHOSCOPY WITH FLUORO;  Surgeon: Rush Farmer, MD;  Location: [facility];  Service: Cardiopulmonary;  Laterality: Bilateral;  . Video bronchoscopy with endobronchial ultrasound N/A 11/19/2013    Procedure: VIDEO BRONCHOSCOPY WITH ENDOBRONCHIAL ULTRASOUND;  Surgeon: Collene Gobble, MD;  Location: [facility];  Service: Thoracic;  Laterality: N/A;     FAMILY HISTORY: family history includes Stroke in his father.   SOCIAL HISTORY:  reports that he has never smoked. He has never used smokeless tobacco. He reports that he does not drink alcohol or use illicit drugs.   ALLERGIES: Amoxicillin-pot clavulanate and Penicillins   MEDICATIONS:  Current Outpatient Prescriptions  Medication Sig Dispense Refill  . acetaminophen (TYLENOL) 325 MG tablet Take 650 mg by mouth every 6 (six) hours as needed.    Marland Kitchen afatinib dimaleate (GILOTRIF) 40 MG tablet Take 1 tablet (40 mg total) by mouth daily. Take on an empty stomach 1hr before or 2 hrs after meals. 30 tablet 0  . albuterol (PROVENTIL HFA;VENTOLIN HFA) 108 (90 BASE) MCG/ACT inhaler Inhale 2 puffs into the lungs every 6 (six) hours as needed for wheezing or  shortness of breath. 1 Inhaler 1  . benzonatate (TESSALON) 100 MG capsule Take 1 to 2 capsules by mouth every 8 hours as needed for cough 45 capsule 2  . chlorpheniramine-HYDROcodone (TUSSIONEX PENNKINETIC ER) 10-8 MG/5ML LQCR Take 5 mLs by mouth every 12 (twelve) hours as  needed for cough. 1 Bottle 0  . dexamethasone (DECADRON) 4 MG tablet Take 1 tablet (4 mg total) by mouth 4 (four) times daily. 40 tablet 1  . doxycycline (VIBRAMYCIN) 100 MG capsule Take 1 capsule (100 mg total) by mouth 2 (two) times daily. 60 capsule 1  . fluticasone (FLONASE) 50 MCG/ACT nasal spray Place 2 sprays into both nostrils daily. 16 g 6  . guaiFENesin (MUCINEX) 600 MG 12 hr tablet Take by mouth 2 (two) times daily.    Marland Kitchen oxyCODONE (OXY IR/ROXICODONE) 5 MG immediate release tablet Take 1-2 tablets (5-10 mg total) by mouth every 6 (six) hours as needed for severe pain. 30 tablet 0  . loratadine (CLARITIN) 10 MG tablet Take 1 tablet (10 mg total) by mouth daily. (Patient not taking: Reported on 10/12/2014) 30 tablet 11   No current facility-administered medications for this encounter.     REVIEW OF SYSTEMS:  A 15 point review of systems is documented in the electronic medical record. This was obtained by the nursing staff. However, I reviewed this with the patient to discuss relevant findings and make appropriate changes.  Pertinent items are noted in HPI.    PHYSICAL EXAM:  height is 5\' 6"  (1.676 m) and weight is 163 lb 9.6 oz (74.208 kg). His oral temperature is 98.5 F (36.9 C). His blood pressure is 132/71 and his pulse is 71. His respiration is 20 and oxygen saturation is 98%.   ECOG = 1  0 - Asymptomatic (Fully active, able to carry on all predisease activities without restriction)  1 - Symptomatic but completely ambulatory (Restricted in physically strenuous activity but ambulatory and able to carry out work of a light or sedentary nature. For example, light housework, office work)  2 - Symptomatic, <50% in bed during the day (Ambulatory and capable of all self care but unable to carry out any work activities. Up and about more than 50% of waking hours)  3 - Symptomatic, >50% in bed, but not bedbound (Capable of only limited self-care, confined to bed or chair 50% or more of  waking hours)  4 - Bedbound (Completely disabled. Cannot carry on any self-care. Totally confined to bed or chair)  5 - Death   Eustace Pen MM, Creech RH, Tormey DC, et al. [id number]). "Toxicity and response criteria of the [facility]". [place] Oncol. 5 (6): 649-55  General: Well-developed, in no acute distress HEENT: Normocephalic, atraumatic; oral cavity clear Neck: Supple without any lymphadenopathy Cardiovascular: Regular rate and rhythm Respiratory: Clear to auscultation bilaterally GI: Soft, nontender, normal bowel sounds; tenderness is present at the left flank Extremities: No edema present; some tenderness to palpation along the scapula medial to the right shoulder posteriorly Neuro: No focal deficits     LABORATORY DATA:  Lab Results  Component Value Date   WBC 5.3 10/04/2014   HGB 16.4 10/04/2014   HCT 47.3 10/04/2014   MCV 84.8 10/04/2014   PLT 252 10/04/2014   Lab Results  Component Value Date   NA 142 10/04/2014   K 4.0 10/04/2014   CL 104 11/18/2013   CO2 27 10/04/2014   Lab Results  Component Value Date   ALT 35  10/04/2014   AST 26 10/04/2014   ALKPHOS 125 10/04/2014   BILITOT 0.95 10/04/2014      RADIOGRAPHY: Ct Chest W Contrast  10/04/2014   CLINICAL DATA:  Lung cancer diagnosed 10 months ago. Chemotherapy ongoing. Subsequent encounter.  EXAM: CT CHEST, ABDOMEN, AND PELVIS WITH CONTRAST  TECHNIQUE: Multidetector CT imaging of the chest, abdomen and pelvis was performed following the standard protocol during bolus administration of intravenous contrast.  CONTRAST:  158mL OMNIPAQUE IOHEXOL 300 MG/ML  SOLN  COMPARISON:  Prior examinations 06/28/2014 and 04/04/2014.  FINDINGS: CT CHEST FINDINGS  Mediastinum/Nodes: Several mediastinal lymph nodes have enlarged compared with the prior study. There is a 10 mm right paratracheal node on image 14, a 7 mm precarinal node on image 21, a 10 mm subcarinal node on image 27 and a 14 mm left hilar node  on image 26. The thyroid gland, trachea and esophagus demonstrate no significant findings. A small hiatal hernia appears stable. The heart size is stable. There is no pericardial effusion.There are no significant vascular findings.  Lungs/Pleura: There is no pleural effusion.As above, there is an enlarging left hilar mass with associated increased narrowing of the left upper lobe bronchus. There is increased postobstructive pneumonitis or radiation change medially in the left lung. Multiple enlarging pulmonary nodules are present bilaterally consistent with metastatic disease. The largest nodule is in the right lower lobe, measuring 7 mm on image 45.  Musculoskeletal/Chest wall: Numerous sclerotic osseous metastases are demonstrated throughout the a sternum, spine and ribs. These are mostly stable, although 1 in the right aspect of the T6 vertebral body has slightly progressed. There is no lytic lesion, pathologic fracture or epidural tumor.  CT ABDOMEN AND PELVIS FINDINGS  Hepatobiliary: There are stable small low-density hepatic lesions on images 51, 58 and 61, likely benign based on stability. No new or enlarging lesions identified. No evidence of gallstones, gallbladder wall thickening or biliary dilatation.  Pancreas: Unremarkable. No pancreatic ductal dilatation or surrounding inflammatory changes.  Spleen: Complex low-density lesion anteriorly in the spleen has enlarged, measuring 2.7 cm on image 60. The spleen otherwise appears unremarkable.  Adrenals/Urinary Tract: Both adrenal glands appear normal.The right kidney appears normal. There are stable small nonobstructing left renal calculi and cysts. There is no hydronephrosis. The bladder appears normal.  Stomach/Bowel: No evidence of bowel wall thickening, distention or surrounding inflammatory change.The appendix appears normal. There is no ascites or peritoneal nodularity. Moderate stool is present throughout the colon.  Vascular/Lymphatic: There are no  enlarged abdominal or pelvic lymph nodes. No significant vascular findings are present.  Reproductive: Unremarkable.  Other: No evidence of abdominal wall mass or hernia.  Musculoskeletal: Multifocal sclerotic metastases are again noted within the lumbar spine, pelvis and proximal femurs. There is more lucency within the L4 vertebral body, but no pathologic fracture or epidural tumor.  IMPRESSION: 1. Thoracic recurrence with enlargement of multiple mediastinal lymph nodes, a recurrent left hilar mass and multiple pulmonary nodules. 2. Enlarging irregular low-density lesion anteriorly within the spleen, likely a metastasis. No other evidence of intra-abdominal metastatic disease. 3. Widespread blastic metastatic disease is similar to the prior study, although a few lesions demonstrate some progression. No evidence of pathologic fracture or epidural tumor.   Electronically Signed   By: Camie Patience M.D.   On: 10/04/2014 10:21   Mr Jeri Cos XW Contrast  10/10/2014   CLINICAL DATA:  Neck pain and right arm weakness and numbness for 1 day. Personal history of metastatic lung cancer. Chemotherapy  ongoing.  EXAM: MRI HEAD WITHOUT CONTRAST  MRI CERVICAL SPINE WITHOUT CONTRAST  TECHNIQUE: Multiplanar, multiecho pulse sequences of the brain and surrounding structures, and cervical spine, to include the craniocervical junction and cervicothoracic junction, were obtained without intravenous contrast.  COMPARISON:  None.  FINDINGS: MRI HEAD FINDINGS  There is no evidence acute infarct, midline shift, or extra-axial fluid collection. Ventricles and sulci are within normal limits for age.  There are numerous (greater than 40) supratentorial and infratentorial enhancing brain lesions. The largest cerebellar lesion is located in the left hemisphere and measures 2.5 x 1.7 cm (series 20 image 18) with small amount of chronic blood products, mild surrounding edema, and no significant mass effect. Left temporal lesions measure 2.3 x  2.0 cm (series 20, image 19) and 2.0 x 2.0 cm (series 20, image 22), with mild surrounding edema. An index anterior right temporal lesion measures 2.7 x 1.8 cm (series 20, image 21) without significant edema. Index left frontal lobe lesion measures 2.2 x 2.0 cm (series 20, image 38), with a small amount of surrounding edema without mass effect. There is a 7 mm lesion in the right thalamus (series 20, image 30).  A 4 mm enhancing osseous metastasis is questioned in the left parietal bone (series 20, image 44). Orbits are unremarkable. Minimal mucosal thickening is noted in the maxillary sinuses. Mastoid air cells are clear. Major intracranial vascular flow voids are preserved.  MRI CERVICAL SPINE FINDINGS  Vertebral alignment is within normal limits. Cervical vertebral bone marrow signal is within normal limits without evidence of edema or enhancing lesions. There is a 9 mm T1 and T2 hypointense, nonenhancing focus in the left T3 transverse process corresponding to a sclerotic metastasis on recent chest CT. The cervical spinal cord is normal in caliber and signal. There is no evidence of epidural tumor. Multilevel disc desiccation is present in the cervical spine without significant disc space height loss. Paraspinal soft tissues are unremarkable.  C2-3:  Negative.  C3-4:  Mild uncovertebral spurring without stenosis.  C4-5:  Mild disc bulging without stenosis.  C5-6:  Negative.  C6-7:  Negative.  C7-T1:  Negative.  IMPRESSION: 1. Greater than 40 brain metastases measuring up to 2.7 cm in size. No significant mass effect. 2. Question 4 mm left parietal osseous metastasis. 3. No evidence of metastatic disease in the cervical spine. Sclerotic metastasis in the left T3 transverse process. 4. Very mild cervical spondylosis without stenosis. These results were called by telephone at the time of interpretation on 10/10/2014 at 3:09 pm to [facility] , who verbally acknowledged these results.   Electronically Signed   By:  Logan Bores   On: 10/10/2014 15:10   Mr Cervical Spine W Wo Contrast  10/10/2014   CLINICAL DATA:  Neck pain and right arm weakness and numbness for 1 day. Personal history of metastatic lung cancer. Chemotherapy ongoing.  EXAM: MRI HEAD WITHOUT CONTRAST  MRI CERVICAL SPINE WITHOUT CONTRAST  TECHNIQUE: Multiplanar, multiecho pulse sequences of the brain and surrounding structures, and cervical spine, to include the craniocervical junction and cervicothoracic junction, were obtained without intravenous contrast.  COMPARISON:  None.  FINDINGS: MRI HEAD FINDINGS  There is no evidence acute infarct, midline shift, or extra-axial fluid collection. Ventricles and sulci are within normal limits for age.  There are numerous (greater than 40) supratentorial and infratentorial enhancing brain lesions. The largest cerebellar lesion is located in the left hemisphere and measures 2.5 x 1.7 cm (series 20 image 18) with small  amount of chronic blood products, mild surrounding edema, and no significant mass effect. Left temporal lesions measure 2.3 x 2.0 cm (series 20, image 19) and 2.0 x 2.0 cm (series 20, image 22), with mild surrounding edema. An index anterior right temporal lesion measures 2.7 x 1.8 cm (series 20, image 21) without significant edema. Index left frontal lobe lesion measures 2.2 x 2.0 cm (series 20, image 38), with a small amount of surrounding edema without mass effect. There is a 7 mm lesion in the right thalamus (series 20, image 30).  A 4 mm enhancing osseous metastasis is questioned in the left parietal bone (series 20, image 44). Orbits are unremarkable. Minimal mucosal thickening is noted in the maxillary sinuses. Mastoid air cells are clear. Major intracranial vascular flow voids are preserved.  MRI CERVICAL SPINE FINDINGS  Vertebral alignment is within normal limits. Cervical vertebral bone marrow signal is within normal limits without evidence of edema or enhancing lesions. There is a 9 mm T1 and T2  hypointense, nonenhancing focus in the left T3 transverse process corresponding to a sclerotic metastasis on recent chest CT. The cervical spinal cord is normal in caliber and signal. There is no evidence of epidural tumor. Multilevel disc desiccation is present in the cervical spine without significant disc space height loss. Paraspinal soft tissues are unremarkable.  C2-3:  Negative.  C3-4:  Mild uncovertebral spurring without stenosis.  C4-5:  Mild disc bulging without stenosis.  C5-6:  Negative.  C6-7:  Negative.  C7-T1:  Negative.  IMPRESSION: 1. Greater than 40 brain metastases measuring up to 2.7 cm in size. No significant mass effect. 2. Question 4 mm left parietal osseous metastasis. 3. No evidence of metastatic disease in the cervical spine. Sclerotic metastasis in the left T3 transverse process. 4. Very mild cervical spondylosis without stenosis. These results were called by telephone at the time of interpretation on 10/10/2014 at 3:09 pm to [facility] , who verbally acknowledged these results.   Electronically Signed   By: Logan Bores   On: 10/10/2014 15:10   Ct Abdomen Pelvis W Contrast  10/04/2014   CLINICAL DATA:  Lung cancer diagnosed 10 months ago. Chemotherapy ongoing. Subsequent encounter.  EXAM: CT CHEST, ABDOMEN, AND PELVIS WITH CONTRAST  TECHNIQUE: Multidetector CT imaging of the chest, abdomen and pelvis was performed following the standard protocol during bolus administration of intravenous contrast.  CONTRAST:  115mL OMNIPAQUE IOHEXOL 300 MG/ML  SOLN  COMPARISON:  Prior examinations 06/28/2014 and 04/04/2014.  FINDINGS: CT CHEST FINDINGS  Mediastinum/Nodes: Several mediastinal lymph nodes have enlarged compared with the prior study. There is a 10 mm right paratracheal node on image 14, a 7 mm precarinal node on image 21, a 10 mm subcarinal node on image 27 and a 14 mm left hilar node on image 26. The thyroid gland, trachea and esophagus demonstrate no significant findings. A small  hiatal hernia appears stable. The heart size is stable. There is no pericardial effusion.There are no significant vascular findings.  Lungs/Pleura: There is no pleural effusion.As above, there is an enlarging left hilar mass with associated increased narrowing of the left upper lobe bronchus. There is increased postobstructive pneumonitis or radiation change medially in the left lung. Multiple enlarging pulmonary nodules are present bilaterally consistent with metastatic disease. The largest nodule is in the right lower lobe, measuring 7 mm on image 45.  Musculoskeletal/Chest wall: Numerous sclerotic osseous metastases are demonstrated throughout the a sternum, spine and ribs. These are mostly stable, although 1 in  the right aspect of the T6 vertebral body has slightly progressed. There is no lytic lesion, pathologic fracture or epidural tumor.  CT ABDOMEN AND PELVIS FINDINGS  Hepatobiliary: There are stable small low-density hepatic lesions on images 51, 58 and 61, likely benign based on stability. No new or enlarging lesions identified. No evidence of gallstones, gallbladder wall thickening or biliary dilatation.  Pancreas: Unremarkable. No pancreatic ductal dilatation or surrounding inflammatory changes.  Spleen: Complex low-density lesion anteriorly in the spleen has enlarged, measuring 2.7 cm on image 60. The spleen otherwise appears unremarkable.  Adrenals/Urinary Tract: Both adrenal glands appear normal.The right kidney appears normal. There are stable small nonobstructing left renal calculi and cysts. There is no hydronephrosis. The bladder appears normal.  Stomach/Bowel: No evidence of bowel wall thickening, distention or surrounding inflammatory change.The appendix appears normal. There is no ascites or peritoneal nodularity. Moderate stool is present throughout the colon.  Vascular/Lymphatic: There are no enlarged abdominal or pelvic lymph nodes. No significant vascular findings are present.   Reproductive: Unremarkable.  Other: No evidence of abdominal wall mass or hernia.  Musculoskeletal: Multifocal sclerotic metastases are again noted within the lumbar spine, pelvis and proximal femurs. There is more lucency within the L4 vertebral body, but no pathologic fracture or epidural tumor.  IMPRESSION: 1. Thoracic recurrence with enlargement of multiple mediastinal lymph nodes, a recurrent left hilar mass and multiple pulmonary nodules. 2. Enlarging irregular low-density lesion anteriorly within the spleen, likely a metastasis. No other evidence of intra-abdominal metastatic disease. 3. Widespread blastic metastatic disease is similar to the prior study, although a few lesions demonstrate some progression. No evidence of pathologic fracture or epidural tumor.   Electronically Signed   By: Camie Patience M.D.   On: 10/04/2014 10:21       IMPRESSION:  The patient has stage IV adenocarcinoma of the lung with brain metastasis. He additionally has other sites of metastatic disease including widespread blastic metastasis and a tumor nodule within the anterior spleen. The patient is having some new onset numbness in the right upper extremity but overall his performance status remains quite good. He does have significant pain in the posterior right shoulder and left flank. I believe that he is appropriate for palliative radiation treatment. I discussed with him proceeding with whole brain radiation treatment in addition to concurrent palliative radiation treatment to the right shoulder and left flank/spleen metastasis.  I discussed with the patient the rationale of such treatment. We discussed possible symptom improvement as well as improvement in local control. We also discussed the possible side effects and risks of treatment of radiation to each of the sites. All of his questions were answered. The patient does wish to proceed with treatment.   PLAN: The patient will proceed with a simulation as soon as  possible. I as noted above anticipate treating the patient to 3 sites or approximately 3 weeks: Whole brain radiation treatment, right shoulder/lateral scapula, splenic metastasis.    I spent 60 minutes face to face with the patient and more than 50% of that time was spent in counseling and/or coordination of care.    ________________________________   Jodelle Gross, MD, PhD   **Disclaimer: This note was dictated with voice recognition software. Similar sounding words can inadvertently be transcribed and this note may contain transcription errors which may not have been corrected upon publication of note.**

## 2014-10-12 NOTE — Progress Notes (Signed)
Please see the Nurse Progress Note in the MD Initial Consult Encounter for this patient. 

## 2014-10-13 NOTE — Addendum Note (Signed)
Encounter addended by: Rebecca Eaton, RN on: 10/13/2014  8:00 AM<BR>     Documentation filed: Charges VN

## 2014-10-14 ENCOUNTER — Ambulatory Visit
Admission: RE | Admit: 2014-10-14 | Discharge: 2014-10-14 | Disposition: A | Discharge status: Home / Self Care | Payer: Self-pay | Source: Ambulatory Visit | Attending: Radiation Oncology | Admitting: Radiation Oncology

## 2014-10-14 DIAGNOSIS — C801 Malignant (primary) neoplasm, unspecified: Secondary | ICD-10-CM

## 2014-10-14 DIAGNOSIS — C7931 Secondary malignant neoplasm of brain: Secondary | ICD-10-CM

## 2014-10-19 ENCOUNTER — Ambulatory Visit
Admission: RE | Admit: 2014-10-19 | Discharge: 2014-10-19 | Disposition: A | Discharge status: Home / Self Care | Payer: Self-pay | Source: Ambulatory Visit | Attending: Radiation Oncology | Admitting: Radiation Oncology

## 2014-10-19 DIAGNOSIS — C7931 Secondary malignant neoplasm of brain: Secondary | ICD-10-CM

## 2014-10-19 NOTE — Progress Notes (Signed)
   Department of Radiation Oncology  Phone:  [phone] Fax:        [phone]  Weekly Treatment Note    Name: [name] Date: 10/19/2014 MRN: [id number] DOB: 1971-05-20   Current dose: 2.5 Gy  Current fraction:  1   MEDICATIONS: Current Outpatient Prescriptions  Medication Sig Dispense Refill  . acetaminophen (TYLENOL) 325 MG tablet Take 650 mg by mouth every 6 (six) hours as needed.    Marland Kitchen afatinib dimaleate (GILOTRIF) 40 MG tablet Take 1 tablet (40 mg total) by mouth daily. Take on an empty stomach 1hr before or 2 hrs after meals. 30 tablet 0  . albuterol (PROVENTIL HFA;VENTOLIN HFA) 108 (90 BASE) MCG/ACT inhaler Inhale 2 puffs into the lungs every 6 (six) hours as needed for wheezing or shortness of breath. 1 Inhaler 1  . benzonatate (TESSALON) 100 MG capsule Take 1 to 2 capsules by mouth every 8 hours as needed for cough 45 capsule 2  . chlorpheniramine-HYDROcodone (TUSSIONEX PENNKINETIC ER) 10-8 MG/5ML LQCR Take 5 mLs by mouth every 12 (twelve) hours as needed for cough. 1 Bottle 0  . dexamethasone (DECADRON) 4 MG tablet Take 1 tablet (4 mg total) by mouth 4 (four) times daily. 40 tablet 1  . doxycycline (VIBRAMYCIN) 100 MG capsule Take 1 capsule (100 mg total) by mouth 2 (two) times daily. 60 capsule 1  . fluticasone (FLONASE) 50 MCG/ACT nasal spray Place 2 sprays into both nostrils daily. 16 g 6  . guaiFENesin (MUCINEX) 600 MG 12 hr tablet Take by mouth 2 (two) times daily.    Marland Kitchen loratadine (CLARITIN) 10 MG tablet Take 1 tablet (10 mg total) by mouth daily. (Patient not taking: Reported on 10/12/2014) 30 tablet 11  . oxyCODONE (OXY IR/ROXICODONE) 5 MG immediate release tablet Take 1-2 tablets (5-10 mg total) by mouth every 6 (six) hours as needed for severe pain. 30 tablet 0   No current facility-administered medications for this encounter.     ALLERGIES: Amoxicillin-pot clavulanate and Penicillins   LABORATORY DATA:  Lab Results  Component Value Date   WBC 5.3  10/04/2014   HGB 16.4 10/04/2014   HCT 47.3 10/04/2014   MCV 84.8 10/04/2014   PLT 252 10/04/2014   Lab Results  Component Value Date   NA 142 10/04/2014   K 4.0 10/04/2014   CL 104 11/18/2013   CO2 27 10/04/2014   Lab Results  Component Value Date   ALT 35 10/04/2014   AST 26 10/04/2014   ALKPHOS 125 10/04/2014   BILITOT 0.95 10/04/2014     NARRATIVE: Chad George was seen today for weekly treatment management. The chart was checked and the patient's films were reviewed.  The patient is doing very well after his first treatment. The patient denies any difficulties thus far with his treatment. He does have some difficulty sleeping which is likely secondary to his taking Decadron. We discussed that we would begin tapering his Decadron as he proceeds through treatment.  PHYSICAL EXAMINATION: vitals were not taken for this visit.     alert, in no acute distress  ASSESSMENT: The patient is doing satisfactorily with treatment.  PLAN: We will continue with the patient's radiation treatment as planned.

## 2014-10-20 ENCOUNTER — Ambulatory Visit
Admission: RE | Admit: 2014-10-20 | Discharge: 2014-10-20 | Disposition: A | Discharge status: Home / Self Care | Payer: Self-pay | Source: Ambulatory Visit | Attending: Radiation Oncology | Admitting: Radiation Oncology

## 2014-10-21 ENCOUNTER — Ambulatory Visit
Admission: RE | Admit: 2014-10-21 | Discharge: 2014-10-21 | Disposition: A | Discharge status: Home / Self Care | Payer: Self-pay | Source: Ambulatory Visit | Attending: Radiation Oncology | Admitting: Radiation Oncology

## 2014-10-23 NOTE — Progress Notes (Signed)
  Radiation Oncology         [phone] ________________________________  Name: Chad George  MRN: [id number]  Date: 10/14/2014  DOB: October 23, 1970  SIMULATION AND TREATMENT PLANNING NOTE    ICD-9-CM ICD-10-CM   1. 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  Identity was confirmed.  All relevant records and images related to the planned course of therapy were reviewed.  The  written consent to proceed with treatment after reviewing the details related to the planned course of therapy was confirmed. The patient was set-up in a stable reproducible  supine position for radiation therapy.  CT images were obtained.  Surface markings were placed.  The CT images were loaded into the planning software.  A thermoplastic mask was constructed for whole brain radiotherapy. Then, 3 isocenters were established for treatment of the whole brain, shoulder, and spleen metastasis.  PLAN:  Planning will be completed by Dr. Lisbeth Renshaw in terms of creation of multileaf collimators and radiation prescription.  ________________________________  Sheral Apley Tammi Klippel, M.D.

## 2014-10-24 ENCOUNTER — Ambulatory Visit
Admission: RE | Admit: 2014-10-24 | Discharge: 2014-10-24 | Disposition: A | Discharge status: Home / Self Care | Payer: Self-pay | Source: Ambulatory Visit | Attending: Radiation Oncology | Admitting: Radiation Oncology

## 2014-10-25 ENCOUNTER — Ambulatory Visit
Admission: RE | Admit: 2014-10-25 | Discharge: 2014-10-25 | Disposition: A | Discharge status: Home / Self Care | Payer: Self-pay | Source: Ambulatory Visit | Attending: Radiation Oncology | Admitting: Radiation Oncology

## 2014-10-26 ENCOUNTER — Ambulatory Visit
Admission: RE | Admit: 2014-10-26 | Discharge: 2014-10-26 | Disposition: A | Discharge status: Home / Self Care | Payer: Self-pay | Source: Ambulatory Visit | Attending: Radiation Oncology | Admitting: Radiation Oncology

## 2014-10-26 ENCOUNTER — Encounter: Payer: Self-pay | Admitting: Radiation Oncology

## 2014-10-26 VITALS — BP 110/45 | HR 80 | Temp 98.1°F | Resp 20 | Wt 151.7 lb

## 2014-10-26 DIAGNOSIS — Z7952 Long term (current) use of systemic steroids: Secondary | ICD-10-CM | POA: Insufficient documentation

## 2014-10-26 DIAGNOSIS — Z7951 Long term (current) use of inhaled steroids: Secondary | ICD-10-CM | POA: Insufficient documentation

## 2014-10-26 DIAGNOSIS — Z792 Long term (current) use of antibiotics: Secondary | ICD-10-CM | POA: Insufficient documentation

## 2014-10-26 DIAGNOSIS — Z79899 Other long term (current) drug therapy: Secondary | ICD-10-CM | POA: Insufficient documentation

## 2014-10-26 DIAGNOSIS — C7931 Secondary malignant neoplasm of brain: Secondary | ICD-10-CM | POA: Insufficient documentation

## 2014-10-26 DIAGNOSIS — C3490 Malignant neoplasm of unspecified part of unspecified bronchus or lung: Secondary | ICD-10-CM | POA: Insufficient documentation

## 2014-10-26 MED ORDER — RADIAPLEXRX EX GEL
Freq: Once | CUTANEOUS | Status: AC
  Administered 2014-10-26: 14:00:00 via TOPICAL

## 2014-10-26 MED ORDER — ONDANSETRON HCL 8 MG PO TABS: 8.0000 mg | ORAL_TABLET | Freq: Two times a day (BID) | ORAL | Status: DC | PRN

## 2014-10-26 NOTE — Progress Notes (Addendum)
Weekly rad whole brain, rt shoulder and l abd, 6/15 completd, c/o bloating gas, nausea, and diarrhea, ,needs RX zofran, he has taen one of his daughters stated it helped, lost 12 lbs in 1 wek, took orthostatic b/p., siting b/p=99/62, P=78, rr=20 ,T=98.1,  Standing b/p=110/78,P=80,  Takes decadron 4x day 4mg  tablet, and afatinib tab every am, patient education done, radiaplex given, radiation thrapy and you book, my businessc ard, discussed ways to manage side effects of nausea, diarrhea, skin irritation, pain, fatigue, loss hair, o, low fiber diet, no fresh vegs, or fresh fruit, 5-6 smaller meals, no milk products,, sugessted imodium ad for diarrhea, gas-ex prn for gas, appetite poor, can only drink a little when taking pills, states he gets bloated, use of baby shampoo, luke warm baths/shower, radiaplex on affected areas after rad and bedtime if skin is irritated or itching, verbal understanding, all questions answered, teach back 12:21 PM

## 2014-10-26 NOTE — Progress Notes (Signed)
Department of Radiation Oncology  Phone:  [phone] Fax:        [phone]  Weekly Treatment Note    Name: Chad George Date: 10/26/2014 MRN: [id number] DOB: Apr 24, 1971   Current dose: 15 Gy  Current fraction:  6   MEDICATIONS: Current Outpatient Prescriptions  Medication Sig Dispense Refill  . acetaminophen (TYLENOL) 325 MG tablet Take 650 mg by mouth every 6 (six) hours as needed.    Marland Kitchen afatinib dimaleate (GILOTRIF) 40 MG tablet Take 1 tablet (40 mg total) by mouth daily. Take on an empty stomach 1hr before or 2 hrs after meals. 30 tablet 0  . albuterol (PROVENTIL HFA;VENTOLIN HFA) 108 (90 BASE) MCG/ACT inhaler Inhale 2 puffs into the lungs every 6 (six) hours as needed for wheezing or shortness of breath. 1 Inhaler 1  . benzonatate (TESSALON) 100 MG capsule Take 1 to 2 capsules by mouth every 8 hours as needed for cough 45 capsule 2  . chlorpheniramine-HYDROcodone (TUSSIONEX PENNKINETIC ER) 10-8 MG/5ML LQCR Take 5 mLs by mouth every 12 (twelve) hours as needed for cough. 1 Bottle 0  . dexamethasone (DECADRON) 4 MG tablet Take 1 tablet (4 mg total) by mouth 4 (four) times daily. 40 tablet 1  . doxycycline (VIBRAMYCIN) 100 MG capsule Take 1 capsule (100 mg total) by mouth 2 (two) times daily. 60 capsule 1  . fluticasone (FLONASE) 50 MCG/ACT nasal spray Place 2 sprays into both nostrils daily. 16 g 6  . guaiFENesin (MUCINEX) 600 MG 12 hr tablet Take by mouth 2 (two) times daily.    . hyaluronate sodium (RADIAPLEXRX) GEL Apply 1 application topically 2 (two) times daily.    Marland Kitchen oxyCODONE (OXY IR/ROXICODONE) 5 MG immediate release tablet Take 1-2 tablets (5-10 mg total) by mouth every 6 (six) hours as needed for severe pain. 30 tablet 0  . loratadine (CLARITIN) 10 MG tablet Take 1 tablet (10 mg total) by mouth daily. (Patient not taking: Reported on 10/12/2014) 30 tablet 11  . ondansetron (ZOFRAN) 8 MG tablet Take 1 tablet (8 mg total) by mouth every 12 (twelve) hours as needed for  nausea or vomiting. 30 tablet 0   No current facility-administered medications for this encounter.     ALLERGIES: Amoxicillin-pot clavulanate and Penicillins   LABORATORY DATA:  Lab Results  Component Value Date   WBC 5.3 10/04/2014   HGB 16.4 10/04/2014   HCT 47.3 10/04/2014   MCV 84.8 10/04/2014   PLT 252 10/04/2014   Lab Results  Component Value Date   NA 142 10/04/2014   K 4.0 10/04/2014   CL 104 11/18/2013   CO2 27 10/04/2014   Lab Results  Component Value Date   ALT 35 10/04/2014   AST 26 10/04/2014   ALKPHOS 125 10/04/2014   BILITOT 0.95 10/04/2014     NARRATIVE: Chad George was seen today for weekly treatment management. The chart was checked and the patient's films were reviewed.   The patient complains of increased nausea and some bloating after meals. Nursing has discussed using the medication gas ex after meals. The patient has used Zofran from his daughter and he states that this does help his nausea. The patient is interested in a prescription for this. He has significant weight over the last week.  PHYSICAL EXAMINATION: weight is 151 lb 11.2 oz (68.811 kg). His oral temperature is 98.1 F (36.7 C). His blood pressure is 110/45 and his pulse is 80. His respiration is 20 and oxygen saturation is 100%.  ASSESSMENT: The patient is doing satisfactorily with treatment.  PLAN: We will continue with the patient's radiation treatment as planned.  a nutritional consult has been placed. The patient was given a prescription for Zofran. He will continue his current regimen of steroids. We will begin to taper this dose next week, lower than the current dose of 4 mg 4 times per day.

## 2014-10-27 ENCOUNTER — Ambulatory Visit
Admission: RE | Admit: 2014-10-27 | Discharge: 2014-10-27 | Disposition: A | Discharge status: Home / Self Care | Payer: Self-pay | Source: Ambulatory Visit | Attending: Radiation Oncology | Admitting: Radiation Oncology

## 2014-10-28 ENCOUNTER — Ambulatory Visit
Admission: RE | Admit: 2014-10-28 | Discharge: 2014-10-28 | Disposition: A | Discharge status: Home / Self Care | Payer: Self-pay | Source: Ambulatory Visit | Attending: Radiation Oncology | Admitting: Radiation Oncology

## 2014-10-28 ENCOUNTER — Other Ambulatory Visit: Payer: Self-pay

## 2014-10-28 DIAGNOSIS — C801 Malignant (primary) neoplasm, unspecified: Secondary | ICD-10-CM

## 2014-10-28 DIAGNOSIS — C7931 Secondary malignant neoplasm of brain: Secondary | ICD-10-CM

## 2014-10-28 MED ORDER — DOXYCYCLINE HYCLATE 100 MG PO CAPS: 100.0000 mg | ORAL_CAPSULE | Freq: Two times a day (BID) | ORAL | Status: DC

## 2014-10-28 NOTE — Telephone Encounter (Signed)
Let pt know refill had been sent - receipt confirmed by pharmacy.  Pt voiced understanding.

## 2014-10-31 ENCOUNTER — Ambulatory Visit
Admission: RE | Admit: 2014-10-31 | Discharge: 2014-10-31 | Disposition: A | Discharge status: Home / Self Care | Payer: Self-pay | Source: Ambulatory Visit | Attending: Radiation Oncology | Admitting: Radiation Oncology

## 2014-11-01 ENCOUNTER — Ambulatory Visit
Admission: RE | Admit: 2014-11-01 | Discharge: 2014-11-01 | Disposition: A | Discharge status: Home / Self Care | Payer: Self-pay | Source: Ambulatory Visit | Attending: Radiation Oncology | Admitting: Radiation Oncology

## 2014-11-02 ENCOUNTER — Other Ambulatory Visit (HOSPITAL_BASED_OUTPATIENT_CLINIC_OR_DEPARTMENT_OTHER): Payer: Self-pay

## 2014-11-02 ENCOUNTER — Ambulatory Visit (HOSPITAL_BASED_OUTPATIENT_CLINIC_OR_DEPARTMENT_OTHER): Payer: Self-pay | Admitting: Physician Assistant

## 2014-11-02 ENCOUNTER — Other Ambulatory Visit: Payer: Self-pay | Admitting: *Deleted

## 2014-11-02 ENCOUNTER — Encounter: Payer: Self-pay | Admitting: Physician Assistant

## 2014-11-02 ENCOUNTER — Ambulatory Visit
Admission: RE | Admit: 2014-11-02 | Discharge: 2014-11-02 | Disposition: A | Discharge status: Home / Self Care | Payer: Self-pay | Source: Ambulatory Visit | Attending: Radiation Oncology | Admitting: Radiation Oncology

## 2014-11-02 ENCOUNTER — Telehealth: Payer: Self-pay | Admitting: Physician Assistant

## 2014-11-02 VITALS — BP 106/52 | HR 96 | Temp 98.9°F | Resp 18 | Ht 66.0 in | Wt 148.1 lb

## 2014-11-02 DIAGNOSIS — C3412 Malignant neoplasm of upper lobe, left bronchus or lung: Secondary | ICD-10-CM

## 2014-11-02 DIAGNOSIS — R197 Diarrhea, unspecified: Secondary | ICD-10-CM

## 2014-11-02 DIAGNOSIS — L03012 Cellulitis of left finger: Secondary | ICD-10-CM

## 2014-11-02 DIAGNOSIS — R21 Rash and other nonspecific skin eruption: Secondary | ICD-10-CM

## 2014-11-02 DIAGNOSIS — L03011 Cellulitis of right finger: Secondary | ICD-10-CM

## 2014-11-02 DIAGNOSIS — L03032 Cellulitis of left toe: Secondary | ICD-10-CM

## 2014-11-02 DIAGNOSIS — C3490 Malignant neoplasm of unspecified part of unspecified bronchus or lung: Secondary | ICD-10-CM

## 2014-11-02 DIAGNOSIS — G4700 Insomnia, unspecified: Secondary | ICD-10-CM

## 2014-11-02 DIAGNOSIS — C7931 Secondary malignant neoplasm of brain: Secondary | ICD-10-CM

## 2014-11-02 DIAGNOSIS — L03031 Cellulitis of right toe: Secondary | ICD-10-CM

## 2014-11-02 LAB — CBC WITH DIFFERENTIAL/PLATELET
BASO%: 0.1 % (ref 0.0–2.0)
Basophils Absolute: 0 10*3/uL (ref 0.0–0.1)
EOS%: 1.7 % (ref 0.0–7.0)
Eosinophils Absolute: 0.1 10*3/uL (ref 0.0–0.5)
HCT: 45.1 % (ref 38.4–49.9)
HGB: 15.1 g/dL (ref 13.0–17.1)
LYMPH%: 3.8 % — ABNORMAL LOW (ref 14.0–49.0)
MCH: 29 pg (ref 27.2–33.4)
MCHC: 33.5 g/dL (ref 32.0–36.0)
MCV: 86.5 fL (ref 79.3–98.0)
MONO#: 0.4 10*3/uL (ref 0.1–0.9)
MONO%: 4.3 % (ref 0.0–14.0)
NEUT#: 7.4 10*3/uL — ABNORMAL HIGH (ref 1.5–6.5)
NEUT%: 90.1 % — AB (ref 39.0–75.0)
Platelets: 147 10*3/uL (ref 140–400)
RBC: 5.22 10*6/uL (ref 4.20–5.82)
RDW: 13.3 % (ref 11.0–14.6)
WBC: 8.2 10*3/uL (ref 4.0–10.3)
lymph#: 0.3 10*3/uL — ABNORMAL LOW (ref 0.9–3.3)

## 2014-11-02 LAB — COMPREHENSIVE METABOLIC PANEL (CC13)
ALK PHOS: 113 U/L (ref 40–150)
ALT: 53 U/L (ref 0–55)
AST: 25 U/L (ref 5–34)
Albumin: 2.7 g/dL — ABNORMAL LOW (ref 3.5–5.0)
Anion Gap: 10 mEq/L (ref 3–11)
BUN: 16.6 mg/dL (ref 7.0–26.0)
CO2: 21 mEq/L — ABNORMAL LOW (ref 22–29)
Calcium: 8 mg/dL — ABNORMAL LOW (ref 8.4–10.4)
Chloride: 102 mEq/L (ref 98–109)
Creatinine: 0.7 mg/dL (ref 0.7–1.3)
EGFR: >90 mL/min/{1.73_m2} (ref >90)
GLUCOSE: 105 mg/dL (ref 70–140)
Potassium: 3.2 mEq/L — ABNORMAL LOW (ref 3.5–5.1)
Sodium: 133 mEq/L — ABNORMAL LOW (ref 136–145)
Total Bilirubin: 1.6 mg/dL — ABNORMAL HIGH (ref 0.20–1.20)
Total Protein: 5.1 g/dL — ABNORMAL LOW (ref 6.4–8.3)

## 2014-11-02 MED ORDER — AFATINIB DIMALEATE 40 MG PO TABS
40.0000 mg | ORAL_TABLET | Freq: Every day | ORAL | Status: DC
Start: 2014-11-02 — End: 2014-11-30

## 2014-11-02 MED ORDER — AFATINIB DIMALEATE 40 MG PO TABS: 40.0000 mg | ORAL_TABLET | Freq: Every day | ORAL | Status: DC

## 2014-11-02 MED ORDER — DIPHENOXYLATE-ATROPINE 2.5-0.025 MG PO TABS: 2.0000 | ORAL_TABLET | Freq: Four times a day (QID) | ORAL | Status: AC | PRN

## 2014-11-02 MED ORDER — POTASSIUM CHLORIDE CRYS ER 20 MEQ PO TBCR: 20.0000 meq | EXTENDED_RELEASE_TABLET | Freq: Two times a day (BID) | ORAL | Status: DC

## 2014-11-02 MED ORDER — TEMAZEPAM 15 MG PO CAPS: ORAL_CAPSULE | ORAL | Status: DC

## 2014-11-02 NOTE — Patient Instructions (Signed)
Take the Lomotil in addition to Imodium as instructed to control your diarrhea Continue to take doxycycline 100 mg by mouth twice daily Take Restoril 15 mg, 1 and on occasion if needed 2 capsules by mouth at bedtime to help you sleep Continue Gilotrif 40 mg by mouth daily Complete your radiation to the brain as scheduled Follow-up in one month

## 2014-11-02 NOTE — Telephone Encounter (Signed)
Pt confirmed labs/ov per 02/24 POF, gave pt AVS.... KJ  °

## 2014-11-02 NOTE — Progress Notes (Signed)
Hematology and Oncology Follow Up Visit  Chad George [id number] Aug 21, 1971 44 y.o. 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 He presented with left upper lobe lung mass measuring 5.4 x 4.7 x 3.7 cm and bilateral pulmonary nodules. Pathology indicating adenocarcinoma of a lung primary. 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  Prior Therapy: Tarceva 150 mg daily started in March of 2015. Placed on hold 12/14/13 due to rash. This was resumed again on 12/24/2013. Discontinued 10/05/2014 secondary to disease progression within the chest as well as multiple brain metastasis.   Current therapy:   1. Gilotrif (afatinib) 40 mg by mouth daily Therapy beginning approximately 10/05/2014. 2. Whole brain irradiation under the care of Dr. Lisbeth Renshaw expected to be completed on 11/08/2014  Interim History:  Chad George presents today for routine followup. He is accompanied today by his wife and 2 other family members. He is currently undergoing whole brain radiation under the care of Dr. Lisbeth Renshaw for greater than 40 brain metastatic lesions. He is completing his first month of treatment with [place] traffic (afatinib) 40 mg by mouth daily. He complains of having more episodes of diarrhea on this medication that he was having on Tarceva. He reports having multiple episodes of diarrhea despite taking the maximum dose of Imodium in a 24-hour period. He is also having significant skin lesions associated with a gel atrophic particularly on his scalp and face. However the scalp is affected to a greater extent.  He continues to have issues with paronychia involving both great toes and his thumbs.  He is reporting some increased left-sided chest pain at times also some cough. He does not report any shortness of breath or hemoptysis. He complains of  difficulty sleeping. His performance status is fair.   He is not reporting any headaches or blurry vision does not report any alteration of mental status psychiatric issues of depression. Is not reporting any chest pain or palpitation. Does not report any nausea or vomiting abdominal pain. Has not reported any hematochezia or melena. Has not reported any frequency urgency or hesitancy. Does not report any lymphadenopathy or petechiae. He does not report any skeletal pain.he does report paronychia affecting his toes and fingernails.  Remainder of his review of system is unremarkable.  Medications: I have reviewed the patient's current medications.  Current Outpatient Prescriptions  Medication Sig Dispense Refill  . acetaminophen (TYLENOL) 325 MG tablet Take 650 mg by mouth every 6 (six) hours as needed.    Marland Kitchen afatinib dimaleate (GILOTRIF) 40 MG tablet Take 1 tablet (40 mg total) by mouth daily. Take on an empty stomach 1hr before or 2 hrs after meals. 30 tablet 0  . albuterol (PROVENTIL HFA;VENTOLIN HFA) 108 (90 BASE) MCG/ACT inhaler Inhale 2 puffs into the lungs every 6 (six) hours as needed for wheezing or shortness of breath. 1 Inhaler 1  . benzonatate (TESSALON) 100 MG capsule Take 1 to 2 capsules by mouth every 8 hours as needed for cough 45 capsule 2  . chlorpheniramine-HYDROcodone (TUSSIONEX PENNKINETIC ER) 10-8 MG/5ML LQCR Take 5 mLs by mouth every 12 (twelve) hours as needed for cough. 1 Bottle 0  . dexamethasone (DECADRON) 4 MG tablet Take 1 tablet (4 mg total) by mouth 4 (four) times daily. 40 tablet 1  . doxycycline (VIBRAMYCIN) 100 MG capsule Take 1 capsule (100 mg total)  by mouth 2 (two) times daily. 60 capsule 1  . fluticasone (FLONASE) 50 MCG/ACT nasal spray Place 2 sprays into both nostrils daily. 16 g 6  . guaiFENesin (MUCINEX) 600 MG 12 hr tablet Take by mouth 2 (two) times daily.    . hyaluronate sodium (RADIAPLEXRX) GEL Apply 1 application topically 2 (two) times daily.    Marland Kitchen  loratadine (CLARITIN) 10 MG tablet Take 1 tablet (10 mg total) by mouth daily. 30 tablet 11  . ondansetron (ZOFRAN) 8 MG tablet Take 1 tablet (8 mg total) by mouth every 12 (twelve) hours as needed for nausea or vomiting. 30 tablet 0  . oxyCODONE (OXY IR/ROXICODONE) 5 MG immediate release tablet Take 1-2 tablets (5-10 mg total) by mouth every 6 (six) hours as needed for severe pain. 30 tablet 0  . diphenoxylate-atropine (LOMOTIL) 2.5-0.025 MG per tablet Take 2 tablets by mouth 4 (four) times daily as needed for diarrhea or loose stools. 30 tablet 1  . potassium chloride SA (K-DUR,KLOR-CON) 20 MEQ tablet Take 1 tablet (20 mEq total) by mouth 2 (two) times daily. 7 tablet 0  . temazepam (RESTORIL) 15 MG capsule Take 1 or 2 capsules by mouth at bed time for sleep 40 capsule 0   No current facility-administered medications for this visit.     Allergies:  Allergies  Allergen Reactions  . Amoxicillin-Pot Clavulanate   . Penicillins     REACTION: RASH    Past Medical History, Surgical history, Social history, and Family History were reviewed and updated.    Physical Exam: Blood pressure 106/52, pulse 96, temperature 98.9 F (37.2 C), temperature source Oral, resp. rate 18, height $RemoveBe'5\' 6"'[id number]$  (1.676 m), weight 148 lb 1.6 oz (67.178 kg), SpO2 100 %. ECOG: 0 General appearance: alert and cooperative not in any distress. Head: Normocephalic, no masses or lesions. Neck: no adenopathy, no thyroid masses. Lymph nodes: Cervical, supraclavicular, and axillary nodes normal. Heart:regular rate and rhythm, S1, S2 normal, no murmur, click, rub or gallop Lung:bilateral basilar rales heard. No wheezing or stridor.  Abdomen: soft, non-tender, without masses or organomegaly EXT:no erythema, induration, or nodules Skin: Diffuse acneiform rash on the face chest and back have decreased in intensity without any erythema or induration. No evidence of  Slight erythema noted at the tips of his fingers.  Grade 2   acneform skin eruptions primarily affecting the face and scalp, no evidence of superinfection, however there are several very " angry' looking eruptions scattered over the scalp. Paronychia involving the fingers and toes. Still persistent without any erythema or induration.  Lab Results: Lab Results  Component Value Date   WBC 8.2 11/02/2014   HGB 15.1 11/02/2014   HCT 45.1 11/02/2014   MCV 86.5 11/02/2014   PLT 147 11/02/2014     Chemistry      Component Value Date/Time   NA 133* 11/02/2014 0821   NA 141 11/18/2013 0457   K 3.2* 11/02/2014 0821   K 3.4* 11/18/2013 0457   CL 104 11/18/2013 0457   CO2 21* 11/02/2014 0821   CO2 22 11/18/2013 0457   BUN 16.6 11/02/2014 0821   BUN 15 11/18/2013 0457   CREATININE 0.7 11/02/2014 0821   CREATININE 0.82 11/18/2013 0457   CREATININE 0.69 08/16/2013 1727      Component Value Date/Time   CALCIUM 8.0* 11/02/2014 0821   CALCIUM 9.1 11/18/2013 0457   ALKPHOS 113 11/02/2014 0821   ALKPHOS 124* 11/16/2013 0630   AST 25 11/02/2014 0821   AST 18  11/16/2013 0630   ALT 53 11/02/2014 0821   ALT 17 11/16/2013 0630   BILITOT 1.60* 11/02/2014 0821   BILITOT 0.6 11/16/2013 0630     EXAM: CT CHEST, ABDOMEN, AND PELVIS WITH CONTRAST  TECHNIQUE: Multidetector CT imaging of the chest, abdomen and pelvis was performed following the standard protocol during bolus administration of intravenous contrast.  CONTRAST: 11mL OMNIPAQUE IOHEXOL 300 MG/ML SOLN  COMPARISON: Prior examinations 06/28/2014 and 04/04/2014.  FINDINGS: CT CHEST FINDINGS  Mediastinum/Nodes: Several mediastinal lymph nodes have enlarged compared with the prior study. There is a 10 mm right paratracheal node on image 14, a 7 mm precarinal node on image 21, a 10 mm subcarinal node on image 27 and a 14 mm left hilar node on image 26. The thyroid gland, trachea and esophagus demonstrate no significant findings. A small hiatal hernia appears stable. The heart size  is stable. There is no pericardial effusion.There are no significant vascular findings.  Lungs/Pleura: There is no pleural effusion.As above, there is an enlarging left hilar mass with associated increased narrowing of the left upper lobe bronchus. There is increased postobstructive pneumonitis or radiation change medially in the left lung. Multiple enlarging pulmonary nodules are present bilaterally consistent with metastatic disease. The largest nodule is in the right lower lobe, measuring 7 mm on image 45.  Musculoskeletal/Chest wall: Numerous sclerotic osseous metastases are demonstrated throughout the a sternum, spine and ribs. These are mostly stable, although 1 in the right aspect of the T6 vertebral body has slightly progressed. There is no lytic lesion, pathologic fracture or epidural tumor.  CT ABDOMEN AND PELVIS FINDINGS  Hepatobiliary: There are stable small low-density hepatic lesions on images 51, 58 and 61, likely benign based on stability. No new or enlarging lesions identified. No evidence of gallstones, gallbladder wall thickening or biliary dilatation.  Pancreas: Unremarkable. No pancreatic ductal dilatation or surrounding inflammatory changes.  Spleen: Complex low-density lesion anteriorly in the spleen has enlarged, measuring 2.7 cm on image 60. The spleen otherwise appears unremarkable.  Adrenals/Urinary Tract: Both adrenal glands appear normal.The right kidney appears normal. There are stable small nonobstructing left renal calculi and cysts. There is no hydronephrosis. The bladder appears normal.  Stomach/Bowel: No evidence of bowel wall thickening, distention or surrounding inflammatory change.The appendix appears normal. There is no ascites or peritoneal nodularity. Moderate stool is present throughout the colon.  Vascular/Lymphatic: There are no enlarged abdominal or pelvic lymph nodes. No significant vascular findings are  present.  Reproductive: Unremarkable.  Other: No evidence of abdominal wall mass or hernia.  Musculoskeletal: Multifocal sclerotic metastases are again noted within the lumbar spine, pelvis and proximal femurs. There is more lucency within the L4 vertebral body, but no pathologic fracture or epidural tumor.  IMPRESSION: 1. Thoracic recurrence with enlargement of multiple mediastinal lymph nodes, a recurrent left hilar mass and multiple pulmonary nodules. 2. Enlarging irregular low-density lesion anteriorly within the spleen, likely a metastasis. No other evidence of intra-abdominal metastatic disease. 3. Widespread blastic metastatic disease is similar to the prior study, although a few lesions demonstrate some progression. No evidence of pathologic fracture or epidural tumor.    Impression and Plan:   44 year-old gentleman with the following issues:  1. Stage IV adenocarcinoma of the lung with a left lung mass and bilateral pulmonary nodules. His tumor is EGFR mutated but the ALK mutation was not detected. He is currently on Tarceva since his diagnosis with excellent response up to this point. CT scan on 10/04/2014 was discussed  today and showed progression of disease. His parenchymal metastasis as well as lymph node metastasis have increased. There is also questionable splenic metastasis. Additionally he has greater than 40 brain metastasis, currently receiving whole brain radiation under the care of Dr. Lisbeth Renshaw. He is scheduled to complete whole brain radiation on 11/08/2014. Due to his disease progression his Tarceva therapy has been discontinued. His currently being treated with Gilotrif (afatinib) 40 mg by mouth once daily. He is tolerating the afatinib relatively well the exception of grade 2 skin rash and grade 1-2 diarrhea.  2.  Acneiform rash: As he is currently receiving whole brain radiation, he will not be able to use anything topically at this time. He is to continue  on doxycycline 100 mg by mouth twice daily.  4. Paronychia-likely related to Gilotrif therapy. Continue Epsom salt soaks, continue to monitor closely for signs or symptoms of infection.  5. Diarrhea: He was given a prescription for Lomotil to use in addition to Imodium to control his diarrhea.  6. Insomnia: Patient was given a prescription for Restoril 15 mg to be taken 1-2 capsules by mouth at bedtime.  7. Follow up:  In 4 to 5 weeks.   Awilda Metro E, PA-C  2/24/201610:38 AM

## 2014-11-03 ENCOUNTER — Ambulatory Visit
Admission: RE | Admit: 2014-11-03 | Discharge: 2014-11-03 | Disposition: A | Discharge status: Home / Self Care | Payer: Self-pay | Source: Ambulatory Visit | Attending: Radiation Oncology | Admitting: Radiation Oncology

## 2014-11-03 ENCOUNTER — Ambulatory Visit: Payer: Self-pay | Admitting: Nutrition

## 2014-11-03 NOTE — Progress Notes (Signed)
44 year old male diagnosed with stage IV lung cancer in March 2015, now with brain metastases.  Past medical history includes hypercholesterolemia and pneumonia.  Medications include Decadron and Zofran.  Labs were reviewed.  Height: 66 inches. Weight: 148.1 pounds. Usual body weight: 165 pounds. BMI: 23.92.  Patient reports increased nausea and vomiting. Patient has experienced increased gas. Patient describes very loose watery stools every morning. Patient takes immodium Patient endorses weight loss.  Nutrition diagnosis:  Unintended weight loss related to lung cancer and associated treatments as evidenced by 10% weight loss from usual body weight.  Intervention:  Patient educated to consume small frequent meals and snacks utilizing high protein foods. Educated patient on strategies for improving nausea and vomiting and provided a fact sheet on nausea and vomiting. Educated patient on foods to avoid to improve gas.  Fact sheet was provided. Reviewed low fiber diet with patient to improve diarrhea.  Encouraged patient to continue medications as prescribed.  Fact sheet provided. Encouraged patient to try juice-based oral nutrition supplements and provided samples. Wife provided with Spanish education for her review.  Patient reports he is comfortable with English copies of education. Questions were answered.  Teach back method used.  Monitoring, evaluation, goals:  Patient will tolerate increasing calories and protein to minimize further weight loss.  Next visit: I will follow patient as needed.  **Disclaimer: This note was dictated with voice recognition software. Similar sounding words can inadvertently be transcribed and this note may contain transcription errors which may not have been corrected upon publication of note.**

## 2014-11-04 ENCOUNTER — Ambulatory Visit
Admission: RE | Admit: 2014-11-04 | Discharge: 2014-11-04 | Disposition: A | Discharge status: Home / Self Care | Payer: Self-pay | Source: Ambulatory Visit | Attending: Radiation Oncology | Admitting: Radiation Oncology

## 2014-11-04 ENCOUNTER — Encounter: Payer: Self-pay | Admitting: Radiation Oncology

## 2014-11-04 VITALS — BP 115/76 | HR 92 | Temp 98.6°F | Resp 20 | Wt 143.6 lb

## 2014-11-04 DIAGNOSIS — C7931 Secondary malignant neoplasm of brain: Secondary | ICD-10-CM

## 2014-11-04 NOTE — Progress Notes (Signed)
Department of Radiation Oncology  Phone:  [phone] Fax:        [phone]  Weekly Treatment Note    Name: Chad George Date: 11/04/2014 MRN: [id number] DOB: [birth year]   Current dose: 32.5 Gy  Current fraction: 13   MEDICATIONS: Current Outpatient Prescriptions  Medication Sig Dispense Refill  . acetaminophen (TYLENOL) 325 MG tablet Take 650 mg by mouth every 6 (six) hours as needed.    Marland Kitchen afatinib dimaleate (GILOTRIF) 40 MG tablet Take 1 tablet (40 mg total) by mouth daily. Take on an empty stomach 1hr before or 2 hrs after meals. 30 tablet 0  . afatinib dimaleate (GILOTRIF) 40 MG tablet Take 1 tablet (40 mg total) by mouth daily. Take on an empty stomach 1hr before or 2 hrs after meals. 30 tablet 0  . albuterol (PROVENTIL HFA;VENTOLIN HFA) 108 (90 BASE) MCG/ACT inhaler Inhale 2 puffs into the lungs every 6 (six) hours as needed for wheezing or shortness of breath. 1 Inhaler 1  . benzonatate (TESSALON) 100 MG capsule Take 1 to 2 capsules by mouth every 8 hours as needed for cough 45 capsule 2  . chlorpheniramine-HYDROcodone (TUSSIONEX PENNKINETIC ER) 10-8 MG/5ML LQCR Take 5 mLs by mouth every 12 (twelve) hours as needed for cough. 1 Bottle 0  . dexamethasone (DECADRON) 4 MG tablet Take 1 tablet (4 mg total) by mouth 4 (four) times daily. 40 tablet 1  . diphenoxylate-atropine (LOMOTIL) 2.5-0.025 MG per tablet Take 2 tablets by mouth 4 (four) times daily as needed for diarrhea or loose stools. 30 tablet 1  . doxycycline (VIBRAMYCIN) 100 MG capsule Take 1 capsule (100 mg total) by mouth 2 (two) times daily. 60 capsule 1  . fluticasone (FLONASE) 50 MCG/ACT nasal spray Place 2 sprays into both nostrils daily. 16 g 6  . guaiFENesin (MUCINEX) 600 MG 12 hr tablet Take by mouth 2 (two) times daily.    . hyaluronate sodium (RADIAPLEXRX) GEL Apply 1 application topically 2 (two) times daily.    Marland Kitchen loratadine (CLARITIN) 10 MG tablet Take 1 tablet (10 mg total) by mouth daily. 30 tablet 11   . ondansetron (ZOFRAN) 8 MG tablet Take 1 tablet (8 mg total) by mouth every 12 (twelve) hours as needed for nausea or vomiting. 30 tablet 0  . oxyCODONE (OXY IR/ROXICODONE) 5 MG immediate release tablet Take 1-2 tablets (5-10 mg total) by mouth every 6 (six) hours as needed for severe pain. 30 tablet 0  . potassium chloride SA (K-DUR,KLOR-CON) 20 MEQ tablet Take 1 tablet (20 mEq total) by mouth 2 (two) times daily. 7 tablet 0  . temazepam (RESTORIL) 15 MG capsule Take 1 or 2 capsules by mouth at bed time for sleep 40 capsule 0   No current facility-administered medications for this encounter.     ALLERGIES: Amoxicillin-pot clavulanate and Penicillins   LABORATORY DATA:  Lab Results  Component Value Date   WBC 8.2 11/02/2014   HGB 15.1 11/02/2014   HCT 45.1 11/02/2014   MCV 86.5 11/02/2014   PLT 147 11/02/2014   Lab Results  Component Value Date   NA 133* 11/02/2014   K 3.2* 11/02/2014   CL 104 11/18/2013   CO2 21* 11/02/2014   Lab Results  Component Value Date   ALT 53 11/02/2014   AST 25 11/02/2014   ALKPHOS 113 11/02/2014   BILITOT 1.60* 11/02/2014     NARRATIVE: Chad George was seen today for weekly treatment management. The chart was checked and the patient's films were  reviewed.  Weekly rad txs rwhole brain, right shoulder,spleen,L abd 13 complotted   Taking lomotil now for diarrhea, head has dry desquamation , using biafine cream tid, sleepy today took sleeping medication at midnight , c/o left rib pain occasionally now x 1 week, sharp pain when turning , none at present, no c/o nausea, no gas pains, is fatigued, appetite good, drinking fluids  The patient states that his pain in the left flank/rib area is in the same area which we are treating currently which we have discussed. No new significant areas of pain.  PHYSICAL EXAMINATION: weight is 143 lb 9.6 oz (65.137 kg). His oral temperature is 98.6 F (37 C). His blood pressure is 115/76 and his pulse is 92. His  respiration is 20 and oxygen saturation is 100%.      the patient has diffuse dermatitis in the cranial region, consistent with systemic treatment potentiated by radiation treatment to this area.  ASSESSMENT: The patient is doing satisfactorily with treatment.  I believe the patient skin should heal adequately after we finished treatment. No moist desquamation currently.  PLAN: We will continue with the patient's radiation treatment as planned.

## 2014-11-04 NOTE — Progress Notes (Signed)
Weekly rad txs rwhole brain, right shoulder,spleen,L abd 13 complotted   Taking lomotil now for diarrhea, head has dry desquamation , using biafine cream tid, sleepy today took sleeping medication at midnight , c/o left rib pain occasionally now x 1 week, sharp pain when turning , none at present, no c/o nausea, no gas pains, is fatigued, appetite good, drinking fluids 11:30 AM

## 2014-11-07 ENCOUNTER — Ambulatory Visit
Admission: RE | Admit: 2014-11-07 | Discharge: 2014-11-07 | Disposition: A | Discharge status: Home / Self Care | Payer: Self-pay | Source: Ambulatory Visit | Attending: Radiation Oncology | Admitting: Radiation Oncology

## 2014-11-07 NOTE — Addendum Note (Signed)
Encounter addended by: Jodelle Gross, MD on: 11/07/2014 11:22 AM<BR>     Documentation filed: Notes Section, Visit Diagnoses

## 2014-11-07 NOTE — Progress Notes (Signed)
  Radiation Oncology         [phone] ________________________________  Name: Chad George MRN: [id number]  Date: 10/14/2014  DOB: [birth year]  SIMULATION AND TREATMENT PLANNING NOTE  DIAGNOSIS:  Metastatic non-small cell lung cancer with brain metastasis  Site:   #1. Whole brain radiation treatment #2 right shoulder #3 left abdomen/spleen  NARRATIVE:  The patient was brought to the [facility].  Identity was confirmed.  All relevant records and images related to the planned course of therapy were reviewed.   Written consent to proceed with treatment was confirmed which was freely given after reviewing the details related to the planned course of therapy had been reviewed with the patient.  Then, the patient was set-up in a stable reproducible  supine position for radiation therapy.  CT images were obtained.  Surface markings were placed.    Medically necessary complex treatment device(s) for immobilization:  Customized thermoplastic head cast.   The CT images were loaded into the planning software.  Then the target and avoidance structures were contoured.  Treatment planning then occurred.  The radiation prescription was entered and confirmed.  A total of 8 complex treatment devices were fabricated which relate to the designed radiation treatment fields: 4 customized fields for whole brain radiation treatment including reduced fields, AP PA fields to the right shoulder, AP PA fields to the left abdomen . Each of these customized fields/ complex treatment devices will be used on a daily basis during the radiation course. I have requested : .   PLAN:  The patient will receive 37.5 Gy in 15 fractions.  ________________________________   Jodelle Gross, MD, PhD

## 2014-11-08 ENCOUNTER — Ambulatory Visit
Admission: RE | Admit: 2014-11-08 | Discharge: 2014-11-08 | Disposition: A | Discharge status: Home / Self Care | Payer: Self-pay | Source: Ambulatory Visit | Attending: Radiation Oncology | Admitting: Radiation Oncology

## 2014-11-08 ENCOUNTER — Encounter: Payer: Self-pay | Admitting: Radiation Oncology

## 2014-11-11 ENCOUNTER — Telehealth: Payer: Self-pay | Admitting: *Deleted

## 2014-11-11 ENCOUNTER — Telehealth: Payer: Self-pay | Admitting: Nurse Practitioner

## 2014-11-11 NOTE — Telephone Encounter (Signed)
Chad George called to report acne on his scalp that is irritated, painful, has purulent drainage, and a bad odor.  He just completed RT on 11/08/14 and he has been taking Gilotrif for about two weeks.  Spoke with [employer].  She agreed to manage this patient over the phone per Dr. Alen Blew.

## 2014-11-11 NOTE — Telephone Encounter (Signed)
Patient called the cancer Center this morning to report that he continues with purulent acneiform-like rash to his entire scalp.  Patient states that he just completed whole brain irradiation this past Tuesday, 11/08/2014.  He continues to take Gilotrif oral therapy as previously directed.  Advised patient that the Gilotrif oral therapy is most likely causing the chronic rash.  Patient states that he has been taking the doxycycline as previously directed.  Patient states that he was wearing a knit cap on his head; and when he removed it he pulled some of the scabs off of his rash to his scalp.  Patient denies any fevers or chills.  Advised patient to take a shower; running warm soapy water over his scalp then patting dry gently.  He may also apply Neosporin and/or hydrocortisone over-the-counter to the lesions as well.  Also advised patient to call/return if symptoms persist or worsen whatsoever.  Patient stated understanding of all instructions; and was in agreement with this plan of care.

## 2014-11-22 ENCOUNTER — Telehealth: Payer: Self-pay | Admitting: *Deleted

## 2014-11-22 NOTE — Telephone Encounter (Signed)
Patient called stating he is still having nausea and has run out of his zofran would like a refill ,still having diarrheah 3-4x day and is taking his imodium after each stool, informed him to continue taking the imodium as we discussed during his treatments, has completed, informed him to eat bland foods, no citrus, that Dr.Moody was out of the office till tomorrow but I would e-mail and in basket him, could he wait till tomorrow for rx refill on the zofran,"yes, ", stated patient 9:59 AM

## 2014-11-23 ENCOUNTER — Other Ambulatory Visit (HOSPITAL_BASED_OUTPATIENT_CLINIC_OR_DEPARTMENT_OTHER): Payer: Self-pay

## 2014-11-23 ENCOUNTER — Telehealth: Payer: Self-pay | Admitting: *Deleted

## 2014-11-23 ENCOUNTER — Other Ambulatory Visit: Payer: Self-pay

## 2014-11-23 ENCOUNTER — Emergency Department (HOSPITAL_COMMUNITY)
Admission: EM | Admit: 2014-11-23 | Discharge: 2014-11-23 | Disposition: A | Discharge status: Home / Self Care | Payer: Self-pay | Attending: Emergency Medicine | Admitting: Emergency Medicine

## 2014-11-23 ENCOUNTER — Ambulatory Visit (HOSPITAL_BASED_OUTPATIENT_CLINIC_OR_DEPARTMENT_OTHER): Payer: Self-pay | Admitting: Nurse Practitioner

## 2014-11-23 ENCOUNTER — Emergency Department (HOSPITAL_COMMUNITY): Payer: Self-pay

## 2014-11-23 ENCOUNTER — Ambulatory Visit (HOSPITAL_BASED_OUTPATIENT_CLINIC_OR_DEPARTMENT_OTHER): Payer: Self-pay

## 2014-11-23 ENCOUNTER — Telehealth: Payer: Self-pay | Admitting: Nurse Practitioner

## 2014-11-23 ENCOUNTER — Encounter: Payer: Self-pay | Admitting: *Deleted

## 2014-11-23 ENCOUNTER — Other Ambulatory Visit: Payer: Self-pay | Admitting: Radiation Oncology

## 2014-11-23 VITALS — BP 103/69 | HR 102 | Temp 98.8°F | Resp 14 | Wt 137.9 lb

## 2014-11-23 DIAGNOSIS — C3492 Malignant neoplasm of unspecified part of left bronchus or lung: Secondary | ICD-10-CM | POA: Insufficient documentation

## 2014-11-23 DIAGNOSIS — K1230 Oral mucositis (ulcerative), unspecified: Secondary | ICD-10-CM

## 2014-11-23 DIAGNOSIS — R630 Anorexia: Secondary | ICD-10-CM

## 2014-11-23 DIAGNOSIS — Z8701 Personal history of pneumonia (recurrent): Secondary | ICD-10-CM | POA: Insufficient documentation

## 2014-11-23 DIAGNOSIS — R1310 Dysphagia, unspecified: Secondary | ICD-10-CM | POA: Insufficient documentation

## 2014-11-23 DIAGNOSIS — E860 Dehydration: Secondary | ICD-10-CM

## 2014-11-23 DIAGNOSIS — C3412 Malignant neoplasm of upper lobe, left bronchus or lung: Secondary | ICD-10-CM

## 2014-11-23 DIAGNOSIS — R21 Rash and other nonspecific skin eruption: Secondary | ICD-10-CM

## 2014-11-23 DIAGNOSIS — C3490 Malignant neoplasm of unspecified part of unspecified bronchus or lung: Secondary | ICD-10-CM

## 2014-11-23 DIAGNOSIS — L03032 Cellulitis of left toe: Secondary | ICD-10-CM

## 2014-11-23 DIAGNOSIS — E876 Hypokalemia: Secondary | ICD-10-CM

## 2014-11-23 DIAGNOSIS — Z792 Long term (current) use of antibiotics: Secondary | ICD-10-CM | POA: Insufficient documentation

## 2014-11-23 DIAGNOSIS — Z7951 Long term (current) use of inhaled steroids: Secondary | ICD-10-CM | POA: Insufficient documentation

## 2014-11-23 DIAGNOSIS — R531 Weakness: Secondary | ICD-10-CM | POA: Insufficient documentation

## 2014-11-23 DIAGNOSIS — C3491 Malignant neoplasm of unspecified part of right bronchus or lung: Secondary | ICD-10-CM

## 2014-11-23 DIAGNOSIS — Z79899 Other long term (current) drug therapy: Secondary | ICD-10-CM | POA: Insufficient documentation

## 2014-11-23 DIAGNOSIS — C7931 Secondary malignant neoplasm of brain: Secondary | ICD-10-CM | POA: Insufficient documentation

## 2014-11-23 DIAGNOSIS — R112 Nausea with vomiting, unspecified: Secondary | ICD-10-CM | POA: Insufficient documentation

## 2014-11-23 DIAGNOSIS — M791 Myalgia: Secondary | ICD-10-CM | POA: Insufficient documentation

## 2014-11-23 DIAGNOSIS — Z7952 Long term (current) use of systemic steroids: Secondary | ICD-10-CM | POA: Insufficient documentation

## 2014-11-23 DIAGNOSIS — R634 Abnormal weight loss: Secondary | ICD-10-CM

## 2014-11-23 DIAGNOSIS — C7951 Secondary malignant neoplasm of bone: Secondary | ICD-10-CM

## 2014-11-23 DIAGNOSIS — E8809 Other disorders of plasma-protein metabolism, not elsewhere classified: Secondary | ICD-10-CM

## 2014-11-23 DIAGNOSIS — R197 Diarrhea, unspecified: Secondary | ICD-10-CM | POA: Insufficient documentation

## 2014-11-23 DIAGNOSIS — Z880 Allergy status to penicillin: Secondary | ICD-10-CM | POA: Insufficient documentation

## 2014-11-23 LAB — I-STAT CHEM 8, ED
BUN: 22 mg/dL (ref 6–23)
CREATININE: 0.9 mg/dL (ref 0.50–1.35)
Calcium, Ion: 1.13 mmol/L (ref 1.12–1.23)
Chloride: 101 mmol/L (ref 96–112)
GLUCOSE: 118 mg/dL — AB (ref 70–99)
HCT: 40 % (ref 39.0–52.0)
HEMOGLOBIN: 13.6 g/dL (ref 13.0–17.0)
Potassium: 3.2 mmol/L — ABNORMAL LOW (ref 3.5–5.1)
SODIUM: 136 mmol/L (ref 135–145)
TCO2: 21 mmol/L (ref 0–100)

## 2014-11-23 LAB — COMPREHENSIVE METABOLIC PANEL (CC13)
ALK PHOS: 137 U/L (ref 40–150)
ALT: 57 U/L — AB (ref 0–55)
AST: 30 U/L (ref 5–34)
Albumin: 2.4 g/dL — ABNORMAL LOW (ref 3.5–5.0)
Anion Gap: 10 mEq/L (ref 3–11)
BILIRUBIN TOTAL: 0.94 mg/dL (ref 0.20–1.20)
BUN: 17.7 mg/dL (ref 7.0–26.0)
CO2: 21 mEq/L — ABNORMAL LOW (ref 22–29)
Calcium: 8.4 mg/dL (ref 8.4–10.4)
Chloride: 104 mEq/L (ref 98–109)
Creatinine: 0.8 mg/dL (ref 0.7–1.3)
EGFR: >90 mL/min/{1.73_m2} (ref >90)
Glucose: 131 mg/dl (ref 70–140)
Potassium: 3 mEq/L — CL (ref 3.5–5.1)
SODIUM: 135 meq/L — AB (ref 136–145)
TOTAL PROTEIN: 5.2 g/dL — AB (ref 6.4–8.3)

## 2014-11-23 LAB — CBC WITH DIFFERENTIAL/PLATELET
BASO%: 0.3 % (ref 0.0–2.0)
BASOS ABS: 0 10*3/uL (ref 0.0–0.1)
BASOS PCT: 0 % (ref 0–1)
Basophils Absolute: 0 10*3/uL (ref 0.0–0.1)
EOS%: 0.3 % (ref 0.0–7.0)
Eosinophils Absolute: 0 10*3/uL (ref 0.0–0.5)
Eosinophils Absolute: 0.1 10*3/uL (ref 0.0–0.7)
Eosinophils Relative: 0 % (ref 0–5)
HCT: 41 % (ref 38.4–49.9)
HEMATOCRIT: 38.2 % — AB (ref 39.0–52.0)
HGB: 14 g/dL (ref 13.0–17.1)
Hemoglobin: 13.6 g/dL (ref 13.0–17.0)
LYMPH#: 0.8 10*3/uL — AB (ref 0.9–3.3)
LYMPH%: 5.5 % — ABNORMAL LOW (ref 14.0–49.0)
Lymphocytes Relative: 7 % — ABNORMAL LOW (ref 12–46)
Lymphs Abs: 0.8 10*3/uL (ref 0.7–4.0)
MCH: 28.4 pg (ref 27.2–33.4)
MCH: 29.3 pg (ref 26.0–34.0)
MCHC: 34.2 g/dL (ref 32.0–36.0)
MCHC: 35.6 g/dL (ref 30.0–36.0)
MCV: 83 fL (ref 79.3–98.0)
MCV: 82.3 fL (ref 78.0–100.0)
MONO ABS: 0.8 10*3/uL (ref 0.1–1.0)
MONO#: 0.8 10*3/uL (ref 0.1–0.9)
MONO%: 5.5 % (ref 0.0–14.0)
Monocytes Relative: 6 % (ref 3–12)
NEUT#: 12.4 10*3/uL — ABNORMAL HIGH (ref 1.5–6.5)
NEUT%: 88.4 % — ABNORMAL HIGH (ref 39.0–75.0)
Neutro Abs: 10.3 10*3/uL — ABNORMAL HIGH (ref 1.7–7.7)
Neutrophils Relative %: 87 % — ABNORMAL HIGH (ref 43–77)
PLATELETS: 552 10*3/uL — AB (ref 140–400)
Platelets: 502 10*3/uL — ABNORMAL HIGH (ref 150–400)
RBC: 4.93 10*6/uL (ref 4.20–5.82)
RBC: 4.64 MIL/uL (ref 4.22–5.81)
RDW: 13.6 % (ref 11.0–14.6)
RDW: 13.1 % (ref 11.5–15.5)
WBC: 14 10*3/uL — AB (ref 4.0–10.3)
WBC: 11.9 10*3/uL — AB (ref 4.0–10.5)

## 2014-11-23 LAB — URINALYSIS, ROUTINE W REFLEX MICROSCOPIC
GLUCOSE, UA: NEGATIVE mg/dL
Ketones, ur: NEGATIVE mg/dL
LEUKOCYTES UA: NEGATIVE
Nitrite: NEGATIVE
PROTEIN: 30 mg/dL — AB
SPECIFIC GRAVITY, URINE: 1.026 (ref 1.005–1.030)
Urobilinogen, UA: 0.2 mg/dL (ref 0.0–1.0)
pH: 6 (ref 5.0–8.0)

## 2014-11-23 LAB — URINE MICROSCOPIC-ADD ON

## 2014-11-23 LAB — TROPONIN I

## 2014-11-23 LAB — I-STAT CG4 LACTIC ACID, ED: LACTIC ACID, VENOUS: 0.98 mmol/L (ref 0.5–2.0)

## 2014-11-23 LAB — MAGNESIUM: Magnesium: 1.8 mg/dL (ref 1.5–2.5)

## 2014-11-23 MED ORDER — ONDANSETRON HCL 8 MG PO TABS: 8.0000 mg | ORAL_TABLET | Freq: Once | ORAL | Status: DC

## 2014-11-23 MED ORDER — METOCLOPRAMIDE HCL 10 MG PO TABS: 10.0000 mg | ORAL_TABLET | Freq: Four times a day (QID) | ORAL | Status: AC | PRN

## 2014-11-23 MED ORDER — ONDANSETRON 8 MG PO TBDP: 8.0000 mg | ORAL_TABLET | Freq: Two times a day (BID) | ORAL | Status: DC | PRN

## 2014-11-23 MED ORDER — SODIUM CHLORIDE 0.9 % IV BOLUS (SEPSIS)
2000.0000 mL | Freq: Once | INTRAVENOUS | Status: AC
  Administered 2014-11-23: 2000 mL via INTRAVENOUS

## 2014-11-23 MED ORDER — SODIUM CHLORIDE 0.9 % IV SOLN: INTRAVENOUS | Status: DC

## 2014-11-23 MED ORDER — THIAMINE HCL 100 MG/ML IJ SOLN
100.0000 mg | Freq: Once | INTRAMUSCULAR | Status: AC
  Administered 2014-11-23: 100 mg via INTRAVENOUS
  Filled 2014-11-23: qty 2

## 2014-11-23 MED ORDER — SODIUM CHLORIDE 0.9 % IV SOLN
Freq: Once | INTRAVENOUS | Status: AC
  Administered 2014-11-23: 16:00:00 via INTRAVENOUS
  Filled 2014-11-23: qty 4

## 2014-11-23 MED ORDER — ONDANSETRON HCL 8 MG PO TABS: 8.0000 mg | ORAL_TABLET | Freq: Two times a day (BID) | ORAL | Status: DC | PRN

## 2014-11-23 MED ORDER — METOCLOPRAMIDE HCL 5 MG/ML IJ SOLN
10.0000 mg | Freq: Once | INTRAMUSCULAR | Status: AC
  Administered 2014-11-23: 10 mg via INTRAVENOUS
  Filled 2014-11-23: qty 2

## 2014-11-23 MED ORDER — POTASSIUM CHLORIDE CRYS ER 20 MEQ PO TBCR: 40.0000 meq | EXTENDED_RELEASE_TABLET | Freq: Once | ORAL | Status: DC

## 2014-11-23 MED ORDER — SODIUM CHLORIDE 0.9 % IV SOLN
INTRAVENOUS | Status: AC
  Administered 2014-11-23: 16:00:00 via INTRAVENOUS
  Filled 2014-11-23: qty 1000

## 2014-11-23 MED ORDER — POTASSIUM CHLORIDE 10 MEQ/100ML IV SOLN
10.0000 meq | Freq: Once | INTRAVENOUS | Status: AC
  Administered 2014-11-23: 10 meq via INTRAVENOUS
  Filled 2014-11-23: qty 100

## 2014-11-23 NOTE — ED Provider Notes (Signed)
CSN: [id number]     Arrival date & time 11/23/14  1817 History   First MD Initiated Contact with Patient 11/23/14 1909     Chief Complaint  Patient presents with  . ca pt, vomiting      (Consider location/radiation/quality/duration/timing/severity/associated sxs/prior Treatment) HPI Patient reports he received intravenous fluids today at [facility] this and subsequently developed vomiting. He was treated with Zofran presently he denies any nausea he complains of generalized weakness which she's had for the past 2 weeks. Denies fever. He does admit to diffuse bone pain. Denies abdominal pain denies headache no other complaint. Nothing makes symptoms better or worse. Past Medical History  Diagnosis Date  . High cholesterol   . Pneumonia   . Adenocarcinoma 11/21/2013  . Brain cancer 10/10/14 MRI    40 lesions mets  . Allergy    Past Surgical History  Procedure Laterality Date  . Carpal tunner    . Tendon repair    . Video bronchoscopy Bilateral 11/17/2013    Procedure: VIDEO BRONCHOSCOPY WITH FLUORO;  Surgeon: Rush Farmer, MD;  Location: [facility];  Service: Cardiopulmonary;  Laterality: Bilateral;  . Video bronchoscopy with endobronchial ultrasound N/A 11/19/2013    Procedure: VIDEO BRONCHOSCOPY WITH ENDOBRONCHIAL ULTRASOUND;  Surgeon: Collene Gobble, MD;  Location: [facility];  Service: Thoracic;  Laterality: N/A;   Family History  Problem Relation Age of Onset  . Stroke Father    History  Substance Use Topics  . Smoking status: Never Smoker   . Smokeless tobacco: Never Used  . Alcohol Use: No    Review of Systems  HENT: Positive for trouble swallowing.        Dysphagia for several days  Respiratory: Negative.   Cardiovascular: Negative.   Gastrointestinal: Positive for nausea, vomiting and diarrhea.       No nausea presently. Chronic diarrhea  Musculoskeletal: Positive for myalgias and arthralgias.  Skin: Positive for rash.       Rash on trunk and extremities and face  for the past 1.5 months since receiving chemotherapy and radiation  Neurological: Positive for weakness.       Generalized weakness  Psychiatric/Behavioral: Negative.   All other systems reviewed and are negative.     Allergies  Amoxicillin-pot clavulanate and Penicillins  Home Medications   Prior to Admission medications   Medication Sig Start Date End Date Taking? Authorizing Provider  acetaminophen (TYLENOL) 325 MG tablet Take 650 mg by mouth every 6 (six) hours as needed.    Historical Provider, MD  afatinib dimaleate (GILOTRIF) 40 MG tablet Take 1 tablet (40 mg total) by mouth daily. Take on an empty stomach 1hr before or 2 hrs after meals. 11/02/14   Wyatt Portela, MD  afatinib dimaleate (GILOTRIF) 40 MG tablet Take 1 tablet (40 mg total) by mouth daily. Take on an empty stomach 1hr before or 2 hrs after meals. 11/02/14   Wyatt Portela, MD  albuterol (PROVENTIL HFA;VENTOLIN HFA) 108 (90 BASE) MCG/ACT inhaler Inhale 2 puffs into the lungs every 6 (six) hours as needed for wheezing or shortness of breath. 06/30/14   Tresa Garter, MD  benzonatate (TESSALON) 100 MG capsule Take 1 to 2 capsules by mouth every 8 hours as needed for cough 03/10/14   Adrena E Johnson, PA-C  chlorpheniramine-HYDROcodone (TUSSIONEX PENNKINETIC ER) 10-8 MG/5ML LQCR Take 5 mLs by mouth every 12 (twelve) hours as needed for cough. 11/21/13   Kelvin Cellar, MD  dexamethasone (DECADRON) 4 MG tablet Take  1 tablet (4 mg total) by mouth 4 (four) times daily. 10/10/14   Susanne Borders, NP  diphenoxylate-atropine (LOMOTIL) 2.5-0.025 MG per tablet Take 2 tablets by mouth 4 (four) times daily as needed for diarrhea or loose stools. 11/02/14   Carlton Adam, PA-C  doxycycline (VIBRAMYCIN) 100 MG capsule Take 1 capsule (100 mg total) by mouth 2 (two) times daily. 10/28/14   Wyatt Portela, MD  fluticasone (FLONASE) 50 MCG/ACT nasal spray Place 2 sprays into both nostrils daily. 09/15/14   Tresa Garter, MD   guaiFENesin (MUCINEX) 600 MG 12 hr tablet Take by mouth 2 (two) times daily.    Historical Provider, MD  hyaluronate sodium (RADIAPLEXRX) GEL Apply 1 application topically 2 (two) times daily. 10/26/14   Kyung Rudd, MD  loratadine (CLARITIN) 10 MG tablet Take 1 tablet (10 mg total) by mouth daily. 10/16/13   Reyne Dumas, MD  ondansetron (ZOFRAN ODT) 8 MG disintegrating tablet Take 1 tablet (8 mg total) by mouth every 12 (twelve) hours as needed for nausea or vomiting. 11/23/14   Susanne Borders, NP  ondansetron (ZOFRAN) 8 MG tablet Take 1 tablet (8 mg total) by mouth every 12 (twelve) hours as needed for nausea or vomiting. 11/23/14   Kyung Rudd, MD  oxyCODONE (OXY IR/ROXICODONE) 5 MG immediate release tablet Take 1-2 tablets (5-10 mg total) by mouth every 6 (six) hours as needed for severe pain. 07/28/14   Carlton Adam, PA-C  potassium chloride SA (K-DUR,KLOR-CON) 20 MEQ tablet Take 1 tablet (20 mEq total) by mouth 2 (two) times daily. 11/02/14   Carlton Adam, PA-C  temazepam (RESTORIL) 15 MG capsule Take 1 or 2 capsules by mouth at bed time for sleep 11/02/14   Adrena E Johnson, PA-C   BP 106/69 mmHg  Pulse 90  Temp(Src) 98.1 F (36.7 C) (Oral)  Resp 16  SpO2 97% Physical Exam  Constitutional: He is oriented to person, place, and time. No distress.  Chronically ill-appearing  HENT:  Head: Normocephalic and atraumatic.  Mucous membranes dry, no mucosal lesion  Eyes: Conjunctivae are normal. Pupils are equal, round, and reactive to light.  Neck: Neck supple. No tracheal deviation present. No thyromegaly present.  Cardiovascular: Normal rate and regular rhythm.   No murmur heard. Pulmonary/Chest: Effort normal and breath sounds normal.  Abdominal: Soft. Bowel sounds are normal. He exhibits no distension. There is no tenderness.  Musculoskeletal: Normal range of motion. He exhibits no edema or tenderness.  Neurological: He is alert and oriented to person, place, and time. No cranial  nerve deficit. Coordination normal.  Skin: Skin is warm and dry. Rash noted.  Pinkish papular rash on trunk and extremities. Scalp and face are diffusely reddened  Psychiatric: He has a normal mood and affect.  Nursing note and vitals reviewed.   ED Course  Procedures (including critical care time) Labs Review Labs Reviewed  CBC WITH DIFFERENTIAL/PLATELET - Abnormal; Notable for the following:    WBC 11.9 (*)    HCT 38.2 (*)    Platelets 502 (*)    Neutrophils Relative % 87 (*)    Neutro Abs 10.3 (*)    Lymphocytes Relative 7 (*)    All other components within normal limits  I-STAT CHEM 8, ED - Abnormal; Notable for the following:    Potassium 3.2 (*)    Glucose, Bld 118 (*)    All other components within normal limits  URINALYSIS, ROUTINE W REFLEX MICROSCOPIC  I-STAT CG4 LACTIC ACID,  ED    Imaging Review No results found.   EKG Interpretation None     ED ECG REPORT   Date: 11/23/2014  Rate: 95  Rhythm: normal sinus rhythm  QRS Axis: normal  Intervals: normal  ST/T Wave abnormalities: nonspecific T wave changes  Conduction Disutrbances:none  Narrative Interpretation:   Old EKG Reviewed: Nonspecific T wave changes new over 11/15/2013 interpreted by me  I have personally reviewed the EKG tracing and agree with the computerized printout as noted.  825 p.m. patient vomited. Intravenous Reglan ordered. 11:25 PM patient feels much improved after treatment with intravenous fluid and antiemetics. He is able to drink without difficulty.   chest x-ray viewed by me Results for orders placed or performed during the hospital encounter of 11/23/14  Urinalysis with microscopic  Result Value Ref Range   Color, Urine AMBER (A) YELLOW   APPearance CLOUDY (A) CLEAR   Specific Gravity, Urine 1.026 1.005 - 1.030   pH 6.0 5.0 - 8.0   Glucose, UA NEGATIVE NEGATIVE mg/dL   Hgb urine dipstick LARGE (A) NEGATIVE   Bilirubin Urine SMALL (A) NEGATIVE   Ketones, ur NEGATIVE NEGATIVE  mg/dL   Protein, ur 30 (A) NEGATIVE mg/dL   Urobilinogen, UA 0.2 0.0 - 1.0 mg/dL   Nitrite NEGATIVE NEGATIVE   Leukocytes, UA NEGATIVE NEGATIVE  CBC with Differential  Result Value Ref Range   WBC 11.9 (H) 4.0 - 10.5 K/uL   RBC 4.64 4.22 - 5.81 MIL/uL   Hemoglobin 13.6 13.0 - 17.0 g/dL   HCT 38.2 (L) 39.0 - 52.0 %   MCV 82.3 78.0 - 100.0 fL   MCH 29.3 26.0 - 34.0 pg   MCHC 35.6 30.0 - 36.0 g/dL   RDW 13.1 11.5 - 15.5 %   Platelets 502 (H) 150 - 400 K/uL   Neutrophils Relative % 87 (H) 43 - 77 %   Neutro Abs 10.3 (H) 1.7 - 7.7 K/uL   Lymphocytes Relative 7 (L) 12 - 46 %   Lymphs Abs 0.8 0.7 - 4.0 K/uL   Monocytes Relative 6 3 - 12 %   Monocytes Absolute 0.8 0.1 - 1.0 K/uL   Eosinophils Relative 0 0 - 5 %   Eosinophils Absolute 0.1 0.0 - 0.7 K/uL   Basophils Relative 0 0 - 1 %   Basophils Absolute 0.0 0.0 - 0.1 K/uL  Magnesium  Result Value Ref Range   Magnesium 1.8 1.5 - 2.5 mg/dL  Troponin I  Result Value Ref Range   Troponin I <0.03 <0.031 ng/mL  Urine microscopic-add on  Result Value Ref Range   Squamous Epithelial / LPF RARE RARE   WBC, UA 0-2 <3 WBC/hpf   RBC / HPF TOO NUMEROUS TO COUNT <3 RBC/hpf   Urine-Other MUCOUS PRESENT   I-Stat CG4 Lactic Acid, ED  Result Value Ref Range   Lactic Acid, Venous 0.98 0.5 - 2.0 mmol/L  I-Stat Chem 8, ED  Result Value Ref Range   Sodium 136 135 - 145 mmol/L   Potassium 3.2 (L) 3.5 - 5.1 mmol/L   Chloride 101 96 - 112 mmol/L   BUN 22 6 - 23 mg/dL   Creatinine, Ser 0.90 0.50 - 1.35 mg/dL   Glucose, Bld 118 (H) 70 - 99 mg/dL   Calcium, Ion 1.13 1.12 - 1.23 mmol/L   TCO2 21 0 - 100 mmol/L   Hemoglobin 13.6 13.0 - 17.0 g/dL   HCT 40.0 39.0 - 52.0 %   Dg Chest 2 View  11/23/2014   CLINICAL DATA:  44 year old male with a history of pneumonia, adenocarcinoma, metastatic to brain.  EXAM: CHEST - 2 VIEW  COMPARISON:  CT chest 10/04/2014, plain film 11/18/2013  FINDINGS: Cardiomediastinal silhouette unchanged in size and contour.   Left hilar opacities, with linear opacities of the left lung. This is improved compared to the airspace disease on the chest x-ray dated 11/18/2013. This correlates with findings on the CT dated 10/04/2014 of left hilar mass.  Mixed nodular and reticular opacities of the retrocardiac region.  No confluent airspace disease.  No pleural effusion or pneumothorax.  Nodule of the right upper lobe, likely correlates to nodule on the prior CT.  No acute bony abnormality.  IMPRESSION: Nodular reticular opacities the retrocardiac region, similar to findings on prior CT of the chest. This may reflect carcinomatosis, or potentially a superimposed pneumonia.  Left hilar opacities, consistent with recurrent left hilar mass evident on prior CT.  Nodule of the right upper lobe, correlating no findings on prior CT.  Signed,  Dulcy Fanny. Earleen Newport, DO  [facility]  [facility]   Electronically Signed   By: Corrie Mckusick D.O.   On: 11/23/2014 19:50    MDM   doubt acute pneumonia. Patient denies cough denies shortness of breath. Plan keep scheduled appointment with her oncologist tomorrow. Diarrhea is chronic. Iwill write prescription for Reglan. Final diagnoses:  Weakness    diagnosis #1 nausea,vomiting diarrhea #2 dehydration #3 weakness #4 hypokalemia #5 microscopic hematuria     Orlie Dakin, MD 11/23/14 2340

## 2014-11-23 NOTE — Patient Instructions (Signed)

## 2014-11-23 NOTE — ED Notes (Signed)
Bed: WA19 Expected date:  Expected time:  Means of arrival:  Comments: triage 

## 2014-11-23 NOTE — ED Notes (Addendum)
Pt reports lung cancer that has spread to brain, ribs, and right arm. Pt takes daily PO chemo pill, last radiation dose 3 weeks ago. Pt was at cancer center today for fluids and potassium. Pt had finished receiving infusion and during discharge pt started vomiting. Pt has rash all over arms and trunk. Reports pain from rash. Weakness started 3 weeks ago but has increased, weakness the worst today. Denies cough, denies dysuria.   If pt is discharged IV to stay in because pt will follow up with cancer center tomorrow.

## 2014-11-23 NOTE — Telephone Encounter (Signed)
Spoke with patient he can pick up rx today, [facility] ,patient thanked Dr.Moody and this Rn for return call this am 8:45 AM

## 2014-11-23 NOTE — Telephone Encounter (Signed)
per pof to sch pt in [facility]-pt aware of time

## 2014-11-23 NOTE — ED Notes (Signed)
Instructed to leave his IV's in by the cancer center upon discharge of the patient.

## 2014-11-23 NOTE — Telephone Encounter (Signed)
Called [facility] [phone] with Si Raider ,pharmacist,  rx Zofran hadn't crossed over to their computer system as yet, called in rx zofran 8mg  1 q 12h prn n,v, #30 tabs no refill 11:08 AM

## 2014-11-23 NOTE — ED Notes (Signed)
Received report from [place], RN, pt in no acute distress, wife at bedside

## 2014-11-23 NOTE — Telephone Encounter (Signed)
error 

## 2014-11-23 NOTE — Telephone Encounter (Signed)
Patient called saying he can't swallow his food.  He has had 1/2 jar of baby food (sweet potatoes) and 1/2 cup of Pedialite today.  He has a had a little water.  He is also having diarrhea, which is grade 1 with 3-4 stools per day, and is using Imodium, but not at much as he could use.  He is concerned because it is so hard to swallow.  He agreed to come in to the [facility] this afternoon.

## 2014-11-23 NOTE — Telephone Encounter (Signed)
Chad George called back stating [facility]'t [facility] the Rx of Zofran asked to call in to [facility], called [phone], left voice message to give patient rx Zofran 8mg , 1 tab q 12hours prn n,v, #30 tabs total, no refills 11:50 AM

## 2014-11-23 NOTE — Progress Notes (Signed)
1810  Patient experiencing nausea and vomiting at discharge.  Patient transferred to ED.

## 2014-11-23 NOTE — Telephone Encounter (Signed)
Will call patient rx for Zofran e-scribed by MD 11/23/14

## 2014-11-23 NOTE — Discharge Instructions (Signed)
Dehydration, Adult Take the medication prescribed as needed for nausea or vomiting. Keep your scheduled appointment with Dr.Shadad tomorrow morning. You have microscopic amounts of blood in your urine. Your urine should be rechecked within 1 or 2 weeks. Return if you feel worse or unable to hold down fluids without vomiting after taking the medication prescribed. You can take the Zofran prescribed to U earlier in combination with the medication prescribed tonight if needed Dehydration is when you lose more fluids from the body than you take in. Vital organs like the kidneys, brain, and heart cannot function without a proper amount of fluids and salt. Any loss of fluids from the body can cause dehydration.  CAUSES   Vomiting.  Diarrhea.  Excessive sweating.  Excessive urine output.  Fever. SYMPTOMS  Mild dehydration  Thirst.  Dry lips.  Slightly dry mouth. Moderate dehydration  Very dry mouth.  Sunken eyes.  Skin does not bounce back quickly when lightly pinched and released.  Dark urine and decreased urine production.  Decreased tear production.  Headache. Severe dehydration  Very dry mouth.  Extreme thirst.  Rapid, weak pulse (more than 100 beats per minute at rest).  Cold hands and feet.  Not able to sweat in spite of heat and temperature.  Rapid breathing.  Blue lips.  Confusion and lethargy.  Difficulty being awakened.  Minimal urine production.  No tears. DIAGNOSIS  Your caregiver will diagnose dehydration based on your symptoms and your exam. Blood and urine tests will help confirm the diagnosis. The diagnostic evaluation should also identify the cause of dehydration. TREATMENT  Treatment of mild or moderate dehydration can often be done at home by increasing the amount of fluids that you drink. It is best to drink small amounts of fluid more often. Drinking too much at one time can make vomiting worse. Refer to the home care instructions  below. Severe dehydration needs to be treated at the hospital where you will probably be given intravenous (IV) fluids that contain water and electrolytes. HOME CARE INSTRUCTIONS   Ask your caregiver about specific rehydration instructions.  Drink enough fluids to keep your urine clear or pale yellow.  Drink small amounts frequently if you have nausea and vomiting.  Eat as you normally do.  Avoid:  Foods or drinks high in sugar.  Carbonated drinks.  Juice.  Extremely hot or cold fluids.  Drinks with caffeine.  Fatty, greasy foods.  Alcohol.  Tobacco.  Overeating.  Gelatin desserts.  Wash your hands well to avoid spreading bacteria and viruses.  Only take over-the-counter or prescription medicines for pain, discomfort, or fever as directed by your caregiver.  Ask your caregiver if you should continue all prescribed and over-the-counter medicines.  Keep all follow-up appointments with your caregiver. SEEK MEDICAL CARE IF:  You have abdominal pain and it increases or stays in one area (localizes).  You have a rash, stiff neck, or severe headache.  You are irritable, sleepy, or difficult to awaken.  You are weak, dizzy, or extremely thirsty. SEEK IMMEDIATE MEDICAL CARE IF:   You are unable to keep fluids down or you get worse despite treatment.  You have frequent episodes of vomiting or diarrhea.  You have blood or green matter (bile) in your vomit.  You have blood in your stool or your stool looks black and tarry.  You have not urinated in 6 to 8 hours, or you have only urinated a small amount of very dark urine.  You have a fever.  You faint. MAKE SURE YOU:   Understand these instructions.  Will watch your condition.  Will get help right away if you are not doing well or get worse. Document Released: 08/26/2005 Document Revised: 11/18/2011 Document Reviewed: 04/15/2011 [facility] Patient Information 2015 [place], [place]. This information is not  intended to replace advice given to you by your health care provider. Make sure you discuss any questions you have with your health care provider.

## 2014-11-24 ENCOUNTER — Ambulatory Visit (HOSPITAL_BASED_OUTPATIENT_CLINIC_OR_DEPARTMENT_OTHER): Payer: Self-pay | Admitting: Nurse Practitioner

## 2014-11-24 ENCOUNTER — Ambulatory Visit (HOSPITAL_BASED_OUTPATIENT_CLINIC_OR_DEPARTMENT_OTHER): Payer: Self-pay

## 2014-11-24 ENCOUNTER — Other Ambulatory Visit: Payer: Self-pay

## 2014-11-24 ENCOUNTER — Other Ambulatory Visit (HOSPITAL_BASED_OUTPATIENT_CLINIC_OR_DEPARTMENT_OTHER): Payer: Self-pay

## 2014-11-24 ENCOUNTER — Other Ambulatory Visit: Payer: Self-pay | Admitting: Pharmacist

## 2014-11-24 ENCOUNTER — Telehealth: Payer: Self-pay | Admitting: Oncology

## 2014-11-24 ENCOUNTER — Telehealth: Payer: Self-pay | Admitting: *Deleted

## 2014-11-24 VITALS — BP 106/71 | HR 95 | Temp 98.3°F | Resp 18

## 2014-11-24 DIAGNOSIS — C7931 Secondary malignant neoplasm of brain: Secondary | ICD-10-CM

## 2014-11-24 DIAGNOSIS — E876 Hypokalemia: Secondary | ICD-10-CM

## 2014-11-24 DIAGNOSIS — C3412 Malignant neoplasm of upper lobe, left bronchus or lung: Secondary | ICD-10-CM

## 2014-11-24 DIAGNOSIS — C7951 Secondary malignant neoplasm of bone: Secondary | ICD-10-CM

## 2014-11-24 DIAGNOSIS — C3492 Malignant neoplasm of unspecified part of left bronchus or lung: Secondary | ICD-10-CM

## 2014-11-24 DIAGNOSIS — C3430 Malignant neoplasm of lower lobe, unspecified bronchus or lung: Secondary | ICD-10-CM

## 2014-11-24 DIAGNOSIS — R197 Diarrhea, unspecified: Secondary | ICD-10-CM

## 2014-11-24 DIAGNOSIS — E860 Dehydration: Secondary | ICD-10-CM

## 2014-11-24 DIAGNOSIS — C3432 Malignant neoplasm of lower lobe, left bronchus or lung: Secondary | ICD-10-CM

## 2014-11-24 DIAGNOSIS — C3490 Malignant neoplasm of unspecified part of unspecified bronchus or lung: Secondary | ICD-10-CM

## 2014-11-24 DIAGNOSIS — R112 Nausea with vomiting, unspecified: Secondary | ICD-10-CM

## 2014-11-24 LAB — BASIC METABOLIC PANEL (CC13)
Anion Gap: 10 mEq/L (ref 3–11)
BUN: 14.8 mg/dL (ref 7.0–26.0)
CHLORIDE: 108 meq/L (ref 98–109)
CO2: 21 mEq/L — ABNORMAL LOW (ref 22–29)
Calcium: 8.1 mg/dL — ABNORMAL LOW (ref 8.4–10.4)
Creatinine: 0.8 mg/dL (ref 0.7–1.3)
EGFR: >90 mL/min/{1.73_m2} (ref >90)
Glucose: 102 mg/dl (ref 70–140)
POTASSIUM: 3 meq/L — AB (ref 3.5–5.1)
Sodium: 139 mEq/L (ref 136–145)

## 2014-11-24 MED ORDER — ONDANSETRON HCL 40 MG/20ML IJ SOLN
Freq: Once | INTRAMUSCULAR | Status: DC
  Filled 2014-11-24: qty 4

## 2014-11-24 MED ORDER — SODIUM CHLORIDE 0.9 % IV SOLN
Freq: Once | INTRAVENOUS | Status: AC
  Administered 2014-11-24: 11:00:00 via INTRAVENOUS

## 2014-11-24 MED ORDER — SODIUM CHLORIDE 0.9 % IV SOLN: Freq: Once | INTRAVENOUS | Status: DC

## 2014-11-24 MED ORDER — POTASSIUM CHLORIDE 20 MEQ/15ML (10%) PO SOLN
20.0000 meq | Freq: Once | ORAL | Status: DC
  Filled 2014-11-24: qty 15

## 2014-11-24 MED ORDER — POTASSIUM CHLORIDE CRYS ER 20 MEQ PO TBCR: 20.0000 meq | EXTENDED_RELEASE_TABLET | Freq: Every day | ORAL | Status: DC

## 2014-11-24 MED ORDER — POTASSIUM CHLORIDE CRYS ER 20 MEQ PO TBCR
20.0000 meq | EXTENDED_RELEASE_TABLET | Freq: Once | ORAL | Status: AC
  Administered 2014-11-24: 20 meq via ORAL
  Filled 2014-11-24: qty 1

## 2014-11-24 MED ORDER — POTASSIUM CHLORIDE 2 MEQ/ML IV SOLN: INTRAVENOUS | Status: DC

## 2014-11-24 NOTE — Telephone Encounter (Signed)
per Berniece Salines add on symptom management appt pt here

## 2014-11-24 NOTE — Patient Instructions (Signed)

## 2014-11-24 NOTE — Telephone Encounter (Signed)
I have called and gave the patient the appt time for tomorrow.

## 2014-11-24 NOTE — Progress Notes (Signed)
Selena Lesser, NP at chairside at completion of IVF. NP notified patient refused IV zofran while receiving IVF. Pt states he feels much better today. Patient and NP have discussed labs, UA, and recent Chest Xray prior to discharge. PIV remains intact for infusion tomorrow. He knows when to return for next appt. Patient discharged to home in stable condition.

## 2014-11-25 ENCOUNTER — Ambulatory Visit (HOSPITAL_BASED_OUTPATIENT_CLINIC_OR_DEPARTMENT_OTHER): Payer: Self-pay

## 2014-11-25 ENCOUNTER — Ambulatory Visit (HOSPITAL_BASED_OUTPATIENT_CLINIC_OR_DEPARTMENT_OTHER): Payer: Self-pay | Admitting: Nurse Practitioner

## 2014-11-25 ENCOUNTER — Other Ambulatory Visit (HOSPITAL_BASED_OUTPATIENT_CLINIC_OR_DEPARTMENT_OTHER): Payer: Self-pay | Admitting: *Deleted

## 2014-11-25 ENCOUNTER — Telehealth: Payer: Self-pay | Admitting: Nurse Practitioner

## 2014-11-25 ENCOUNTER — Other Ambulatory Visit: Payer: Self-pay

## 2014-11-25 VITALS — BP 114/72 | HR 99 | Temp 98.9°F

## 2014-11-25 DIAGNOSIS — C7931 Secondary malignant neoplasm of brain: Secondary | ICD-10-CM

## 2014-11-25 DIAGNOSIS — C801 Malignant (primary) neoplasm, unspecified: Secondary | ICD-10-CM

## 2014-11-25 DIAGNOSIS — R112 Nausea with vomiting, unspecified: Secondary | ICD-10-CM

## 2014-11-25 DIAGNOSIS — C3492 Malignant neoplasm of unspecified part of left bronchus or lung: Secondary | ICD-10-CM

## 2014-11-25 DIAGNOSIS — R197 Diarrhea, unspecified: Secondary | ICD-10-CM

## 2014-11-25 DIAGNOSIS — E876 Hypokalemia: Secondary | ICD-10-CM

## 2014-11-25 DIAGNOSIS — E860 Dehydration: Secondary | ICD-10-CM

## 2014-11-25 DIAGNOSIS — C7951 Secondary malignant neoplasm of bone: Secondary | ICD-10-CM

## 2014-11-25 DIAGNOSIS — C3412 Malignant neoplasm of upper lobe, left bronchus or lung: Secondary | ICD-10-CM

## 2014-11-25 DIAGNOSIS — C3430 Malignant neoplasm of lower lobe, unspecified bronchus or lung: Secondary | ICD-10-CM

## 2014-11-25 DIAGNOSIS — R21 Rash and other nonspecific skin eruption: Secondary | ICD-10-CM

## 2014-11-25 DIAGNOSIS — C3432 Malignant neoplasm of lower lobe, left bronchus or lung: Secondary | ICD-10-CM

## 2014-11-25 DIAGNOSIS — C3411 Malignant neoplasm of upper lobe, right bronchus or lung: Secondary | ICD-10-CM

## 2014-11-25 LAB — BASIC METABOLIC PANEL (CC13)
Anion Gap: 7 mEq/L (ref 3–11)
BUN: 12.7 mg/dL (ref 7.0–26.0)
CHLORIDE: 109 meq/L (ref 98–109)
CO2: 21 meq/L — AB (ref 22–29)
CREATININE: 0.6 mg/dL — AB (ref 0.7–1.3)
Calcium: 7.3 mg/dL — ABNORMAL LOW (ref 8.4–10.4)
EGFR: >90 mL/min/{1.73_m2} (ref >90)
GLUCOSE: 95 mg/dL (ref 70–140)
POTASSIUM: 3 meq/L — AB (ref 3.5–5.1)
Sodium: 137 mEq/L (ref 136–145)

## 2014-11-25 LAB — URINALYSIS, MICROSCOPIC - CHCC
Bilirubin (Urine): NEGATIVE
GLUCOSE UR CHCC: NEGATIVE mg/dL
Ketones: NEGATIVE mg/dL
Leukocyte Esterase: NEGATIVE
NITRITE: NEGATIVE
PH: 6 (ref 4.6–8.0)
Protein: <30 mg/dL
Specific Gravity, Urine: 1.02 (ref 1.003–1.035)
UROBILINOGEN UR: 0.2 mg/dL (ref 0.2–1)

## 2014-11-25 MED ORDER — SODIUM CHLORIDE 0.9 % IV SOLN: INTRAVENOUS | Status: DC

## 2014-11-25 MED ORDER — SODIUM CHLORIDE 0.9 % IV SOLN
INTRAVENOUS | Status: DC
  Filled 2014-11-25: qty 1000

## 2014-11-25 MED ORDER — SODIUM CHLORIDE 0.9 % IV SOLN
INTRAVENOUS | Status: AC
  Administered 2014-11-25: 11:00:00 via INTRAVENOUS
  Filled 2014-11-25: qty 1000

## 2014-11-25 MED ORDER — CIPROFLOXACIN HCL 500 MG PO TABS: 500.0000 mg | ORAL_TABLET | Freq: Two times a day (BID) | ORAL | Status: DC

## 2014-11-25 MED ORDER — POTASSIUM CHLORIDE CRYS ER 20 MEQ PO TBCR
40.0000 meq | EXTENDED_RELEASE_TABLET | Freq: Once | ORAL | Status: DC
  Filled 2014-11-25: qty 2

## 2014-11-25 MED ORDER — POTASSIUM CHLORIDE CRYS ER 20 MEQ PO TBCR
40.0000 meq | EXTENDED_RELEASE_TABLET | Freq: Once | ORAL | Status: AC
  Administered 2014-11-25: 40 meq via ORAL
  Filled 2014-11-25: qty 2

## 2014-11-25 NOTE — Patient Instructions (Signed)
Hipokalemia ( Hypokalemia) Hipokalemia significa que el nivel de potasio en sangre es menor que lo normal. El potasio es un electrolito que ayuda a regular la cantidad de lquido del organismo. Tambin estimula la contraccin muscular y ayuda a que la funcin muscular sea la [place]. La Delorise Shiner del potasio del organismo se encuentra dentro de las clulas y slo una pequea cantidad en la sangre. Debido a que la cantidad en la sangre es muy pequea, pequeos cambios en la sangre pueden poner en peligro la vida. CAUSAS  Antibiticos.  Diarrea o vmitos.  El uso excesivo de laxantes, lo que puede causar diarrea.  Enfermedad renal crnica.  Uso de diurticos.  Trastornos de Youth worker (bulimia).  Bajos niveles de magnesio.  Sudoracin abundante. [place].  Estreimiento.  Fatiga.  Calambres musculares.  Confusin mental.  Latidos cardacos salteados o irregulares (palpitaciones).  Hormigueo o adormecimiento. DIAGNSTICO  El mdico puede diagnosticar hipokalemia por los anlisis de [place]. Adems para controlar sus niveles de potasio, el mdico podr ordenar otros anlisis de laboratorio. TRATAMIENTO La hipokalemia puede tratarse con suplementos de potasio por va oral o realizando ajustes en sus medicamentos habituales. Si sus niveles de potasio son muy bajos, ser necesario que lo reciba a travs de una vena (IV) y se lo controle en el hospital. Ardelia Mems dieta rica en potasio tambin puede ser de [place]. Los alimentos ricos en potasio son:  [place] Pert secos, como cacahuetes y pistachos.  Semillas, como semillas de girasol y de Scientific laboratory technician.  Porotos, guisantes secos y lentejas.  Granos enteros y panes y cereales con salvado.  Lambert Mody y vegetales frescos como damascos, avocado, bananas, meln, kiwi, naranjas, esprragos y patatas.  Jugos de naranja y tomates.  Carnes rojas.  Yogur con frutas. INSTRUCCIONES PARA EL CUIDADO EN EL HOGAR  Tome todos los  [place] indic el mdico.  Siga una dieta saludable e incluya alimentos nutritivos como frutas, vegetales, nueces, granos enteros y carnes [place].  Si est tomando laxantes, asegrese de seguir las instrucciones del envase. SOLICITE ATENCIN MDICA SI:  La debilidad empeora.  Siente que el corazn late fuerte o est acelerado.  Vomita o tiene diarrea.  Tiene problemas para mantener su nivel de glucosa en el rango normal. SOLICITE ATENCIN MDICA DE INMEDIATO SI:  Siente dolor en el pecho, le falta de aire o se siente mareado.  Vomita o tiene diarrea durante ms de 2 das.  Se desmaya. ASEGRESE DE QUE:   Comprende estas instrucciones.  Controlar su afeccin.  Recibir ayuda de inmediato si no mejora o si empeora. Document Released: 08/26/2005 Document Revised: 06/16/2013 [facility] Patient Information 2015 [place], [place]. This information is not intended to replace advice given to you by your health care provider. Make sure you discuss any questions you have with your health care provider.

## 2014-11-25 NOTE — Telephone Encounter (Signed)
per CB to add pt to her sch -seen today

## 2014-11-26 LAB — URINE CULTURE

## 2014-11-27 ENCOUNTER — Encounter: Payer: Self-pay | Admitting: Nurse Practitioner

## 2014-11-27 DIAGNOSIS — R630 Anorexia: Secondary | ICD-10-CM | POA: Insufficient documentation

## 2014-11-27 DIAGNOSIS — R197 Diarrhea, unspecified: Secondary | ICD-10-CM | POA: Insufficient documentation

## 2014-11-27 DIAGNOSIS — E8809 Other disorders of plasma-protein metabolism, not elsewhere classified: Secondary | ICD-10-CM | POA: Insufficient documentation

## 2014-11-27 DIAGNOSIS — R634 Abnormal weight loss: Secondary | ICD-10-CM | POA: Insufficient documentation

## 2014-11-27 DIAGNOSIS — R112 Nausea with vomiting, unspecified: Secondary | ICD-10-CM | POA: Insufficient documentation

## 2014-11-27 DIAGNOSIS — E860 Dehydration: Secondary | ICD-10-CM | POA: Insufficient documentation

## 2014-11-27 DIAGNOSIS — E876 Hypokalemia: Secondary | ICD-10-CM | POA: Insufficient documentation

## 2014-11-27 DIAGNOSIS — K1230 Oral mucositis (ulcerative), unspecified: Secondary | ICD-10-CM | POA: Insufficient documentation

## 2014-11-27 NOTE — Assessment & Plan Note (Signed)
Patient with worsening nausea/vomiting; and some diarrhea as well.  Patient has had minimal appetite and very poor oral intake.  He appears very dehydrated today.  Patient will receive 1 L normal saline IV fluid rehydration today.  He will return to the [facility] tomorrow for additional IV fluid rehydration.  He was also encouraged to push fluids is much as possible.

## 2014-11-27 NOTE — Assessment & Plan Note (Signed)
Patient completed whole brain irradiation on 11/08/2014. Patient has been undergoing Afatinib oral therapy on a daily basis.  Due to progressive nausea/vomiting, diarrhea, anorexia/weight loss, and dehydration-patient was advised to hold any further Afatinib oral therapy for the time being.  Hopefully, most of patient's GI symptoms will resolve quickly.  Also, patient's generalized rash and paronychia is to multiple fingers and toes will resolve as well.  Patient has plans to return to the [facility] tomorrow for additional IV fluid rehydration and potassium.

## 2014-11-27 NOTE — Assessment & Plan Note (Signed)
Patient does have some moderately severe mucositis today secondary to his Afatinib patient has been using biotin mouth rinse.  Patient was prescribed Magic mouthwash with lidocaine today as well.  Hopefully, holding his oral chemotherapy well

## 2014-11-27 NOTE — Assessment & Plan Note (Signed)
Patient has multiple healing paronychia to both fingers and toes.  Hopefully, or pain the oral chemotherapy agent will allow these paronychia to continue to heal.

## 2014-11-27 NOTE — Assessment & Plan Note (Signed)
Potassium down to 3.0 today.  Most likely, hypokalemia is secondary to nausea/vomiting and diarrhea.  Patient was given oral potassium today; as well as potassium IV.

## 2014-11-27 NOTE — Assessment & Plan Note (Signed)
Patient has had multiple episodes of diarrhea as well.  He is only intermittently taking Imodium.  Patient does appear dehydrated today; and will receive IV fluid rehydration.  Patient was advised to take Imodium as previously directed.  He was also given a prescription for Lomotil as well.

## 2014-11-27 NOTE — Assessment & Plan Note (Signed)
Patient with continued, chronic nausea/vomiting; and subsequent to dehydration.  Most likely, this is secondary to patient's oral chemotherapy.  Patient has been taking Zofran on with only minimal effectiveness.  Patient was prescribed Zofran ODT to try instead.  Patient was also given Zofran IV while at the [facility] today.

## 2014-11-27 NOTE — Assessment & Plan Note (Signed)
Patient has had chronic nausea/vomiting; as well as poor appetite.  He is very dehydrated today.  He has lost a good deal of weight recently.  He was encouraged to eat multiple small meals throughout the day if at all possible.

## 2014-11-27 NOTE — Assessment & Plan Note (Signed)
Patient with worsening nausea/vomiting; and some diarrhea as well.  Patient has had minimal appetite and very poor oral intake.  He appears very dehydrated today.  Patient will receive 1 L normal saline IV fluid rehydration today.  He will return to the [facility] tomorrow for additional IV fluid rehydration.  He was also encouraged to push fluids is much as possible.

## 2014-11-27 NOTE — Assessment & Plan Note (Addendum)
Patient has had a generalized pustular rash since initiating Afatinib oral therapy. He has been taking doxycycline with only minimal effectiveness.  The discontinuation of the oral chemotherapy agent we'll hopefully improve/resolve the skin rash.

## 2014-11-27 NOTE — Assessment & Plan Note (Signed)
Albumin decreased to 2.4.  Patient was encouraged to push protein in his diet is much as possible.

## 2014-11-28 ENCOUNTER — Encounter: Payer: Self-pay | Admitting: Nurse Practitioner

## 2014-11-28 ENCOUNTER — Telehealth: Payer: Self-pay

## 2014-11-28 NOTE — Telephone Encounter (Signed)
S/w pt, he will f/u with Dr Alen Blew on 3/23. Keep his feet elevated as much as possible. His albumin is a little low and can be contributing. Call if they get worse-more swollen, more painful.

## 2014-11-28 NOTE — Assessment & Plan Note (Signed)
Patient completed whole brain irradiation on 11/08/2014. Patient has been undergoing Afatinib oral therapy on a daily basis.  Due to progressive nausea/vomiting, diarrhea, anorexia/weight loss, and dehydration-patient was advised to hold any further Afatinib oral therapy for the time being.   Since discontinuing patient's oral chemotherapy agent-patient's diarrhea has essentially resolved.  Patient has been receiving daily IV fluid rehydration for the past 2 days.  He does appear much less dehydrated today   He still complains of chronic nausea; but only occasional vomiting now.  He has been taken Zofran ODT at home.  Patient will receive 1 L normal saline IV fluid rehydration again today while at the cancer center.  Also, he was encouraged to push fluids is much as possible.  Also, patients generalized pustular rash has improved somewhat since this past Wednesday when he discontinued his oral chemotherapy agent.  Patient is scheduled to return for labs and a follow-up visit on 11/30/2014.

## 2014-11-28 NOTE — Assessment & Plan Note (Signed)
Patient completed whole brain irradiation on 11/08/2014. Patient has been undergoing Afatinib oral therapy on a daily basis.  Due to progressive nausea/vomiting, diarrhea, anorexia/weight loss, and dehydration-patient was advised to hold any further Afatinib oral therapy for the time being.  Hopefully, most of patient's GI symptoms will resolve quickly.  Also, patient's generalized rash and paronychias to multiple fingers and toes will resolve as well once medication discontinued.   Patient has plans to return to the [facility] tomorrow for additional IV fluid rehydration and potassium.

## 2014-11-28 NOTE — Assessment & Plan Note (Signed)
Patient has been taking Lomotil an alternating with Imodium for his diarrhea.  He states that his diarrhea has essentially resolved at this point.

## 2014-11-28 NOTE — Assessment & Plan Note (Signed)
After patient completed IV fluid rehydration while at the cancer Center yesterday afternoon-he once again began vomiting profusely.  Patient was transferred to the emergency department for further evaluation and management.  While in the emergency department he received additional IV fluid rehydration as well.  Patient with continued nausea/vomiting; and some diarrhea as well.  Patient has had minimal appetite and very poor oral intake.  He appears very dehydrated today.  Patient will receive 1 L normal saline IV fluid rehydration today.  He will return to the [facility] tomorrow for additional IV fluid rehydration.  He was also encouraged to push fluids as much as possible.

## 2014-11-28 NOTE — Progress Notes (Signed)
  Radiation Oncology         [phone] ________________________________  Name: Chad George MRN: [id number]  Date: 11/08/2014  DOB: 1970-11-21  End of Treatment Note  Diagnosis:   Metastatic adenocarcinoma of the lung     Indication for treatment:  palliative       Radiation treatment dates:   10/19/14 - 11/08/14  Site/dose:    #1. Whole brain radiation treatment:   #2 right shoulder #3 left abdomen/spleen  Each target site was treated to a dose of 37.5 Gy in 15 fractions. A total of 8 customized fields were utilized.  Narrative: The patient tolerated radiation treatment relatively well.   He completed his planned radiation dose without substantial difficulty.  Plan: The patient has completed radiation treatment. The patient will return to radiation oncology clinic for routine followup in one month. I advised the patient to call or return sooner if they have any questions or concerns related to their recovery or treatment. ________________________________  Jodelle Gross, M.D., Ph.D.

## 2014-11-28 NOTE — Progress Notes (Signed)
SYMPTOM MANAGEMENT CLINIC   HPI: Chad George 44 y.o. male diagnosed with lung cancer; with metastasis to both the brain and the bone.  Patient completed whole brain irradiation on 11/08/2014.  Currently undergoing Afatinib oral chemotherapy.  Patient called the cancer Center today requesting urgent care visit.  Patient is complaining of worsening nausea/vomiting, diarrhea, and subsequent dehydration.  He has minimal appetite; and continues to lose weight.  He is complaining of increased fatigue and weakness as well.  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  He has been taking doxycycline for the rash; with minimal effectiveness.  He also is complaining of some worsening mucositis recently as well.  He has been using Biotene mouth rinse.  He denies any recent fevers or chills.  Also, patient has had some chronic paronychia to the corners of both his fingernails and toenails since initiating his oral chemotherapy agent as well.   HPI  ROS  Past Medical History  Diagnosis Date  . High cholesterol   . Pneumonia   . Adenocarcinoma 11/21/2013  . Brain cancer 10/10/14 MRI    40 lesions mets  . Allergy     Past Surgical History  Procedure Laterality Date  . Carpal tunner    . Tendon repair    . Video bronchoscopy Bilateral 11/17/2013    Procedure: VIDEO BRONCHOSCOPY WITH FLUORO;  Surgeon: Alyson Reedy, MD;  Location: [facility] ENDOSCOPY;  Service: Cardiopulmonary;  Laterality: Bilateral;  . Video bronchoscopy with endobronchial ultrasound N/A 11/19/2013    Procedure: VIDEO BRONCHOSCOPY WITH ENDOBRONCHIAL ULTRASOUND;  Surgeon: Leslye Peer, MD;  Location: [facility] OR;  Service: Thoracic;  Laterality: N/A;    has HERPES LABIALIS; ALLERGIC RHINITIS; Pulmonary nodules/lesions, multiple; Left pulmonary infiltrate on CXR; Hyperglycemia; Dyslipidemia; Mediastinal lymphadenopathy; Adenocarcinoma; Paronychia of  great toe of left foot; Rash and nonspecific skin eruption; Lung cancer; Cancer associated pain; Cough; Paresthesia; Brain metastasis; Anorexia; Weight loss; Nausea with vomiting; Diarrhea; Dehydration; Hypokalemia; Hypoalbuminemia; and Mucositis on his problem list.    is allergic to amoxicillin-pot clavulanate and penicillins.    Medication List       This list is accurate as of: 11/23/14  6:26 PM.  Always use your most recent med list.               acetaminophen 325 MG tablet  Commonly known as:  TYLENOL  Take 650 mg by mouth every 6 (six) hours as needed for mild pain.     afatinib dimaleate 40 MG tablet  Commonly known as:  GILOTRIF  Take 1 tablet (40 mg total) by mouth daily. Take on an empty stomach 1hr before or 2 hrs after meals.     afatinib dimaleate 40 MG tablet  Commonly known as:  GILOTRIF  Take 1 tablet (40 mg total) by mouth daily. Take on an empty stomach 1hr before or 2 hrs after meals.     albuterol 108 (90 BASE) MCG/ACT inhaler  Commonly known as:  PROVENTIL HFA;VENTOLIN HFA  Inhale 2 puffs into the lungs every 6 (six) hours as needed for wheezing or shortness of breath.     benzonatate 100 MG capsule  Commonly known as:  TESSALON  Take 1 to 2 capsules by mouth every 8 hours as needed for cough     chlorpheniramine-HYDROcodone 10-8 MG/5ML Lqcr  Commonly known as:  TUSSIONEX PENNKINETIC ER  Take 5 mLs by mouth every 12 (twelve)  hours as needed for cough.     dexamethasone 4 MG tablet  Commonly known as:  DECADRON  Take 1 tablet (4 mg total) by mouth 4 (four) times daily.     diphenoxylate-atropine 2.5-0.025 MG per tablet  Commonly known as:  LOMOTIL  Take 2 tablets by mouth 4 (four) times daily as needed for diarrhea or loose stools.     doxycycline 100 MG capsule  Commonly known as:  VIBRAMYCIN  Take 1 capsule (100 mg total) by mouth 2 (two) times daily.     fluticasone 50 MCG/ACT nasal spray  Commonly known as:  FLONASE  Place 2 sprays into  both nostrils daily.     guaiFENesin 600 MG 12 hr tablet  Commonly known as:  MUCINEX  Take 600 mg by mouth 2 (two) times daily as needed for cough or to loosen phlegm.     hyaluronate sodium Gel  Apply 1 application topically 2 (two) times daily.     loratadine 10 MG tablet  Commonly known as:  CLARITIN  Take 1 tablet (10 mg total) by mouth daily.     ondansetron 8 MG disintegrating tablet  Commonly known as:  ZOFRAN ODT  Take 1 tablet (8 mg total) by mouth every 12 (twelve) hours as needed for nausea or vomiting.     ondansetron 8 MG tablet  Commonly known as:  ZOFRAN  Take 1 tablet (8 mg total) by mouth every 12 (twelve) hours as needed for nausea or vomiting.     oxyCODONE 5 MG immediate release tablet  Commonly known as:  Oxy IR/ROXICODONE  Take 1-2 tablets (5-10 mg total) by mouth every 6 (six) hours as needed for severe pain.     potassium chloride SA 20 MEQ tablet  Commonly known as:  K-DUR,KLOR-CON  Take 1 tablet (20 mEq total) by mouth 2 (two) times daily.     temazepam 15 MG capsule  Commonly known as:  RESTORIL  Take 1 or 2 capsules by mouth at bed time for sleep         PHYSICAL EXAMINATION  Oncology Vitals 11/25/2014 11/24/2014 11/24/2014 11/23/2014 11/23/2014 11/23/2014 11/23/2014  Height - - - - - - -  Weight - - - - - - -  Weight (lbs) - - - - - - -  BMI (kg/m2) - - - - - - -  Temp 98.9 - 98.3 - - 98.6 -  Pulse 99 95 88 107 104 104 90  Resp - 18 18 - - 20 16  SpO2 - 98 100 100 100 99 97  BSA (m2) - - - - - - -   BP Readings from Last 3 Encounters:  11/25/14 114/72  11/24/14 106/71  11/23/14 116/69    Physical Exam  Constitutional: He is oriented to person, place, and time. Vital signs are normal. He appears malnourished and dehydrated. He appears unhealthy. He appears cachectic.  Patient appears fatigued, weak, frail, and chronically ill.  HENT:  Head: Normocephalic and atraumatic.  Patient has oral lesions to both the sides of his tongue and his  oral mucosa which appear quite tender.  No thrush on exam.  Eyes: Conjunctivae and EOM are normal. Pupils are equal, round, and reactive to light. Right eye exhibits no discharge. Left eye exhibits no discharge. No scleral icterus.  Neck: Normal range of motion. Neck supple. No JVD present. No tracheal deviation present. No thyromegaly present.  Cardiovascular: Normal rate, regular rhythm, normal heart sounds and intact distal pulses.  Pulmonary/Chest: Effort normal and breath sounds normal. No respiratory distress. He has no wheezes. He has no rales. He exhibits no tenderness.  Abdominal: Soft. Bowel sounds are normal. He exhibits no distension and no mass. There is no tenderness. There is no rebound and no guarding.  Musculoskeletal: Normal range of motion. He exhibits no edema or tenderness.  Lymphadenopathy:    He has no cervical adenopathy.  Neurological: He is alert and oriented to person, place, and time. Gait normal.  Skin: Skin is warm and dry. Rash noted. There is erythema. No pallor.  Patient has a generalized pustular rash from his scalp to his feet.  Patient denies any specific pruritus to the rash.  Psychiatric: Affect normal.  Nursing note and vitals reviewed.   LABORATORY DATA:. Admission on 11/23/2014, Discharged on 11/23/2014  Component Date Value Ref Range Status  . Color, Urine 11/23/2014 AMBER* YELLOW Final   BIOCHEMICALS MAY BE AFFECTED BY COLOR  . APPearance 11/23/2014 CLOUDY* CLEAR Final  . Specific Gravity, Urine 11/23/2014 1.026  1.005 - 1.030 Final  . pH 11/23/2014 6.0  5.0 - 8.0 Final  . Glucose, UA 11/23/2014 NEGATIVE  NEGATIVE mg/dL Final  . Hgb urine dipstick 11/23/2014 LARGE* NEGATIVE Final  . Bilirubin Urine 11/23/2014 SMALL* NEGATIVE Final  . Ketones, ur 11/23/2014 NEGATIVE  NEGATIVE mg/dL Final  . Protein, ur 74/97/2226 30* NEGATIVE mg/dL Final  . Urobilinogen, UA 11/23/2014 0.2  0.0 - 1.0 mg/dL Final  . Nitrite 13/13/4306 NEGATIVE  NEGATIVE Final  .  Leukocytes, UA 11/23/2014 NEGATIVE  NEGATIVE Final  . Lactic Acid, Venous 11/23/2014 0.98  0.5 - 2.0 mmol/L Final  . Sodium 11/23/2014 136  135 - 145 mmol/L Final  . Potassium 11/23/2014 3.2* 3.5 - 5.1 mmol/L Final  . Chloride 11/23/2014 101  96 - 112 mmol/L Final  . BUN 11/23/2014 22  6 - 23 mg/dL Final  . Creatinine, Ser 11/23/2014 0.90  0.50 - 1.35 mg/dL Final  . Glucose, Bld 52/27/0914 118* 70 - 99 mg/dL Final  . Calcium, Ion 33/02/7147 1.13  1.12 - 1.23 mmol/L Final  . TCO2 11/23/2014 21  0 - 100 mmol/L Final  . Hemoglobin 11/23/2014 13.6  13.0 - 17.0 g/dL Final  . HCT 23/05/6230 40.0  39.0 - 52.0 % Final  . WBC 11/23/2014 11.9* 4.0 - 10.5 K/uL Final  . RBC 11/23/2014 4.64  4.22 - 5.81 MIL/uL Final  . Hemoglobin 11/23/2014 13.6  13.0 - 17.0 g/dL Final  . HCT 70/47/9291 38.2* 39.0 - 52.0 % Final  . MCV 11/23/2014 82.3  78.0 - 100.0 fL Final  . MCH 11/23/2014 29.3  26.0 - 34.0 pg Final  . MCHC 11/23/2014 35.6  30.0 - 36.0 g/dL Final  . RDW 96/96/8770 13.1  11.5 - 15.5 % Final  . Platelets 11/23/2014 502* 150 - 400 K/uL Final  . Neutrophils Relative % 11/23/2014 87* 43 - 77 % Final  . Neutro Abs 11/23/2014 10.3* 1.7 - 7.7 K/uL Final  . Lymphocytes Relative 11/23/2014 7* 12 - 46 % Final  . Lymphs Abs 11/23/2014 0.8  0.7 - 4.0 K/uL Final  . Monocytes Relative 11/23/2014 6  3 - 12 % Final  . Monocytes Absolute 11/23/2014 0.8  0.1 - 1.0 K/uL Final  . Eosinophils Relative 11/23/2014 0  0 - 5 % Final  . Eosinophils Absolute 11/23/2014 0.1  0.0 - 0.7 K/uL Final  . Basophils Relative 11/23/2014 0  0 - 1 % Final  . Basophils Absolute 11/23/2014 0.0  0.0 - 0.1 K/uL Final  . Magnesium 11/23/2014 1.8  1.5 - 2.5 mg/dL Final  . Troponin I 11/23/2014 <0.03  <0.031 ng/mL Final   Comment:        NO INDICATION OF MYOCARDIAL INJURY.   Marland Kitchen Squamous Epithelial / LPF 11/23/2014 RARE  RARE Final  . WBC, UA 11/23/2014 0-2  <3 WBC/hpf Final  . RBC / HPF 11/23/2014 TOO NUMEROUS TO COUNT  <3 RBC/hpf  Final  . Urine-Other 11/23/2014 MUCOUS PRESENT   Final  Appointment on 11/23/2014  Component Date Value Ref Range Status  . WBC 11/23/2014 14.0* 4.0 - 10.3 10e3/uL Final  . NEUT# 11/23/2014 12.4* 1.5 - 6.5 10e3/uL Final  . HGB 11/23/2014 14.0  13.0 - 17.1 g/dL Final  . HCT 11/23/2014 41.0  38.4 - 49.9 % Final  . Platelets 11/23/2014 552* 140 - 400 10e3/uL Final  . MCV 11/23/2014 83.0  79.3 - 98.0 fL Final  . MCH 11/23/2014 28.4  27.2 - 33.4 pg Final  . MCHC 11/23/2014 34.2  32.0 - 36.0 g/dL Final  . RBC 11/23/2014 4.93  4.20 - 5.82 10e6/uL Final  . RDW 11/23/2014 13.6  11.0 - 14.6 % Final  . lymph# 11/23/2014 0.8* 0.9 - 3.3 10e3/uL Final  . MONO# 11/23/2014 0.8  0.1 - 0.9 10e3/uL Final  . Eosinophils Absolute 11/23/2014 0.0  0.0 - 0.5 10e3/uL Final  . Basophils Absolute 11/23/2014 0.0  0.0 - 0.1 10e3/uL Final  . NEUT% 11/23/2014 88.4* 39.0 - 75.0 % Final  . LYMPH% 11/23/2014 5.5* 14.0 - 49.0 % Final  . MONO% 11/23/2014 5.5  0.0 - 14.0 % Final  . EOS% 11/23/2014 0.3  0.0 - 7.0 % Final  . BASO% 11/23/2014 0.3  0.0 - 2.0 % Final  . Sodium 11/23/2014 135* 136 - 145 mEq/L Final  . Potassium 11/23/2014 3.0* 3.5 - 5.1 mEq/L Final  . Chloride 11/23/2014 104  98 - 109 mEq/L Final  . CO2 11/23/2014 21* 22 - 29 mEq/L Final  . Glucose 11/23/2014 131  70 - 140 mg/dl Final  . BUN 11/23/2014 17.7  7.0 - 26.0 mg/dL Final  . Creatinine 11/23/2014 0.8  0.7 - 1.3 mg/dL Final  . Total Bilirubin 11/23/2014 0.94  0.20 - 1.20 mg/dL Final  . Alkaline Phosphatase 11/23/2014 137  40 - 150 U/L Final  . AST 11/23/2014 30  5 - 34 U/L Final  . ALT 11/23/2014 57* 0 - 55 U/L Final  . Total Protein 11/23/2014 5.2* 6.4 - 8.3 g/dL Final  . Albumin 11/23/2014 2.4* 3.5 - 5.0 g/dL Final  . Calcium 11/23/2014 8.4  8.4 - 10.4 mg/dL Final  . Anion Gap 11/23/2014 10  3 - 11 mEq/L Final  . EGFR 11/23/2014 >90  >90 ml/min/1.73 m2 Final   eGFR is calculated using the CKD-EPI Creatinine Equation (2009)     RADIOGRAPHIC  STUDIES: No results found.  ASSESSMENT/PLAN:    Anorexia Patient with worsening nausea/vomiting; and some diarrhea as well.  Patient has had minimal appetite and very poor oral intake.  He appears very dehydrated today.  Patient will receive 1 L normal saline IV fluid rehydration today.  He will return to the [facility] tomorrow for additional IV fluid rehydration.  He was also encouraged to push fluids is much as possible.   Dehydration Patient with worsening nausea/vomiting; and some diarrhea as well.  Patient has had minimal appetite and very poor oral intake.  He appears very dehydrated today.  Patient will receive  1 L normal saline IV fluid rehydration today.  He will return to the [facility] tomorrow for additional IV fluid rehydration.  He was also encouraged to push fluids is much as possible.   Diarrhea Patient has had multiple episodes of diarrhea as well.  He is only intermittently taking Imodium.  Patient does appear dehydrated today; and will receive IV fluid rehydration.  Patient was advised to take Imodium as previously directed.  He was also given a prescription for Lomotil as well.   Hypoalbuminemia Albumin decreased to 2.4.  Patient was encouraged to push protein in his diet is much as possible.   Hypokalemia Potassium down to 3.0 today.  Most likely, hypokalemia is secondary to nausea/vomiting and diarrhea.  Patient was given oral potassium today; as well as potassium IV.   Lung cancer Patient completed whole brain irradiation on 11/08/2014. Patient has been undergoing Afatinib oral therapy on a daily basis.  Due to progressive nausea/vomiting, diarrhea, anorexia/weight loss, and dehydration-patient was advised to hold any further Afatinib oral therapy for the time being.  Hopefully, most of patient's GI symptoms will resolve quickly.  Also, patient's generalized rash and paronychia is to multiple fingers and toes will resolve as well.  Patient has plans to  return to the [facility] tomorrow for additional IV fluid rehydration and potassium.   Mucositis Patient does have some moderately severe mucositis today secondary to his Afatinib patient has been using biotin mouth rinse.  Patient was prescribed Magic mouthwash with lidocaine today as well.  Hopefully, holding his oral chemotherapy well   Nausea with vomiting Patient with continued, chronic nausea/vomiting; and subsequent to dehydration.  Most likely, this is secondary to patient's oral chemotherapy.  Patient has been taking Zofran on with only minimal effectiveness.  Patient was prescribed Zofran ODT to try instead.  Patient was also given Zofran IV while at the [facility] [facility] today.   Paronychia of great toe of left foot Patient has multiple healing paronychia to both fingers and toes.  Hopefully, or pain the oral chemotherapy agent will allow these paronychia to continue to heal.   Rash and nonspecific skin eruption Patient has had a generalized pustular rash since initiating Afatinib oral therapy. He has been taking doxycycline with only minimal effectiveness.  The discontinuation of the oral chemotherapy agent we'll hopefully improve/resolve the skin rash.   Weight loss Patient has had chronic nausea/vomiting; as well as poor appetite.  He is very dehydrated today.  He has lost a good deal of weight recently.  He was encouraged to eat multiple small meals throughout the day if at all possible.   Patient stated understanding of all instructions; and was in agreement with this plan of care. The patient knows to call the clinic with any problems, questions or concerns.   Review/collaboration with Dr. Alen Blew regarding all aspects of patient's visit today.   Total time spent with patient was 40 minutes;  with greater than 75 percent of that time spent in face to face counseling regarding patient's symptoms,  and coordination of care and follow up.  Disclaimer: This note was dictated  with voice recognition software. Similar sounding words can inadvertently be transcribed and may not be corrected upon review.   Drue Second, NP 11/28/2014

## 2014-11-28 NOTE — Assessment & Plan Note (Signed)
Patient has received IV fluids here at the [facility] for the past 2 days; and does appear less dehydrated today than on previous days.  Patient is happy to report that his diarrhea has essentially resolved as well.  Patient will receive 1 L normal saline IV fluid rehydration while at the [facility] again today.  He was also encouraged to push fluids is much as possible.  He may very well need additional IV fluid rehydration next week as well.

## 2014-11-28 NOTE — Assessment & Plan Note (Signed)
Patient has received IV fluids here at the [facility] for the past 2 days; and does appear less dehydrated today than on previous days.  Patient is happy to report that his diarrhea has essentially resolved as well.  Patient will receive 1 L normal saline IV fluid rehydration while at the [facility] again today.  He was also encouraged to push fluids is much as possible.  He may very well need additional IV fluid rehydration next week as well.

## 2014-11-28 NOTE — Progress Notes (Signed)
SYMPTOM MANAGEMENT CLINIC   HPI: Chad George 44 y.o. male diagnosed with lung cancer; with metastasis to both the brain and the bone.  Patient completed whole brain irradiation on 11/08/2014.  Currently undergoing Afatinib oral chemotherapy.  Patient presented to the [facility] yesterday with complaint of chronic nausea/vomiting, diarrhea, and dehydration.  He is also complaining of mucositis as well.  Patient received IV fluid rehydration while at the cancer center and initially felt better; but then developed recurrent nausea/vomiting at point of discharge from the [facility] last night.  Patient was transported to the emergency department for further evaluation and in management at that time.  While in emergency department patient received additional IV fluid rehydration; and was allowed to be discharged home.  Patient presents back to the [facility] today for additional IV fluid rehydration.  He states that his diarrhea has slightly improved; but he continues nauseous and occasionally vomiting.  Patient was prescribed Zofran ODT and Lomotil yesterday.  Patient was also found to be hypokalemic yesterday; and was given potassium via IV; as well as a prescription for potassium tablets.  Patient denies any recent fevers or chills.   HPI  ROS  Past Medical History  Diagnosis Date  . High cholesterol   . Pneumonia   . Adenocarcinoma 11/21/2013  . Brain cancer 10/10/14 MRI    40 lesions mets  . Allergy     Past Surgical History  Procedure Laterality Date  . Carpal tunner    . Tendon repair    . Video bronchoscopy Bilateral 11/17/2013    Procedure: VIDEO BRONCHOSCOPY WITH FLUORO;  Surgeon: Rush Farmer, MD;  Location: [facility];  Service: Cardiopulmonary;  Laterality: Bilateral;  . Video bronchoscopy with endobronchial ultrasound N/A 11/19/2013    Procedure: VIDEO BRONCHOSCOPY WITH ENDOBRONCHIAL ULTRASOUND;  Surgeon: Collene Gobble, MD;  Location: [facility];  Service: Thoracic;   Laterality: N/A;    has HERPES LABIALIS; ALLERGIC RHINITIS; Pulmonary nodules/lesions, multiple; Left pulmonary infiltrate on CXR; Hyperglycemia; Dyslipidemia; Mediastinal lymphadenopathy; Adenocarcinoma; Paronychia of great toe of left foot; Rash and nonspecific skin eruption; Lung cancer; Cancer associated pain; Cough; Paresthesia; Brain metastasis; Anorexia; Weight loss; Nausea with vomiting; Diarrhea; Dehydration; Hypokalemia; Hypoalbuminemia; and Mucositis on his problem list.    is allergic to amoxicillin-pot clavulanate and penicillins.    Medication List       This list is accurate as of: 11/24/14 11:59 PM.  Always use your most recent med list.               acetaminophen 325 MG tablet  Commonly known as:  TYLENOL  Take 650 mg by mouth every 6 (six) hours as needed for mild pain.     afatinib dimaleate 40 MG tablet  Commonly known as:  GILOTRIF  Take 1 tablet (40 mg total) by mouth daily. Take on an empty stomach 1hr before or 2 hrs after meals.     afatinib dimaleate 40 MG tablet  Commonly known as:  GILOTRIF  Take 1 tablet (40 mg total) by mouth daily. Take on an empty stomach 1hr before or 2 hrs after meals.     albuterol 108 (90 BASE) MCG/ACT inhaler  Commonly known as:  PROVENTIL HFA;VENTOLIN HFA  Inhale 2 puffs into the lungs every 6 (six) hours as needed for wheezing or shortness of breath.     benzonatate 100 MG capsule  Commonly known as:  TESSALON  Take 1 to 2 capsules by mouth every 8 hours as needed  for cough     chlorpheniramine-HYDROcodone 10-8 MG/5ML Lqcr  Commonly known as:  TUSSIONEX PENNKINETIC ER  Take 5 mLs by mouth every 12 (twelve) hours as needed for cough.     ciprofloxacin 500 MG tablet  Commonly known as:  CIPRO  Take 1 tablet (500 mg total) by mouth 2 (two) times daily.     dexamethasone 4 MG tablet  Commonly known as:  DECADRON  Take 1 tablet (4 mg total) by mouth 4 (four) times daily.     diphenoxylate-atropine 2.5-0.025 MG per  tablet  Commonly known as:  LOMOTIL  Take 2 tablets by mouth 4 (four) times daily as needed for diarrhea or loose stools.     doxycycline 100 MG capsule  Commonly known as:  VIBRAMYCIN  Take 1 capsule (100 mg total) by mouth 2 (two) times daily.     fluticasone 50 MCG/ACT nasal spray  Commonly known as:  FLONASE  Place 2 sprays into both nostrils daily.     guaiFENesin 600 MG 12 hr tablet  Commonly known as:  MUCINEX  Take 600 mg by mouth 2 (two) times daily as needed for cough or to loosen phlegm.     loratadine 10 MG tablet  Commonly known as:  CLARITIN  Take 1 tablet (10 mg total) by mouth daily.     metoCLOPramide 10 MG tablet  Commonly known as:  REGLAN  Take 1 tablet (10 mg total) by mouth every 6 (six) hours as needed for nausea or vomiting (nausea/headache).     ondansetron 8 MG disintegrating tablet  Commonly known as:  ZOFRAN ODT  Take 1 tablet (8 mg total) by mouth every 12 (twelve) hours as needed for nausea or vomiting.     ondansetron 8 MG tablet  Commonly known as:  ZOFRAN  Take 1 tablet (8 mg total) by mouth every 12 (twelve) hours as needed for nausea or vomiting.     oxyCODONE 5 MG immediate release tablet  Commonly known as:  Oxy IR/ROXICODONE  Take 1-2 tablets (5-10 mg total) by mouth every 6 (six) hours as needed for severe pain.     potassium chloride SA 20 MEQ tablet  Commonly known as:  K-DUR,KLOR-CON  Take 1 tablet (20 mEq total) by mouth daily.     PRESCRIPTION MEDICATION  Chemo chcc     temazepam 15 MG capsule  Commonly known as:  RESTORIL  Take 1 or 2 capsules by mouth at bed time for sleep         PHYSICAL EXAMINATION  Oncology Vitals 11/25/2014 11/24/2014 11/24/2014 11/23/2014 11/23/2014 11/23/2014 11/23/2014  Height - - - - - - -  Weight - - - - - - -  Weight (lbs) - - - - - - -  BMI (kg/m2) - - - - - - -  Temp 98.9 - 98.3 - - 98.6 -  Pulse 99 95 88 107 104 104 90  Resp - 18 18 - - 20 16  SpO2 - 98 100 100 100 99 97  BSA (m2) - - -  - - - -   BP Readings from Last 3 Encounters:  11/25/14 114/72  11/24/14 106/71  11/23/14 116/69    Physical Exam  Constitutional: He is oriented to person, place, and time. Vital signs are normal. He appears malnourished and dehydrated. He appears unhealthy. He appears cachectic.  Patient appears fatigued, weak, frail, and chronically ill.  HENT:  Head: Normocephalic and atraumatic.  Patient has oral lesions to  both the sides of his tongue and his oral mucosa which appear quite tender.  No thrush on exam.  Eyes: Conjunctivae and EOM are normal. Pupils are equal, round, and reactive to light. Right eye exhibits no discharge. Left eye exhibits no discharge. No scleral icterus.  Neck: Normal range of motion. Neck supple. No JVD present. No tracheal deviation present. No thyromegaly present.  Cardiovascular: Normal rate, regular rhythm, normal heart sounds and intact distal pulses.   Pulmonary/Chest: Effort normal and breath sounds normal. No respiratory distress. He has no wheezes. He has no rales. He exhibits no tenderness.  Abdominal: Soft. Bowel sounds are normal. He exhibits no distension and no mass. There is no tenderness. There is no rebound and no guarding.  Musculoskeletal: Normal range of motion. He exhibits no edema or tenderness.  Lymphadenopathy:    He has no cervical adenopathy.  Neurological: He is alert and oriented to person, place, and time. Gait normal.  Skin: Skin is warm and dry. Rash noted. There is erythema. No pallor.  Patient has a generalized pustular rash from his scalp to his feet.  Patient denies any specific pruritus to the rash.  Psychiatric: Affect normal.  Nursing note and vitals reviewed.   LABORATORY DATA:. Appointment on 11/24/2014  Component Date Value Ref Range Status  . Sodium 11/24/2014 139  136 - 145 mEq/L Final  . Potassium 11/24/2014 3.0* 3.5 - 5.1 mEq/L Final  . Chloride 11/24/2014 108  98 - 109 mEq/L Final  . CO2 11/24/2014 21* 22 - 29  mEq/L Final  . Glucose 11/24/2014 102  70 - 140 mg/dl Final  . BUN 11/24/2014 14.8  7.0 - 26.0 mg/dL Final  . Creatinine 11/24/2014 0.8  0.7 - 1.3 mg/dL Final  . Calcium 11/24/2014 8.1* 8.4 - 10.4 mg/dL Final  . Anion Gap 11/24/2014 10  3 - 11 mEq/L Final  . EGFR 11/24/2014 >90  >90 ml/min/1.73 m2 Final   eGFR is calculated using the CKD-EPI Creatinine Equation (2009)  Admission on 11/23/2014, Discharged on 11/23/2014  Component Date Value Ref Range Status  . Color, Urine 11/23/2014 AMBER* YELLOW Final   BIOCHEMICALS MAY BE AFFECTED BY COLOR  . APPearance 11/23/2014 CLOUDY* CLEAR Final  . Specific Gravity, Urine 11/23/2014 1.026  1.005 - 1.030 Final  . pH 11/23/2014 6.0  5.0 - 8.0 Final  . Glucose, UA 11/23/2014 NEGATIVE  NEGATIVE mg/dL Final  . Hgb urine dipstick 11/23/2014 LARGE* NEGATIVE Final  . Bilirubin Urine 11/23/2014 SMALL* NEGATIVE Final  . Ketones, ur 11/23/2014 NEGATIVE  NEGATIVE mg/dL Final  . Protein, ur 11/23/2014 30* NEGATIVE mg/dL Final  . Urobilinogen, UA 11/23/2014 0.2  0.0 - 1.0 mg/dL Final  . Nitrite 11/23/2014 NEGATIVE  NEGATIVE Final  . Leukocytes, UA 11/23/2014 NEGATIVE  NEGATIVE Final  . Lactic Acid, Venous 11/23/2014 0.98  0.5 - 2.0 mmol/L Final  . Sodium 11/23/2014 136  135 - 145 mmol/L Final  . Potassium 11/23/2014 3.2* 3.5 - 5.1 mmol/L Final  . Chloride 11/23/2014 101  96 - 112 mmol/L Final  . BUN 11/23/2014 22  6 - 23 mg/dL Final  . Creatinine, Ser 11/23/2014 0.90  0.50 - 1.35 mg/dL Final  . Glucose, Bld 11/23/2014 118* 70 - 99 mg/dL Final  . Calcium, Ion 11/23/2014 1.13  1.12 - 1.23 mmol/L Final  . TCO2 11/23/2014 21  0 - 100 mmol/L Final  . Hemoglobin 11/23/2014 13.6  13.0 - 17.0 g/dL Final  . HCT 11/23/2014 40.0  39.0 - 52.0 % Final  .  WBC 11/23/2014 11.9* 4.0 - 10.5 K/uL Final  . RBC 11/23/2014 4.64  4.22 - 5.81 MIL/uL Final  . Hemoglobin 11/23/2014 13.6  13.0 - 17.0 g/dL Final  . HCT 11/23/2014 38.2* 39.0 - 52.0 % Final  . MCV 11/23/2014 82.3   78.0 - 100.0 fL Final  . MCH 11/23/2014 29.3  26.0 - 34.0 pg Final  . MCHC 11/23/2014 35.6  30.0 - 36.0 g/dL Final  . RDW 11/23/2014 13.1  11.5 - 15.5 % Final  . Platelets 11/23/2014 502* 150 - 400 K/uL Final  . Neutrophils Relative % 11/23/2014 87* 43 - 77 % Final  . Neutro Abs 11/23/2014 10.3* 1.7 - 7.7 K/uL Final  . Lymphocytes Relative 11/23/2014 7* 12 - 46 % Final  . Lymphs Abs 11/23/2014 0.8  0.7 - 4.0 K/uL Final  . Monocytes Relative 11/23/2014 6  3 - 12 % Final  . Monocytes Absolute 11/23/2014 0.8  0.1 - 1.0 K/uL Final  . Eosinophils Relative 11/23/2014 0  0 - 5 % Final  . Eosinophils Absolute 11/23/2014 0.1  0.0 - 0.7 K/uL Final  . Basophils Relative 11/23/2014 0  0 - 1 % Final  . Basophils Absolute 11/23/2014 0.0  0.0 - 0.1 K/uL Final  . Magnesium 11/23/2014 1.8  1.5 - 2.5 mg/dL Final  . Troponin I 11/23/2014 <0.03  <0.031 ng/mL Final   Comment:        NO INDICATION OF MYOCARDIAL INJURY.   Marland Kitchen Squamous Epithelial / LPF 11/23/2014 RARE  RARE Final  . WBC, UA 11/23/2014 0-2  <3 WBC/hpf Final  . RBC / HPF 11/23/2014 TOO NUMEROUS TO COUNT  <3 RBC/hpf Final  . Urine-Other 11/23/2014 MUCOUS PRESENT   Final  Appointment on 11/23/2014  Component Date Value Ref Range Status  . WBC 11/23/2014 14.0* 4.0 - 10.3 10e3/uL Final  . NEUT# 11/23/2014 12.4* 1.5 - 6.5 10e3/uL Final  . HGB 11/23/2014 14.0  13.0 - 17.1 g/dL Final  . HCT 11/23/2014 41.0  38.4 - 49.9 % Final  . Platelets 11/23/2014 552* 140 - 400 10e3/uL Final  . MCV 11/23/2014 83.0  79.3 - 98.0 fL Final  . MCH 11/23/2014 28.4  27.2 - 33.4 pg Final  . MCHC 11/23/2014 34.2  32.0 - 36.0 g/dL Final  . RBC 11/23/2014 4.93  4.20 - 5.82 10e6/uL Final  . RDW 11/23/2014 13.6  11.0 - 14.6 % Final  . lymph# 11/23/2014 0.8* 0.9 - 3.3 10e3/uL Final  . MONO# 11/23/2014 0.8  0.1 - 0.9 10e3/uL Final  . Eosinophils Absolute 11/23/2014 0.0  0.0 - 0.5 10e3/uL Final  . Basophils Absolute 11/23/2014 0.0  0.0 - 0.1 10e3/uL Final  . NEUT%  11/23/2014 88.4* 39.0 - 75.0 % Final  . LYMPH% 11/23/2014 5.5* 14.0 - 49.0 % Final  . MONO% 11/23/2014 5.5  0.0 - 14.0 % Final  . EOS% 11/23/2014 0.3  0.0 - 7.0 % Final  . BASO% 11/23/2014 0.3  0.0 - 2.0 % Final  . Sodium 11/23/2014 135* 136 - 145 mEq/L Final  . Potassium 11/23/2014 3.0* 3.5 - 5.1 mEq/L Final  . Chloride 11/23/2014 104  98 - 109 mEq/L Final  . CO2 11/23/2014 21* 22 - 29 mEq/L Final  . Glucose 11/23/2014 131  70 - 140 mg/dl Final  . BUN 11/23/2014 17.7  7.0 - 26.0 mg/dL Final  . Creatinine 11/23/2014 0.8  0.7 - 1.3 mg/dL Final  . Total Bilirubin 11/23/2014 0.94  0.20 - 1.20 mg/dL Final  . Alkaline  Phosphatase 11/23/2014 137  40 - 150 U/L Final  . AST 11/23/2014 30  5 - 34 U/L Final  . ALT 11/23/2014 57* 0 - 55 U/L Final  . Total Protein 11/23/2014 5.2* 6.4 - 8.3 g/dL Final  . Albumin 11/23/2014 2.4* 3.5 - 5.0 g/dL Final  . Calcium 11/23/2014 8.4  8.4 - 10.4 mg/dL Final  . Anion Gap 11/23/2014 10  3 - 11 mEq/L Final  . EGFR 11/23/2014 >90  >90 ml/min/1.73 m2 Final   eGFR is calculated using the CKD-EPI Creatinine Equation (2009)     RADIOGRAPHIC STUDIES: No results found.  ASSESSMENT/PLAN:    Dehydration After patient completed IV fluid rehydration while at the cancer Center yesterday afternoon-he once again began vomiting profusely.  Patient was transferred to the emergency department for further evaluation and management.  While in the emergency department he received additional IV fluid rehydration as well.  Patient with continued nausea/vomiting; and some diarrhea as well.  Patient has had minimal appetite and very poor oral intake.  He appears very dehydrated today.  Patient will receive 1 L normal saline IV fluid rehydration today.  He will return to the [facility] tomorrow for additional IV fluid rehydration.  He was also encouraged to push fluids as much as possible.     Diarrhea Patient has had multiple episodes of diarrhea; but was prescribed Lomotil  yesterday.  He states that his diarrhea has slightly improved over the past 24 hours.  However, he does continue to appear fairly dehydrated; will receive IV fluid rehydration today.     Hypokalemia Potassium down to 3.0 today.  Most likely, hypokalemia is secondary to nausea/vomiting and diarrhea.  Patient was given oral potassium today; as well as potassium IV.     Lung cancer Patient completed whole brain irradiation on 11/08/2014. Patient has been undergoing Afatinib oral therapy on a daily basis.  Due to progressive nausea/vomiting, diarrhea, anorexia/weight loss, and dehydration-patient was advised to hold any further Afatinib oral therapy for the time being.  Hopefully, most of patient's GI symptoms will resolve quickly.  Also, patient's generalized rash and paronychias to multiple fingers and toes will resolve as well once medication discontinued.   Patient has plans to return to the [facility] tomorrow for additional IV fluid rehydration and potassium.     Nausea with vomiting After patient completed IV fluid rehydration while at the [facility] yesterday afternoon-he once again began vomiting profusely.  Patient was transferred to the emergency department for further evaluation and management.  While in the emergency department he received additional IV fluid rehydration as well.  Patient with continued nausea/vomiting; and some diarrhea as well.  Patient has had minimal appetite and very poor oral intake.  He appears very dehydrated today.  Patient will receive 1 L normal saline IV fluid rehydration today.  He will return to the [facility] tomorrow for additional IV fluid rehydration.  He was also encouraged to push fluids as much as possible.      Patient stated understanding of all instructions; and was in agreement with this plan of care. The patient knows to call the clinic with any problems, questions or concerns.   Review/collaboration with Dr. Alen Blew regarding  all aspects of patient's visit today.   Total time spent with patient was 40 minutes;  with greater than 75 percent of that time spent in face to face counseling regarding patient's symptoms,  and coordination of care and follow up.  Disclaimer: This note was dictated  with voice recognition software. Similar sounding words can inadvertently be transcribed and may not be corrected upon review.   Drue Second, NP 11/28/2014

## 2014-11-28 NOTE — Assessment & Plan Note (Signed)
Potassium down to 3.0 today.  Most likely, hypokalemia is secondary to nausea/vomiting and diarrhea.  Patient was given oral potassium today; as well as potassium IV.

## 2014-11-28 NOTE — Assessment & Plan Note (Addendum)
Potassium down to 3.0 today.  Most likely, hypokalemia is secondary to nausea/vomiting and diarrhea.  Patient was given oral potassium today; and has potassium tablets to take at home as well.

## 2014-11-28 NOTE — Assessment & Plan Note (Signed)
Patient has had multiple episodes of diarrhea; but was prescribed Lomotil yesterday.  He states that his diarrhea has slightly improved over the past 24 hours.  However, he does continue to appear fairly dehydrated; will receive IV fluid rehydration today.

## 2014-11-28 NOTE — Assessment & Plan Note (Signed)
Patient has had a generalized pustular rash since initiating Afatinib oral therapy. He has been taking doxycycline with only minimal effectiveness.  Since patient discontinued his oral chemotherapy agent this past Wednesday, 11/23/2014-rash has greatly improved.  Hopefully, the rash will completely resolve while patient on a chemotherapy holiday.

## 2014-11-28 NOTE — Progress Notes (Signed)
SYMPTOM MANAGEMENT CLINIC   HPI: Chad George 44 y.o. male diagnosed with lung cancer; with metastasis to both the brain and the bone.  Patient completed whole brain irradiation on 11/08/2014.  Recently discontinued Afatinib oral chemotherapy due to multiple side effects.  Patient presents back to the [facility] today for additional IV fluid rehydration.  He states that his diarrhea has essentially resolved; but he continues nauseous and occasionally vomiting.  Patient was prescribed Zofran ODT and Lomotil.  Patient was also found to be hypokalemic yesterday; and was given potassium; as well as a prescription for potassium tablets.  Patient denies any recent fevers or chills.   Since discontinuing the oral chemotherapy agent-patient's diarrhea has essentially resolved and his nausea/vomiting is much better under control.  Also, patients generalized pustular rash has greatly improved as well.  HPI  ROS  Past Medical History  Diagnosis Date  . High cholesterol   . Pneumonia   . Adenocarcinoma 11/21/2013  . Brain cancer 10/10/14 MRI    40 lesions mets  . Allergy     Past Surgical History  Procedure Laterality Date  . Carpal tunner    . Tendon repair    . Video bronchoscopy Bilateral 11/17/2013    Procedure: VIDEO BRONCHOSCOPY WITH FLUORO;  Surgeon: Rush Farmer, MD;  Location: [facility];  Service: Cardiopulmonary;  Laterality: Bilateral;  . Video bronchoscopy with endobronchial ultrasound N/A 11/19/2013    Procedure: VIDEO BRONCHOSCOPY WITH ENDOBRONCHIAL ULTRASOUND;  Surgeon: Collene Gobble, MD;  Location: [facility];  Service: Thoracic;  Laterality: N/A;    has HERPES LABIALIS; ALLERGIC RHINITIS; Pulmonary nodules/lesions, multiple; Left pulmonary infiltrate on CXR; Hyperglycemia; Dyslipidemia; Mediastinal lymphadenopathy; Adenocarcinoma; Paronychia of great toe of left foot; Rash and nonspecific skin eruption; Lung cancer; Cancer associated pain; Cough; Paresthesia; Brain  metastasis; Anorexia; Weight loss; Nausea with vomiting; Diarrhea; Dehydration; Hypokalemia; Hypoalbuminemia; and Mucositis on his problem list.    is allergic to amoxicillin-pot clavulanate and penicillins.    Medication List       This list is accurate as of: 11/25/14 11:59 PM.  Always use your most recent med list.               acetaminophen 325 MG tablet  Commonly known as:  TYLENOL  Take 650 mg by mouth every 6 (six) hours as needed for mild pain.     afatinib dimaleate 40 MG tablet  Commonly known as:  GILOTRIF  Take 1 tablet (40 mg total) by mouth daily. Take on an empty stomach 1hr before or 2 hrs after meals.     afatinib dimaleate 40 MG tablet  Commonly known as:  GILOTRIF  Take 1 tablet (40 mg total) by mouth daily. Take on an empty stomach 1hr before or 2 hrs after meals.     albuterol 108 (90 BASE) MCG/ACT inhaler  Commonly known as:  PROVENTIL HFA;VENTOLIN HFA  Inhale 2 puffs into the lungs every 6 (six) hours as needed for wheezing or shortness of breath.     benzonatate 100 MG capsule  Commonly known as:  TESSALON  Take 1 to 2 capsules by mouth every 8 hours as needed for cough     chlorpheniramine-HYDROcodone 10-8 MG/5ML Lqcr  Commonly known as:  TUSSIONEX PENNKINETIC ER  Take 5 mLs by mouth every 12 (twelve) hours as needed for cough.     ciprofloxacin 500 MG tablet  Commonly known as:  CIPRO  Take 1 tablet (500 mg total) by mouth 2 (two) times  daily.     dexamethasone 4 MG tablet  Commonly known as:  DECADRON  Take 1 tablet (4 mg total) by mouth 4 (four) times daily.     diphenoxylate-atropine 2.5-0.025 MG per tablet  Commonly known as:  LOMOTIL  Take 2 tablets by mouth 4 (four) times daily as needed for diarrhea or loose stools.     doxycycline 100 MG capsule  Commonly known as:  VIBRAMYCIN  Take 1 capsule (100 mg total) by mouth 2 (two) times daily.     fluticasone 50 MCG/ACT nasal spray  Commonly known as:  FLONASE  Place 2 sprays into  both nostrils daily.     guaiFENesin 600 MG 12 hr tablet  Commonly known as:  MUCINEX  Take 600 mg by mouth 2 (two) times daily as needed for cough or to loosen phlegm.     loratadine 10 MG tablet  Commonly known as:  CLARITIN  Take 1 tablet (10 mg total) by mouth daily.     metoCLOPramide 10 MG tablet  Commonly known as:  REGLAN  Take 1 tablet (10 mg total) by mouth every 6 (six) hours as needed for nausea or vomiting (nausea/headache).     ondansetron 8 MG disintegrating tablet  Commonly known as:  ZOFRAN ODT  Take 1 tablet (8 mg total) by mouth every 12 (twelve) hours as needed for nausea or vomiting.     ondansetron 8 MG tablet  Commonly known as:  ZOFRAN  Take 1 tablet (8 mg total) by mouth every 12 (twelve) hours as needed for nausea or vomiting.     oxyCODONE 5 MG immediate release tablet  Commonly known as:  Oxy IR/ROXICODONE  Take 1-2 tablets (5-10 mg total) by mouth every 6 (six) hours as needed for severe pain.     potassium chloride SA 20 MEQ tablet  Commonly known as:  K-DUR,KLOR-CON  Take 1 tablet (20 mEq total) by mouth daily.     PRESCRIPTION MEDICATION  Chemo chcc     temazepam 15 MG capsule  Commonly known as:  RESTORIL  Take 1 or 2 capsules by mouth at bed time for sleep         PHYSICAL EXAMINATION  Oncology Vitals 11/25/2014 11/24/2014 11/24/2014 11/23/2014 11/23/2014 11/23/2014 11/23/2014  Height - - - - - - -  Weight - - - - - - -  Weight (lbs) - - - - - - -  BMI (kg/m2) - - - - - - -  Temp 98.9 - 98.3 - - 98.6 -  Pulse 99 95 88 107 104 104 90  Resp - 18 18 - - 20 16  SpO2 - 98 100 100 100 99 97  BSA (m2) - - - - - - -   BP Readings from Last 3 Encounters:  11/25/14 114/72  11/24/14 106/71  11/23/14 116/69    Physical Exam  Constitutional: He is oriented to person, place, and time. Vital signs are normal. He appears malnourished and dehydrated. He appears unhealthy. He appears cachectic.  Patient appears fatigued, weak, frail, and  chronically ill.  HENT:  Head: Normocephalic and atraumatic.  Patient has oral lesions to both the sides of his tongue and his oral mucosa which appear quite tender.  No thrush on exam.  Eyes: Conjunctivae and EOM are normal. Pupils are equal, round, and reactive to light. Right eye exhibits no discharge. Left eye exhibits no discharge. No scleral icterus.  Neck: Normal range of motion. Neck supple. No JVD present.  No tracheal deviation present. No thyromegaly present.  Cardiovascular: Normal rate, regular rhythm, normal heart sounds and intact distal pulses.   Pulmonary/Chest: Effort normal and breath sounds normal. No respiratory distress. He has no wheezes. He has no rales. He exhibits no tenderness.  Abdominal: Soft. Bowel sounds are normal. He exhibits no distension and no mass. There is no tenderness. There is no rebound and no guarding.  Musculoskeletal: Normal range of motion. He exhibits no edema or tenderness.  Lymphadenopathy:    He has no cervical adenopathy.  Neurological: He is alert and oriented to person, place, and time. Gait normal.  Skin: Skin is warm and dry. Rash noted. There is erythema. No pallor.  Patient has a generalized pustular rash from his scalp to his feet.  Patient denies any specific pruritus to the rash.  Generalized rash has greatly improved within the past few days since patient has discontinued his oral chemotherapy agent.  Psychiatric: Affect normal.  Nursing note and vitals reviewed.   LABORATORY DATA:. Appointment on 11/25/2014  Component Date Value Ref Range Status  . Sodium 11/25/2014 137  136 - 145 mEq/L Final  . Potassium 11/25/2014 3.0* 3.5 - 5.1 mEq/L Final  . Chloride 11/25/2014 109  98 - 109 mEq/L Final  . CO2 11/25/2014 21* 22 - 29 mEq/L Final  . Glucose 11/25/2014 95  70 - 140 mg/dl Final  . BUN 11/25/2014 12.7  7.0 - 26.0 mg/dL Final  . Creatinine 11/25/2014 0.6* 0.7 - 1.3 mg/dL Final  . Calcium 11/25/2014 7.3* 8.4 - 10.4 mg/dL Final    . Anion Gap 11/25/2014 7  3 - 11 mEq/L Final  . EGFR 11/25/2014 >90  >90 ml/min/1.73 m2 Final   eGFR is calculated using the CKD-EPI Creatinine Equation (2009)  Orders Only on 11/25/2014  Component Date Value Ref Range Status  . Glucose 11/25/2014 Negative  Negative mg/dL Final  . Bilirubin (Urine) 11/25/2014 Negative  Negative Final  . Ketones 11/25/2014 Negative  Negative mg/dL Final  . Specific Gravity, Urine 11/25/2014 1.020  1.003 - 1.035 Final  . Blood 11/25/2014 Trace  Negative Final  . pH 11/25/2014 6.0  4.6 - 8.0 Final  . Protein 11/25/2014 < 30  Negative- <30 mg/dL Final  . Urobilinogen, UR 11/25/2014 0.2  0.2 - 1 mg/dL Final  . Nitrite 11/25/2014 Negative  Negative Final  . Leukocyte Esterase 11/25/2014 Negative  Negative Final  . RBC / HPF 11/25/2014 7-10  0 - 2 Final  . WBC, UA 11/25/2014 3-6  0 - 2 Final  . Bacteria, UA 11/25/2014 Moderate  Negative- Trace Final  . Epithelial Cells 11/25/2014 Few  Negative- Few Final  . Mucus, UA 11/25/2014 Moderate  Negative- Small Final  . Urine Culture, Routine 11/25/2014 Culture, Urine   Final   Comment: Final - ===== COLONY COUNT: ===== 1,000 COLONIES/ML Insignificant Growth   Appointment on 11/24/2014  Component Date Value Ref Range Status  . Sodium 11/24/2014 139  136 - 145 mEq/L Final  . Potassium 11/24/2014 3.0* 3.5 - 5.1 mEq/L Final  . Chloride 11/24/2014 108  98 - 109 mEq/L Final  . CO2 11/24/2014 21* 22 - 29 mEq/L Final  . Glucose 11/24/2014 102  70 - 140 mg/dl Final  . BUN 11/24/2014 14.8  7.0 - 26.0 mg/dL Final  . Creatinine 11/24/2014 0.8  0.7 - 1.3 mg/dL Final  . Calcium 11/24/2014 8.1* 8.4 - 10.4 mg/dL Final  . Anion Gap 11/24/2014 10  3 - 11 mEq/L Final  .  EGFR 11/24/2014 >90  >90 ml/min/1.73 m2 Final   eGFR is calculated using the CKD-EPI Creatinine Equation (2009)  Admission on 11/23/2014, Discharged on 11/23/2014  Component Date Value Ref Range Status  . Color, Urine 11/23/2014 AMBER* YELLOW Final    BIOCHEMICALS MAY BE AFFECTED BY COLOR  . APPearance 11/23/2014 CLOUDY* CLEAR Final  . Specific Gravity, Urine 11/23/2014 1.026  1.005 - 1.030 Final  . pH 11/23/2014 6.0  5.0 - 8.0 Final  . Glucose, UA 11/23/2014 NEGATIVE  NEGATIVE mg/dL Final  . Hgb urine dipstick 11/23/2014 LARGE* NEGATIVE Final  . Bilirubin Urine 11/23/2014 SMALL* NEGATIVE Final  . Ketones, ur 11/23/2014 NEGATIVE  NEGATIVE mg/dL Final  . Protein, ur 11/23/2014 30* NEGATIVE mg/dL Final  . Urobilinogen, UA 11/23/2014 0.2  0.0 - 1.0 mg/dL Final  . Nitrite 11/23/2014 NEGATIVE  NEGATIVE Final  . Leukocytes, UA 11/23/2014 NEGATIVE  NEGATIVE Final  . Lactic Acid, Venous 11/23/2014 0.98  0.5 - 2.0 mmol/L Final  . Sodium 11/23/2014 136  135 - 145 mmol/L Final  . Potassium 11/23/2014 3.2* 3.5 - 5.1 mmol/L Final  . Chloride 11/23/2014 101  96 - 112 mmol/L Final  . BUN 11/23/2014 22  6 - 23 mg/dL Final  . Creatinine, Ser 11/23/2014 0.90  0.50 - 1.35 mg/dL Final  . Glucose, Bld 11/23/2014 118* 70 - 99 mg/dL Final  . Calcium, Ion 11/23/2014 1.13  1.12 - 1.23 mmol/L Final  . TCO2 11/23/2014 21  0 - 100 mmol/L Final  . Hemoglobin 11/23/2014 13.6  13.0 - 17.0 g/dL Final  . HCT 11/23/2014 40.0  39.0 - 52.0 % Final  . WBC 11/23/2014 11.9* 4.0 - 10.5 K/uL Final  . RBC 11/23/2014 4.64  4.22 - 5.81 MIL/uL Final  . Hemoglobin 11/23/2014 13.6  13.0 - 17.0 g/dL Final  . HCT 11/23/2014 38.2* 39.0 - 52.0 % Final  . MCV 11/23/2014 82.3  78.0 - 100.0 fL Final  . MCH 11/23/2014 29.3  26.0 - 34.0 pg Final  . MCHC 11/23/2014 35.6  30.0 - 36.0 g/dL Final  . RDW 11/23/2014 13.1  11.5 - 15.5 % Final  . Platelets 11/23/2014 502* 150 - 400 K/uL Final  . Neutrophils Relative % 11/23/2014 87* 43 - 77 % Final  . Neutro Abs 11/23/2014 10.3* 1.7 - 7.7 K/uL Final  . Lymphocytes Relative 11/23/2014 7* 12 - 46 % Final  . Lymphs Abs 11/23/2014 0.8  0.7 - 4.0 K/uL Final  . Monocytes Relative 11/23/2014 6  3 - 12 % Final  . Monocytes Absolute 11/23/2014 0.8   0.1 - 1.0 K/uL Final  . Eosinophils Relative 11/23/2014 0  0 - 5 % Final  . Eosinophils Absolute 11/23/2014 0.1  0.0 - 0.7 K/uL Final  . Basophils Relative 11/23/2014 0  0 - 1 % Final  . Basophils Absolute 11/23/2014 0.0  0.0 - 0.1 K/uL Final  . Magnesium 11/23/2014 1.8  1.5 - 2.5 mg/dL Final  . Troponin I 11/23/2014 <0.03  <0.031 ng/mL Final   Comment:        NO INDICATION OF MYOCARDIAL INJURY.   [facility] Squamous Epithelial / LPF 11/23/2014 RARE  RARE Final  . WBC, UA 11/23/2014 0-2  <3 WBC/hpf Final  . RBC / HPF 11/23/2014 TOO NUMEROUS TO COUNT  <3 RBC/hpf Final  . Urine-Other 11/23/2014 MUCOUS PRESENT   Final  Appointment on 11/23/2014  Component Date Value Ref Range Status  . WBC 11/23/2014 14.0* 4.0 - 10.3 10e3/uL Final  . NEUT# 11/23/2014 12.4*  1.5 - 6.5 10e3/uL Final  . HGB 11/23/2014 14.0  13.0 - 17.1 g/dL Final  . HCT 11/23/2014 41.0  38.4 - 49.9 % Final  . Platelets 11/23/2014 552* 140 - 400 10e3/uL Final  . MCV 11/23/2014 83.0  79.3 - 98.0 fL Final  . MCH 11/23/2014 28.4  27.2 - 33.4 pg Final  . MCHC 11/23/2014 34.2  32.0 - 36.0 g/dL Final  . RBC 11/23/2014 4.93  4.20 - 5.82 10e6/uL Final  . RDW 11/23/2014 13.6  11.0 - 14.6 % Final  . lymph# 11/23/2014 0.8* 0.9 - 3.3 10e3/uL Final  . MONO# 11/23/2014 0.8  0.1 - 0.9 10e3/uL Final  . Eosinophils Absolute 11/23/2014 0.0  0.0 - 0.5 10e3/uL Final  . Basophils Absolute 11/23/2014 0.0  0.0 - 0.1 10e3/uL Final  . NEUT% 11/23/2014 88.4* 39.0 - 75.0 % Final  . LYMPH% 11/23/2014 5.5* 14.0 - 49.0 % Final  . MONO% 11/23/2014 5.5  0.0 - 14.0 % Final  . EOS% 11/23/2014 0.3  0.0 - 7.0 % Final  . BASO% 11/23/2014 0.3  0.0 - 2.0 % Final  . Sodium 11/23/2014 135* 136 - 145 mEq/L Final  . Potassium 11/23/2014 3.0* 3.5 - 5.1 mEq/L Final  . Chloride 11/23/2014 104  98 - 109 mEq/L Final  . CO2 11/23/2014 21* 22 - 29 mEq/L Final  . Glucose 11/23/2014 131  70 - 140 mg/dl Final  . BUN 11/23/2014 17.7  7.0 - 26.0 mg/dL Final  . Creatinine  11/23/2014 0.8  0.7 - 1.3 mg/dL Final  . Total Bilirubin 11/23/2014 0.94  0.20 - 1.20 mg/dL Final  . Alkaline Phosphatase 11/23/2014 137  40 - 150 U/L Final  . AST 11/23/2014 30  5 - 34 U/L Final  . ALT 11/23/2014 57* 0 - 55 U/L Final  . Total Protein 11/23/2014 5.2* 6.4 - 8.3 g/dL Final  . Albumin 11/23/2014 2.4* 3.5 - 5.0 g/dL Final  . Calcium 11/23/2014 8.4  8.4 - 10.4 mg/dL Final  . Anion Gap 11/23/2014 10  3 - 11 mEq/L Final  . EGFR 11/23/2014 >90  >90 ml/min/1.73 m2 Final   eGFR is calculated using the CKD-EPI Creatinine Equation (2009)     RADIOGRAPHIC STUDIES: No results found.  ASSESSMENT/PLAN:    Dehydration Patient has received IV fluids here at the [facility] for the past 2 days; and does appear less dehydrated today than on previous days.  Patient is happy to report that his diarrhea has essentially resolved as well.  Patient will receive 1 L normal saline IV fluid rehydration while at the [facility] again today.  He was also encouraged to push fluids is much as possible.  He may very well need additional IV fluid rehydration next week as well.      Diarrhea Patient has been taking Lomotil an alternating with Imodium for his diarrhea.  He states that his diarrhea has essentially resolved at this point.   Hypokalemia Potassium down to 3.0 today.  Most likely, hypokalemia is secondary to nausea/vomiting and diarrhea.  Patient was given oral potassium today; and has potassium tablets to take at home as well.       Lung cancer Patient completed whole brain irradiation on 11/08/2014. Patient has been undergoing Afatinib oral therapy on a daily basis.  Due to progressive nausea/vomiting, diarrhea, anorexia/weight loss, and dehydration-patient was advised to hold any further Afatinib oral therapy for the time being.   Since discontinuing patient's oral chemotherapy agent-patient's diarrhea has essentially resolved.  Patient has been receiving daily IV fluid  rehydration for the past 2 days.  He does appear much less dehydrated today   He still complains of chronic nausea; but only occasional vomiting now.  He has been taken Zofran ODT at home.  Patient will receive 1 L normal saline IV fluid rehydration again today while at the cancer center.  Also, he was encouraged to push fluids is much as possible.  Also, patients generalized pustular rash has improved somewhat since this past Wednesday when he discontinued his oral chemotherapy agent.  Patient is scheduled to return for labs and a follow-up visit on 11/30/2014.      Nausea with vomiting Patient has received IV fluids here at the [facility] for the past 2 days; and does appear less dehydrated today than on previous days.  Patient is happy to report that his diarrhea has essentially resolved as well.  Patient will receive 1 L normal saline IV fluid rehydration while at the [facility] again today.  He was also encouraged to push fluids is much as possible.  He may very well need additional IV fluid rehydration next week as well.     Rash and nonspecific skin eruption Patient has had a generalized pustular rash since initiating Afatinib oral therapy. He has been taking doxycycline with only minimal effectiveness.  Since patient discontinued his oral chemotherapy agent this past Wednesday, 11/23/2014-rash has greatly improved.  Hopefully, the rash will completely resolve while patient on a chemotherapy holiday.     Patient stated understanding of all instructions; and was in agreement with this plan of care. The patient knows to call the clinic with any problems, questions or concerns.   Review/collaboration with Dr. Alen Blew regarding all aspects of patient's visit today.   Total time spent with patient was 40 minutes;  with greater than 75 percent of that time spent in face to face counseling regarding patient's symptoms,  and coordination of care and follow up.  Disclaimer: This note  was dictated with voice recognition software. Similar sounding words can inadvertently be transcribed and may not be corrected upon review.   Drue Second, NP 11/28/2014

## 2014-11-28 NOTE — Assessment & Plan Note (Signed)
After patient completed IV fluid rehydration while at the cancer Center yesterday afternoon-he once again began vomiting profusely.  Patient was transferred to the emergency department for further evaluation and management.  While in the emergency department he received additional IV fluid rehydration as well.  Patient with continued nausea/vomiting; and some diarrhea as well.  Patient has had minimal appetite and very poor oral intake.  He appears very dehydrated today.  Patient will receive 1 L normal saline IV fluid rehydration today.  He will return to the [facility] tomorrow for additional IV fluid rehydration.  He was also encouraged to push fluids as much as possible.

## 2014-11-28 NOTE — Telephone Encounter (Signed)
Pt states he is eating and drinking good, he feels a lot better. His feet are swollen and painful. Will talk with cyndee and call pt back.

## 2014-11-30 ENCOUNTER — Telehealth: Payer: Self-pay | Admitting: Oncology

## 2014-11-30 ENCOUNTER — Other Ambulatory Visit (HOSPITAL_BASED_OUTPATIENT_CLINIC_OR_DEPARTMENT_OTHER): Payer: Self-pay

## 2014-11-30 ENCOUNTER — Other Ambulatory Visit: Payer: Self-pay | Admitting: *Deleted

## 2014-11-30 ENCOUNTER — Ambulatory Visit (HOSPITAL_BASED_OUTPATIENT_CLINIC_OR_DEPARTMENT_OTHER): Payer: Self-pay | Admitting: Oncology

## 2014-11-30 ENCOUNTER — Encounter: Payer: Self-pay | Admitting: *Deleted

## 2014-11-30 VITALS — BP 95/62 | HR 95 | Temp 98.1°F | Resp 18 | Ht 66.0 in | Wt 141.1 lb

## 2014-11-30 DIAGNOSIS — E876 Hypokalemia: Secondary | ICD-10-CM

## 2014-11-30 DIAGNOSIS — C3490 Malignant neoplasm of unspecified part of unspecified bronchus or lung: Secondary | ICD-10-CM

## 2014-11-30 DIAGNOSIS — K5900 Constipation, unspecified: Secondary | ICD-10-CM

## 2014-11-30 DIAGNOSIS — L03031 Cellulitis of right toe: Secondary | ICD-10-CM

## 2014-11-30 DIAGNOSIS — C801 Malignant (primary) neoplasm, unspecified: Secondary | ICD-10-CM

## 2014-11-30 DIAGNOSIS — R21 Rash and other nonspecific skin eruption: Secondary | ICD-10-CM

## 2014-11-30 DIAGNOSIS — L03032 Cellulitis of left toe: Secondary | ICD-10-CM

## 2014-11-30 DIAGNOSIS — C3412 Malignant neoplasm of upper lobe, left bronchus or lung: Secondary | ICD-10-CM

## 2014-11-30 DIAGNOSIS — C7931 Secondary malignant neoplasm of brain: Secondary | ICD-10-CM

## 2014-11-30 LAB — COMPREHENSIVE METABOLIC PANEL (CC13)
ALT: 45 U/L (ref 0–55)
ANION GAP: 7 meq/L (ref 3–11)
AST: 37 U/L — ABNORMAL HIGH (ref 5–34)
Albumin: 2.1 g/dL — ABNORMAL LOW (ref 3.5–5.0)
Alkaline Phosphatase: 131 U/L (ref 40–150)
BUN: 7 mg/dL (ref 7.0–26.0)
CALCIUM: 7.7 mg/dL — AB (ref 8.4–10.4)
CO2: 24 meq/L (ref 22–29)
CREATININE: 0.6 mg/dL — AB (ref 0.7–1.3)
Chloride: 103 mEq/L (ref 98–109)
Glucose: 95 mg/dl (ref 70–140)
Potassium: 3.7 mEq/L (ref 3.5–5.1)
Sodium: 135 mEq/L — ABNORMAL LOW (ref 136–145)
Total Bilirubin: 0.72 mg/dL (ref 0.20–1.20)
Total Protein: 4.5 g/dL — ABNORMAL LOW (ref 6.4–8.3)

## 2014-11-30 LAB — CBC WITH DIFFERENTIAL/PLATELET
BASO%: 0.3 % (ref 0.0–2.0)
BASOS ABS: 0 10*3/uL (ref 0.0–0.1)
EOS%: 2.4 % (ref 0.0–7.0)
Eosinophils Absolute: 0.2 10*3/uL (ref 0.0–0.5)
HCT: 34 % — ABNORMAL LOW (ref 38.4–49.9)
HEMOGLOBIN: 11.9 g/dL — AB (ref 13.0–17.1)
LYMPH#: 0.8 10*3/uL — AB (ref 0.9–3.3)
LYMPH%: 11.5 % — ABNORMAL LOW (ref 14.0–49.0)
MCH: 29 pg (ref 27.2–33.4)
MCHC: 35 g/dL (ref 32.0–36.0)
MCV: 82.7 fL (ref 79.3–98.0)
MONO#: 0.7 10*3/uL (ref 0.1–0.9)
MONO%: 9.2 % (ref 0.0–14.0)
NEUT%: 76.6 % — ABNORMAL HIGH (ref 39.0–75.0)
NEUTROS ABS: 5.5 10*3/uL (ref 1.5–6.5)
Platelets: 285 10*3/uL (ref 140–400)
RBC: 4.11 10*6/uL — AB (ref 4.20–5.82)
RDW: 13.4 % (ref 11.0–14.6)
WBC: 7.2 10*3/uL (ref 4.0–10.3)

## 2014-11-30 MED ORDER — LACTULOSE 20 GM/30ML PO SOLN: ORAL | Status: AC

## 2014-11-30 MED ORDER — AFATINIB DIMALEATE 20 MG PO TABS: 20.0000 mg | ORAL_TABLET | Freq: Every day | ORAL | Status: DC

## 2014-11-30 NOTE — Telephone Encounter (Signed)
Pt confirmed labs/ov per 03/23 POF, gave pt AVS and Calendar..... KJ, gave pt barium

## 2014-11-30 NOTE — Telephone Encounter (Signed)
Faxed new script for afatinib, [facility] [phone]

## 2014-11-30 NOTE — Progress Notes (Signed)
Hematology and Oncology Follow Up Visit  Chad George [id number] [birth year] 44 y.o. 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 He presented with left upper lobe lung mass measuring 5.4 x 4.7 x 3.7 cm and bilateral pulmonary nodules. Pathology indicating adenocarcinoma of a lung primary. 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  Prior Therapy:  Tarceva 150 mg daily started in March of 2015. Placed on hold 12/14/13 due to rash. This was resumed again on 12/24/2013. Discontinued 10/05/2014 secondary to disease progression within the chest as well as multiple brain metastasis. Whole brain irradiation under the care of Dr. Lisbeth Renshaw expected to be completed on 11/08/2014   Current therapy:   1. Gilotrif (afatinib) 40 mg by mouth daily Therapy beginning approximately 10/05/2014. Therapy interrupted on 11/23/2014.  Interim History:  Mr. Chad George presents today for routine followup with his wife. He completed whole brain radiation under the care of Dr. Lisbeth Renshaw without any complications. He is completing 6 weeks of afatinib 40 mg by mouth daily with severe complications. These include severe acneiform rash, poor by mouth intake, mucositis, dehydration and nail changes. Therapy was interrupted on 11/23/2014. He did require intravenous hydration on multiple occasions as well as potassium replacement.   He continues to have issues with paronychia involving both great toes and his thumbs.  He is not reporting any pain in his chest or difficulty breathing. Since the discontinuation of his medication, he has felt much better but have not returned back to baseline. He is reporting constipation and have not had a bowel movement in 5 days.  He is not reporting any headaches or blurry vision does not report any alteration of mental status  psychiatric issues of depression. Is not reporting any chest pain or palpitation. Does not report any nausea or vomiting abdominal pain. Has not reported any hematochezia or melena. Has not reported any frequency urgency or hesitancy. Does not report any lymphadenopathy or petechiae. He does not report any skeletal pain.he does report paronychia affecting his toes and fingernails.  Remainder of his review of system is unremarkable.  Medications: I have reviewed the patient's current medications.  Current Outpatient Prescriptions  Medication Sig Dispense Refill  . acetaminophen (TYLENOL) 325 MG tablet Take 650 mg by mouth every 6 (six) hours as needed for mild pain.     Marland Kitchen albuterol (PROVENTIL HFA;VENTOLIN HFA) 108 (90 BASE) MCG/ACT inhaler Inhale 2 puffs into the lungs every 6 (six) hours as needed for wheezing or shortness of breath. 1 Inhaler 1  . benzonatate (TESSALON) 100 MG capsule Take 1 to 2 capsules by mouth every 8 hours as needed for cough 45 capsule 2  . chlorpheniramine-HYDROcodone (TUSSIONEX PENNKINETIC ER) 10-8 MG/5ML LQCR Take 5 mLs by mouth every 12 (twelve) hours as needed for cough. 1 Bottle 0  . diphenoxylate-atropine (LOMOTIL) 2.5-0.025 MG per tablet Take 2 tablets by mouth 4 (four) times daily as needed for diarrhea or loose stools. 30 tablet 1  . doxycycline (VIBRAMYCIN) 100 MG capsule Take 1 capsule (100 mg total) by mouth 2 (two) times daily. 60 capsule 1  . fluticasone (FLONASE) 50 MCG/ACT nasal spray Place 2 sprays into both nostrils daily. 16 g 6  . guaiFENesin (MUCINEX) 600 MG 12 hr tablet Take 600 mg by mouth 2 (two) times daily as needed for cough or to loosen phlegm.     Marland Kitchen  loratadine (CLARITIN) 10 MG tablet Take 1 tablet (10 mg total) by mouth daily. (Patient taking differently: Take 10 mg by mouth daily as needed for allergies. ) 30 tablet 11  . metoCLOPramide (REGLAN) 10 MG tablet Take 1 tablet (10 mg total) by mouth every 6 (six) hours as needed for nausea or vomiting  (nausea/headache). 20 tablet 0  . ondansetron (ZOFRAN ODT) 8 MG disintegrating tablet Take 1 tablet (8 mg total) by mouth every 12 (twelve) hours as needed for nausea or vomiting. 30 tablet 0  . ondansetron (ZOFRAN) 8 MG tablet Take 1 tablet (8 mg total) by mouth every 12 (twelve) hours as needed for nausea or vomiting. 30 tablet 0  . oxyCODONE (OXY IR/ROXICODONE) 5 MG immediate release tablet Take 1-2 tablets (5-10 mg total) by mouth every 6 (six) hours as needed for severe pain. 30 tablet 0  . afatinib dimaleate (GILOTRIF) 20 MG tablet Take 1 tablet (20 mg total) by mouth daily. Take on an empty stomach 1hr before or 2hrs after meals. 30 tablet 0  . Lactulose 20 GM/30ML SOLN Take 30 cc by mouth every 4 hours as needed for constipation. 236 mL 1   No current facility-administered medications for this visit.     Allergies:  Allergies  Allergen Reactions  . Amoxicillin-Pot Clavulanate     unknown  . Penicillins     REACTION: RASH    Past Medical History, Surgical history, Social history, and Family History were reviewed and updated.    Physical Exam: Blood pressure 95/62, pulse 95, temperature 98.1 F (36.7 C), temperature source Oral, resp. rate 18, height _0  (1.676 m), weight 141 lb 1.6 oz (64.003 kg), SpO2 98 %. ECOG: 0 General appearance: alert and cooperative not in any distress. Head: Normocephalic, no masses or lesions. Neck: no adenopathy, no thyroid masses. Lymph nodes: Cervical, supraclavicular, and axillary nodes normal. Heart:regular rate and rhythm, S1, S2 normal, no murmur, click, rub or gallop Lung:bilateral basilar rales heard. No wheezing or stridor.  Abdomen: soft, non-tender, without masses or organomegaly EXT:no erythema, induration, or nodules Skin: Diffuse acneiform rash on the face chest and back have decreased in intensity without any erythema or induration.  He has discoloration of his skin on on the scalp as well.  Lab Results: Lab Results   Component Value Date   WBC 7.2 11/30/2014   HGB 11.9* 11/30/2014   HCT 34.0* 11/30/2014   MCV 82.7 11/30/2014   PLT 285 11/30/2014     Chemistry      Component Value Date/Time   NA 135* 11/30/2014 1134   NA 136 11/23/2014 1844   K 3.7 11/30/2014 1134   K 3.2* 11/23/2014 1844   CL 101 11/23/2014 1844   CO2 24 11/30/2014 1134   CO2 22 11/18/2013 0457   BUN 7.0 11/30/2014 1134   BUN 22 11/23/2014 1844   CREATININE 0.6* 11/30/2014 1134   CREATININE 0.90 11/23/2014 1844   CREATININE 0.69 08/16/2013 1727      Component Value Date/Time   CALCIUM 7.7* 11/30/2014 1134   CALCIUM 9.1 11/18/2013 0457   ALKPHOS 131 11/30/2014 1134   ALKPHOS 124* 11/16/2013 0630   AST 37* 11/30/2014 1134   AST 18 11/16/2013 0630   ALT 45 11/30/2014 1134   ALT 17 11/16/2013 0630   BILITOT 0.72 11/30/2014 1134   BILITOT 0.6 11/16/2013 0630      Impression and Plan:   44 year-old gentleman with the following issues:  1. Stage IV adenocarcinoma of the  lung with a left lung mass and bilateral pulmonary nodules. His tumor is EGFR mutated but the ALK mutation was not detected.    CT scan on 10/04/2014  showed progression of disease on Tarceva.. His parenchymal metastasis as well as lymph node metastasis have increased. After 6 weeks of Afatinib, he experienced a number of side effects that required treatment interruption. The plan is to resume this medication at a lower dose of 20 mg daily on 12/09/2014. Alternative therapies were offered to the patient but we will elected to proceed with this approach. If he tolerates this medication poorly even at a lower dose, we will use a different regimen. I will repeat imaging studies in April 2016.  2.  Acneiform rash: Seems to be improving with drug holiday. He continues to use hydrocortisone cream.  4. Paronychia-likely related to Gilotrif therapy. Continue Epsom salt soaks, continue to monitor closely for signs or symptoms of infection. No evidence of  cellulitis.  5. Constipation: I gave him prescription for lactulose with instructions to use it. It is a risk of developing diarrhea once he resumes his anticancer medication.  6. Hypokalemia: His potassium of normalizes 3.7.  7. Follow up:  In 4 4 weeks to discuss results of his CT scan.  [facility], MD 3/23/201612:24 PM

## 2014-11-30 NOTE — Progress Notes (Signed)
[facility] to fill script for afatinib, faxed to [facility]

## 2014-12-01 ENCOUNTER — Encounter: Payer: Self-pay | Admitting: Oncology

## 2014-12-01 NOTE — Progress Notes (Signed)
I faxed new scrip to [facility] [phone] for Gilotrif

## 2014-12-16 ENCOUNTER — Encounter: Payer: Self-pay | Admitting: Radiation Oncology

## 2014-12-21 ENCOUNTER — Telehealth: Payer: Self-pay | Admitting: *Deleted

## 2014-12-21 NOTE — Telephone Encounter (Signed)
patient wanted to make sure his appt for tomorrow was still on at 4pm, yes, will see him tomorrow,patient thaked this RN for call ing back so sooon 10:17 AM

## 2014-12-22 ENCOUNTER — Ambulatory Visit
Admission: RE | Admit: 2014-12-22 | Discharge: 2014-12-22 | Disposition: A | Discharge status: Home / Self Care | Payer: Self-pay | Source: Ambulatory Visit | Attending: Radiation Oncology | Admitting: Radiation Oncology

## 2014-12-22 ENCOUNTER — Encounter: Payer: Self-pay | Admitting: Radiation Oncology

## 2014-12-22 VITALS — BP 113/74 | HR 80 | Temp 98.2°F | Resp 20 | Ht 66.0 in | Wt 146.2 lb

## 2014-12-22 DIAGNOSIS — C7931 Secondary malignant neoplasm of brain: Secondary | ICD-10-CM

## 2014-12-22 HISTORY — DX: Personal history of irradiation: Z92.3

## 2014-12-22 NOTE — Progress Notes (Signed)
Radiation Oncology         [phone] ________________________________  Name: Chad George MRN: [id number]  Date: 12/22/2014  DOB: 03-13-1971  Follow-Up Visit Note  CC: [name], MD  Wyatt Portela, MD  Diagnosis:   Metastatic adenocarcinoma of the lung  Interval Since Last Radiation:  One month   Narrative:  The patient returns today for routine follow-up.  The patient states that he is doing very well. Pain much improved. He still has occasional pain in the left flank but this is much improved. He feels some tiredness in the upper back/shoulders but overall feeling much better. He is continuing with systemic treatment and has scans and follow-up with medical oncology next week.                              ALLERGIES:  is allergic to amoxicillin-pot clavulanate and penicillins.  Meds: Current Outpatient Prescriptions  Medication Sig Dispense Refill  . acetaminophen (TYLENOL) 325 MG tablet Take 650 mg by mouth every 6 (six) hours as needed for mild pain.     Marland Kitchen afatinib dimaleate (GILOTRIF) 20 MG tablet Take 1 tablet (20 mg total) by mouth daily. Take on an empty stomach 1hr before or 2hrs after meals. 30 tablet 0  . albuterol (PROVENTIL HFA;VENTOLIN HFA) 108 (90 BASE) MCG/ACT inhaler Inhale 2 puffs into the lungs every 6 (six) hours as needed for wheezing or shortness of breath. 1 Inhaler 1  . benzonatate (TESSALON) 100 MG capsule Take 1 to 2 capsules by mouth every 8 hours as needed for cough 45 capsule 2  . chlorpheniramine-HYDROcodone (TUSSIONEX PENNKINETIC ER) 10-8 MG/5ML LQCR Take 5 mLs by mouth every 12 (twelve) hours as needed for cough. 1 Bottle 0  . diphenoxylate-atropine (LOMOTIL) 2.5-0.025 MG per tablet Take 2 tablets by mouth 4 (four) times daily as needed for diarrhea or loose stools. 30 tablet 1  . doxycycline (VIBRAMYCIN) 100 MG capsule Take 1 capsule (100 mg total) by mouth 2 (two) times daily. 60 capsule 1  . fluticasone (FLONASE) 50 MCG/ACT nasal spray  Place 2 sprays into both nostrils daily. 16 g 6  . guaiFENesin (MUCINEX) 600 MG 12 hr tablet Take 600 mg by mouth 2 (two) times daily as needed for cough or to loosen phlegm.     . Lactulose 20 GM/30ML SOLN Take 30 cc by mouth every 4 hours as needed for constipation. 236 mL 1  . loratadine (CLARITIN) 10 MG tablet Take 1 tablet (10 mg total) by mouth daily. (Patient taking differently: Take 10 mg by mouth daily as needed for allergies. ) 30 tablet 11  . metoCLOPramide (REGLAN) 10 MG tablet Take 1 tablet (10 mg total) by mouth every 6 (six) hours as needed for nausea or vomiting (nausea/headache). 20 tablet 0  . ondansetron (ZOFRAN ODT) 8 MG disintegrating tablet Take 1 tablet (8 mg total) by mouth every 12 (twelve) hours as needed for nausea or vomiting. 30 tablet 0  . ondansetron (ZOFRAN) 8 MG tablet Take 1 tablet (8 mg total) by mouth every 12 (twelve) hours as needed for nausea or vomiting. 30 tablet 0  . oxyCODONE (OXY IR/ROXICODONE) 5 MG immediate release tablet Take 1-2 tablets (5-10 mg total) by mouth every 6 (six) hours as needed for severe pain. 30 tablet 0   No current facility-administered medications for this encounter.    Physical Findings: The patient is in no acute distress. Patient is alert and  oriented.  height is 5\' 6"  (1.676 m) and weight is 146 lb 3.2 oz (66.316 kg). His oral temperature is 98.2 F (36.8 C). His blood pressure is 113/74 and his pulse is 80. His respiration is 20 and oxygen saturation is 100%. .   Patient's scan shows alopecia with some hyperpigmentation.  Lab Findings: Lab Results  Component Value Date   WBC 7.2 11/30/2014   HGB 11.9* 11/30/2014   HCT 34.0* 11/30/2014   MCV 82.7 11/30/2014   PLT 285 11/30/2014     Radiographic Findings: Dg Chest 2 View  11/23/2014   CLINICAL DATA:  44 year old male with a history of pneumonia, adenocarcinoma, metastatic to brain.  EXAM: CHEST - 2 VIEW  COMPARISON:  CT chest 10/04/2014, plain film 11/18/2013   FINDINGS: Cardiomediastinal silhouette unchanged in size and contour.  Left hilar opacities, with linear opacities of the left lung. This is improved compared to the airspace disease on the chest x-ray dated 11/18/2013. This correlates with findings on the CT dated 10/04/2014 of left hilar mass.  Mixed nodular and reticular opacities of the retrocardiac region.  No confluent airspace disease.  No pleural effusion or pneumothorax.  Nodule of the right upper lobe, likely correlates to nodule on the prior CT.  No acute bony abnormality.  IMPRESSION: Nodular reticular opacities the retrocardiac region, similar to findings on prior CT of the chest. This may reflect carcinomatosis, or potentially a superimposed pneumonia.  Left hilar opacities, consistent with recurrent left hilar mass evident on prior CT.  Nodule of the right upper lobe, correlating no findings on prior CT.  Signed,  Dulcy Fanny. Earleen Newport, DO  [facility]  [facility]   Electronically Signed   By: Corrie Mckusick D.O.   On: 11/23/2014 19:50    Impression:    The patient is doing well clinically. He is not on steroids and is not having any headaches. Pain much improved.  Plan:  The patient will undergo a MRI scan of the brain in 2 months with subsequent follow-up.   Jodelle Gross, M.D., Ph.D.

## 2014-12-22 NOTE — Progress Notes (Signed)
Follow up s/p whole brain ,rt shoulder and left abdon/spleen, no nausea, no head aches, pain in both shoulders, gets tired easily if he trys to exercise, appetite great, energy level better,  Ct sheduled 12/28/14 and f/u with Dr. Clement Husbands 12/30/14 4:09 PM BP 113/74 mmHg  Pulse 80  Temp(Src) 98.2 F (36.8 C) (Oral)  Resp 20  Ht 5\' 6"  (1.676 m)  Wt 146 lb 3.2 oz (66.316 kg)  BMI 23.61 kg/m2  SpO2 100%  Wt Readings from Last 3 Encounters:  11/30/14 141 lb 1.6 oz (64.003 kg)  11/23/14 137 lb 14.4 oz (62.551 kg)  11/02/14 148 lb 1.6 oz (67.178 kg)

## 2014-12-28 ENCOUNTER — Other Ambulatory Visit (HOSPITAL_BASED_OUTPATIENT_CLINIC_OR_DEPARTMENT_OTHER): Payer: Self-pay

## 2014-12-28 ENCOUNTER — Encounter (HOSPITAL_COMMUNITY): Payer: Self-pay

## 2014-12-28 ENCOUNTER — Ambulatory Visit (HOSPITAL_COMMUNITY)
Admission: RE | Admit: 2014-12-28 | Discharge: 2014-12-28 | Disposition: A | Discharge status: Home / Self Care | Payer: Self-pay | Source: Ambulatory Visit | Attending: Oncology | Admitting: Oncology

## 2014-12-28 DIAGNOSIS — C3412 Malignant neoplasm of upper lobe, left bronchus or lung: Secondary | ICD-10-CM

## 2014-12-28 DIAGNOSIS — C801 Malignant (primary) neoplasm, unspecified: Secondary | ICD-10-CM

## 2014-12-28 DIAGNOSIS — C3490 Malignant neoplasm of unspecified part of unspecified bronchus or lung: Secondary | ICD-10-CM | POA: Insufficient documentation

## 2014-12-28 DIAGNOSIS — C7931 Secondary malignant neoplasm of brain: Secondary | ICD-10-CM

## 2014-12-28 LAB — COMPREHENSIVE METABOLIC PANEL (CC13)
ALK PHOS: 98 U/L (ref 40–150)
ALT: 21 U/L (ref 0–55)
AST: 18 U/L (ref 5–34)
Albumin: 3.4 g/dL — ABNORMAL LOW (ref 3.5–5.0)
Anion Gap: 13 mEq/L — ABNORMAL HIGH (ref 3–11)
BUN: 7.4 mg/dL (ref 7.0–26.0)
CHLORIDE: 102 meq/L (ref 98–109)
CO2: 24 meq/L (ref 22–29)
CREATININE: 0.7 mg/dL (ref 0.7–1.3)
Calcium: 8.7 mg/dL (ref 8.4–10.4)
EGFR: >90 mL/min/{1.73_m2} (ref >90)
GLUCOSE: 94 mg/dL (ref 70–140)
POTASSIUM: 3.7 meq/L (ref 3.5–5.1)
Sodium: 138 mEq/L (ref 136–145)
Total Bilirubin: 1.08 mg/dL (ref 0.20–1.20)
Total Protein: 6 g/dL — ABNORMAL LOW (ref 6.4–8.3)

## 2014-12-28 LAB — CBC WITH DIFFERENTIAL/PLATELET
BASO%: 0.4 % (ref 0.0–2.0)
BASOS ABS: 0 10*3/uL (ref 0.0–0.1)
EOS ABS: 0 10*3/uL (ref 0.0–0.5)
EOS%: 0.8 % (ref 0.0–7.0)
HCT: 37.6 % — ABNORMAL LOW (ref 38.4–49.9)
HGB: 12.8 g/dL — ABNORMAL LOW (ref 13.0–17.1)
LYMPH#: 1.1 10*3/uL (ref 0.9–3.3)
LYMPH%: 18.2 % (ref 14.0–49.0)
MCH: 29.6 pg (ref 27.2–33.4)
MCHC: 34.1 g/dL (ref 32.0–36.0)
MCV: 86.9 fL (ref 79.3–98.0)
MONO#: 0.4 10*3/uL (ref 0.1–0.9)
MONO%: 7.4 % (ref 0.0–14.0)
NEUT%: 73.2 % (ref 39.0–75.0)
NEUTROS ABS: 4.4 10*3/uL (ref 1.5–6.5)
Platelets: 231 10*3/uL (ref 140–400)
RBC: 4.32 10*6/uL (ref 4.20–5.82)
RDW: 15.2 % — AB (ref 11.0–14.6)
WBC: 6 10*3/uL (ref 4.0–10.3)

## 2014-12-28 MED ORDER — IOHEXOL 300 MG/ML  SOLN
100.0000 mL | Freq: Once | INTRAMUSCULAR | Status: AC | PRN
  Administered 2014-12-28: 100 mL via INTRAVENOUS

## 2014-12-30 ENCOUNTER — Ambulatory Visit (HOSPITAL_BASED_OUTPATIENT_CLINIC_OR_DEPARTMENT_OTHER): Payer: Self-pay | Admitting: Oncology

## 2014-12-30 ENCOUNTER — Telehealth: Payer: Self-pay | Admitting: Oncology

## 2014-12-30 VITALS — BP 99/61 | HR 98 | Temp 97.1°F | Resp 18 | Ht 66.0 in | Wt 145.9 lb

## 2014-12-30 DIAGNOSIS — C3412 Malignant neoplasm of upper lobe, left bronchus or lung: Secondary | ICD-10-CM

## 2014-12-30 DIAGNOSIS — K5900 Constipation, unspecified: Secondary | ICD-10-CM

## 2014-12-30 DIAGNOSIS — R21 Rash and other nonspecific skin eruption: Secondary | ICD-10-CM

## 2014-12-30 DIAGNOSIS — C7931 Secondary malignant neoplasm of brain: Secondary | ICD-10-CM

## 2014-12-30 DIAGNOSIS — E876 Hypokalemia: Secondary | ICD-10-CM

## 2014-12-30 DIAGNOSIS — C801 Malignant (primary) neoplasm, unspecified: Secondary | ICD-10-CM

## 2014-12-30 DIAGNOSIS — L03031 Cellulitis of right toe: Secondary | ICD-10-CM

## 2014-12-30 DIAGNOSIS — L03032 Cellulitis of left toe: Secondary | ICD-10-CM

## 2014-12-30 MED ORDER — DOXYCYCLINE HYCLATE 100 MG PO CAPS: 100.0000 mg | ORAL_CAPSULE | Freq: Two times a day (BID) | ORAL | Status: DC

## 2014-12-30 NOTE — Telephone Encounter (Signed)
Gave avs & calendar for May. °

## 2014-12-30 NOTE — Progress Notes (Signed)
Hematology and Oncology Follow Up Visit  Chad George [id number] [birth year] 44 y.o. 12/30/2014 1:49 PM [name], [name], MDJegede, [name], MD   Principle Diagnosis: 44 year old gentleman with stage IV adenocarcinoma of the lung diagnosed in March of 2015. He presented with left upper lobe lung mass measuring 5.4 x 4.7 x 3.7 cm and bilateral pulmonary nodules. Pathology indicating adenocarcinoma of a lung primary. 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  Prior Therapy:  Tarceva 150 mg daily started in March of 2015. Placed on hold 12/14/13 due to rash. This was resumed again on 12/24/2013. Discontinued 10/05/2014 secondary to disease progression within the chest as well as multiple brain metastasis. Whole brain irradiation under the care of Dr. Lisbeth Renshaw expected to be completed on 11/08/2014   Current therapy:   1. Gilotrif (afatinib) 40 mg by mouth daily Therapy beginning approximately 10/05/2014. Therapy interrupted on 11/23/2014. His therapy was resumed at 20 mg daily on 12/01/2014.  Interim History:  Chad George presents today for routine followup with his family. Since the last visit, he has tolerated the dose reduction without any new complications. He does report his rash is much improved also his appetite has improved as well. He has gained 5 pounds at this time. He still have poor energy and decrease in his exercise tolerance. He does report intermittent lower back pain that is not associated with any neurological symptoms. He is able to ambulate without any difficulties. He is able to move his bowels without any constipation or diarrhea. He does not report any incontinence.  He is not reporting any headaches or blurry vision does not report any alteration of mental status psychiatric issues of depression. Is not reporting any chest pain or palpitation. Does not report any nausea or vomiting abdominal pain. Has not reported any hematochezia  or melena. Has not reported any frequency urgency or hesitancy. Does not report any lymphadenopathy or petechiae. He does not report any skeletal pain.he does report paronychia affecting his toes and fingernails.  Remainder of his review of system is unremarkable.  Medications: I have reviewed the patient's current medications.  Current Outpatient Prescriptions  Medication Sig Dispense Refill  . acetaminophen (TYLENOL) 325 MG tablet Take 650 mg by mouth every 6 (six) hours as needed for mild pain.     Marland Kitchen afatinib dimaleate (GILOTRIF) 20 MG tablet Take 1 tablet (20 mg total) by mouth daily. Take on an empty stomach 1hr before or 2hrs after meals. 30 tablet 0  . albuterol (PROVENTIL HFA;VENTOLIN HFA) 108 (90 BASE) MCG/ACT inhaler Inhale 2 puffs into the lungs every 6 (six) hours as needed for wheezing or shortness of breath. 1 Inhaler 1  . benzonatate (TESSALON) 100 MG capsule Take 1 to 2 capsules by mouth every 8 hours as needed for cough 45 capsule 2  . chlorpheniramine-HYDROcodone (TUSSIONEX PENNKINETIC ER) 10-8 MG/5ML LQCR Take 5 mLs by mouth every 12 (twelve) hours as needed for cough. 1 Bottle 0  . diphenoxylate-atropine (LOMOTIL) 2.5-0.025 MG per tablet Take 2 tablets by mouth 4 (four) times daily as needed for diarrhea or loose stools. 30 tablet 1  . doxycycline (VIBRAMYCIN) 100 MG capsule Take 1 capsule (100 mg total) by mouth 2 (two) times daily. 60 capsule 1  . fluticasone (FLONASE) 50 MCG/ACT nasal spray Place 2 sprays into both nostrils daily. 16 g 6  . guaiFENesin (MUCINEX) 600 MG 12 hr tablet Take 600 mg by mouth 2 (two) times daily as needed  for cough or to loosen phlegm.     . Lactulose 20 GM/30ML SOLN Take 30 cc by mouth every 4 hours as needed for constipation. 236 mL 1  . loratadine (CLARITIN) 10 MG tablet Take 1 tablet (10 mg total) by mouth daily. (Patient taking differently: Take 10 mg by mouth daily as needed for allergies. ) 30 tablet 11  . metoCLOPramide (REGLAN) 10 MG tablet  Take 1 tablet (10 mg total) by mouth every 6 (six) hours as needed for nausea or vomiting (nausea/headache). 20 tablet 0  . ondansetron (ZOFRAN ODT) 8 MG disintegrating tablet Take 1 tablet (8 mg total) by mouth every 12 (twelve) hours as needed for nausea or vomiting. 30 tablet 0  . ondansetron (ZOFRAN) 8 MG tablet Take 1 tablet (8 mg total) by mouth every 12 (twelve) hours as needed for nausea or vomiting. 30 tablet 0  . oxyCODONE (OXY IR/ROXICODONE) 5 MG immediate release tablet Take 1-2 tablets (5-10 mg total) by mouth every 6 (six) hours as needed for severe pain. 30 tablet 0   No current facility-administered medications for this visit.     Allergies:  Allergies  Allergen Reactions  . Amoxicillin-Pot Clavulanate Other (See Comments)    UNKNOWN  . Penicillins Rash    Past Medical History, Surgical history, Social history, and Family History were reviewed and updated.    Physical Exam: Blood pressure 99/61, pulse 98, temperature 97.1 F (36.2 C), temperature source Oral, resp. rate 18, height $RemoveBe'5\' 6"'wMqCfsIEJ$  (1.676 m), weight 145 lb 14.4 oz (66.18 kg), SpO2 99 %. ECOG: 1 General appearance: alert and cooperative not in any distress. Head: Normocephalic, no masses or lesions. Neck: no adenopathy, no thyroid masses. Lymph nodes: Cervical, supraclavicular, and axillary nodes normal. Heart:regular rate and rhythm, S1, S2 normal, no murmur, click, rub or gallop Lung:bilateral basilar rales heard. No wheezing or stridor.  Abdomen: soft, non-tender, without masses or organomegaly EXT:no erythema, induration, or nodules Skin: Diffuse acneiform rash on the face chest and back have decreased in intensity.   Lab Results: Lab Results  Component Value Date   WBC 6.0 12/28/2014   HGB 12.8* 12/28/2014   HCT 37.6* 12/28/2014   MCV 86.9 12/28/2014   PLT 231 12/28/2014     Chemistry      Component Value Date/Time   NA 138 12/28/2014 1008   NA 136 11/23/2014 1844   K 3.7 12/28/2014 1008   K  3.2* 11/23/2014 1844   CL 101 11/23/2014 1844   CO2 24 12/28/2014 1008   CO2 22 11/18/2013 0457   BUN 7.4 12/28/2014 1008   BUN 22 11/23/2014 1844   CREATININE 0.7 12/28/2014 1008   CREATININE 0.90 11/23/2014 1844   CREATININE 0.69 08/16/2013 1727      Component Value Date/Time   CALCIUM 8.7 12/28/2014 1008   CALCIUM 9.1 11/18/2013 0457   ALKPHOS 98 12/28/2014 1008   ALKPHOS 124* 11/16/2013 0630   AST 18 12/28/2014 1008   AST 18 11/16/2013 0630   ALT 21 12/28/2014 1008   ALT 17 11/16/2013 0630   BILITOT 1.08 12/28/2014 1008   BILITOT 0.6 11/16/2013 0630        The patient had an exam on 10/04/2014 which was more recent than the comparison study in the report below. Comparing to the more recent study of 10/04/2014, the abnormal soft tissue and nodularity in the central left upper and left lower lobes is not substantially changed. This disease is stable to possibly only minimally progressed since 10/04/2014.  Similar to the lung disease, the necrotic mediastinal lymphadenopathy is not substantially changed since 10/04/2014.   Impression and Plan:   44 year-old gentleman with the following issues:  1. Stage IV adenocarcinoma of the lung with a left lung mass and bilateral pulmonary nodules. His tumor is EGFR mutated but the ALK mutation was not detected.    CT scan on 10/04/2014  showed progression of disease on Tarceva.. His parenchymal metastasis as well as lymph node metastasis have increased. After 6 weeks of Afatinib, he experienced a number of side effects that required treatment interruption.   He is currently on a ferritin of at 20 mg daily and have tolerated it well. CT scan on 12/28/2014 compared to 10/04/2014 showed for the most part stable disease. The plan is to continue with the same dose and schedule. He has tolerated this much better than the full dose.  2.  Acneiform rash: Seems to be improving with drug holiday. He continues to use hydrocortisone cream as  well as doxycycline.  4. Paronychia-likely related to Gilotrif therapy. This is improved at this time.  5. Constipation: This has improved since the last visit.  6. Hypokalemia: His potassium of normalizes 3.7.  7. Follow up:  In 4  Weeks.  Zola Button, MD 4/22/20161:49 PM

## 2015-01-03 ENCOUNTER — Other Ambulatory Visit: Payer: Self-pay | Admitting: *Deleted

## 2015-01-03 MED ORDER — AFATINIB DIMALEATE 20 MG PO TABS: 20.0000 mg | ORAL_TABLET | Freq: Every day | ORAL | Status: DC

## 2015-01-09 ENCOUNTER — Encounter: Payer: Self-pay | Admitting: Oncology

## 2015-01-09 ENCOUNTER — Telehealth: Payer: Self-pay | Admitting: *Deleted

## 2015-01-09 ENCOUNTER — Other Ambulatory Visit: Payer: Self-pay | Admitting: *Deleted

## 2015-01-09 DIAGNOSIS — C7931 Secondary malignant neoplasm of brain: Secondary | ICD-10-CM

## 2015-01-09 DIAGNOSIS — C801 Malignant (primary) neoplasm, unspecified: Secondary | ICD-10-CM

## 2015-01-09 DIAGNOSIS — C3491 Malignant neoplasm of unspecified part of right bronchus or lung: Secondary | ICD-10-CM

## 2015-01-09 MED ORDER — AFATINIB DIMALEATE 20 MG PO TABS: 20.0000 mg | ORAL_TABLET | Freq: Every day | ORAL | Status: DC

## 2015-01-09 NOTE — Telephone Encounter (Signed)
PT. HAS NOT RECEIVED HIS GILOTRIF. SPOKE TO RAQUEL BROWNING IN MANAGED CARE. PT.'S PRESCRIPTION FOR GILOTRIF HAS BEEN FAXED TO [facility]. THIS PHARMACY WILL CALL PT. WHEN THE MEDICATION IS FILLED AND READY TO BE DELIVERED. NOTIFIED PT. HE VOICES UNDERSTANDING.

## 2015-01-09 NOTE — Progress Notes (Signed)
I faxed new scrip to [facility].

## 2015-01-20 ENCOUNTER — Ambulatory Visit: Payer: Self-pay

## 2015-01-23 ENCOUNTER — Ambulatory Visit: Payer: Self-pay | Attending: Internal Medicine

## 2015-01-30 ENCOUNTER — Telehealth: Payer: Self-pay | Admitting: *Deleted

## 2015-01-30 NOTE — Telephone Encounter (Signed)
TC from patient requesting refill on his afatinib dimaleate (GILOTRIF) 20 MG tablet. Last filled on 01/09/15. He states that his last refill took quite a long time and he went without it for 5 days.  Currently he has 10 tablets left.

## 2015-01-31 ENCOUNTER — Other Ambulatory Visit: Payer: Self-pay | Admitting: *Deleted

## 2015-01-31 DIAGNOSIS — C3491 Malignant neoplasm of unspecified part of right bronchus or lung: Secondary | ICD-10-CM

## 2015-01-31 DIAGNOSIS — C801 Malignant (primary) neoplasm, unspecified: Secondary | ICD-10-CM

## 2015-01-31 DIAGNOSIS — C7931 Secondary malignant neoplasm of brain: Secondary | ICD-10-CM

## 2015-01-31 MED ORDER — AFATINIB DIMALEATE 20 MG PO TABS: 20.0000 mg | ORAL_TABLET | Freq: Every day | ORAL | Status: DC

## 2015-02-02 ENCOUNTER — Encounter: Payer: Self-pay | Admitting: Physician Assistant

## 2015-02-02 ENCOUNTER — Other Ambulatory Visit (HOSPITAL_BASED_OUTPATIENT_CLINIC_OR_DEPARTMENT_OTHER): Payer: Self-pay

## 2015-02-02 ENCOUNTER — Telehealth: Payer: Self-pay | Admitting: Oncology

## 2015-02-02 ENCOUNTER — Ambulatory Visit (HOSPITAL_BASED_OUTPATIENT_CLINIC_OR_DEPARTMENT_OTHER): Payer: Self-pay | Admitting: Physician Assistant

## 2015-02-02 VITALS — BP 103/60 | HR 83 | Temp 98.0°F | Resp 18 | Ht 66.0 in | Wt 143.6 lb

## 2015-02-02 DIAGNOSIS — L03031 Cellulitis of right toe: Secondary | ICD-10-CM

## 2015-02-02 DIAGNOSIS — L03032 Cellulitis of left toe: Secondary | ICD-10-CM

## 2015-02-02 DIAGNOSIS — C3490 Malignant neoplasm of unspecified part of unspecified bronchus or lung: Secondary | ICD-10-CM

## 2015-02-02 DIAGNOSIS — M545 Low back pain, unspecified: Secondary | ICD-10-CM

## 2015-02-02 DIAGNOSIS — C7931 Secondary malignant neoplasm of brain: Secondary | ICD-10-CM

## 2015-02-02 DIAGNOSIS — C7989 Secondary malignant neoplasm of other specified sites: Secondary | ICD-10-CM

## 2015-02-02 DIAGNOSIS — C3412 Malignant neoplasm of upper lobe, left bronchus or lung: Secondary | ICD-10-CM

## 2015-02-02 DIAGNOSIS — E876 Hypokalemia: Secondary | ICD-10-CM

## 2015-02-02 DIAGNOSIS — C801 Malignant (primary) neoplasm, unspecified: Secondary | ICD-10-CM

## 2015-02-02 DIAGNOSIS — K5900 Constipation, unspecified: Secondary | ICD-10-CM

## 2015-02-02 DIAGNOSIS — C3432 Malignant neoplasm of lower lobe, left bronchus or lung: Secondary | ICD-10-CM

## 2015-02-02 LAB — COMPREHENSIVE METABOLIC PANEL (CC13)
ALK PHOS: 119 U/L (ref 40–150)
ALT: 15 U/L (ref 0–55)
ANION GAP: 11 meq/L (ref 3–11)
AST: 18 U/L (ref 5–34)
Albumin: 3.6 g/dL (ref 3.5–5.0)
BUN: 10.7 mg/dL (ref 7.0–26.0)
CO2: 20 mEq/L — ABNORMAL LOW (ref 22–29)
CREATININE: 0.7 mg/dL (ref 0.7–1.3)
Calcium: 8.7 mg/dL (ref 8.4–10.4)
Chloride: 107 mEq/L (ref 98–109)
EGFR: >90 mL/min/{1.73_m2} (ref >90)
Glucose: 93 mg/dl (ref 70–140)
POTASSIUM: 3.5 meq/L (ref 3.5–5.1)
Sodium: 139 mEq/L (ref 136–145)
Total Bilirubin: 1 mg/dL (ref 0.20–1.20)
Total Protein: 6 g/dL — ABNORMAL LOW (ref 6.4–8.3)

## 2015-02-02 LAB — CBC WITH DIFFERENTIAL/PLATELET
BASO%: 0 % (ref 0.0–2.0)
BASOS ABS: 0 10*3/uL (ref 0.0–0.1)
EOS%: 2.2 % (ref 0.0–7.0)
Eosinophils Absolute: 0.1 10*3/uL (ref 0.0–0.5)
HEMATOCRIT: 38.1 % — AB (ref 38.4–49.9)
HEMOGLOBIN: 13.7 g/dL (ref 13.0–17.1)
LYMPH#: 1 10*3/uL (ref 0.9–3.3)
LYMPH%: 25.1 % (ref 14.0–49.0)
MCH: 30.6 pg (ref 27.2–33.4)
MCHC: 36 g/dL (ref 32.0–36.0)
MCV: 85.2 fL (ref 79.3–98.0)
MONO#: 0.2 10*3/uL (ref 0.1–0.9)
MONO%: 5.7 % (ref 0.0–14.0)
NEUT#: 2.7 10*3/uL (ref 1.5–6.5)
NEUT%: 67 % (ref 39.0–75.0)
PLATELETS: 217 10*3/uL (ref 140–400)
RBC: 4.47 10*6/uL (ref 4.20–5.82)
RDW: 12.8 % (ref 11.0–14.6)
WBC: 4.1 10*3/uL (ref 4.0–10.3)

## 2015-02-02 NOTE — Progress Notes (Signed)
Hematology and Oncology Follow Up Visit  Manus Weedman [id number] 1971/05/29 44 y.o. 02/02/2015 2:47 PM [name], [name], MDJegede, [name], MD   Principle Diagnosis: 44 year old gentleman with stage IV adenocarcinoma of the lung diagnosed in March of 2015. He presented with left upper lobe lung mass measuring 5.4 x 4.7 x 3.7 cm and bilateral pulmonary nodules. Pathology indicating adenocarcinoma of a lung primary. 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  Prior Therapy:  Tarceva 150 mg daily started in March of 2015. Placed on hold 12/14/13 due to rash. This was resumed again on 12/24/2013. Discontinued 10/05/2014 secondary to disease progression within the chest as well as multiple brain metastasis. Whole brain irradiation under the care of Dr. Lisbeth Renshaw expected to be completed on 11/08/2014   Current therapy:   1. Gilotrif (afatinib) 40 mg by mouth daily Therapy beginning approximately 10/05/2014. Therapy interrupted on 11/23/2014. His therapy was resumed at 20 mg daily on 12/01/2014.  Interim History:  Mr. Bail presents today for routine followup with his wife. Since the last visit, he has tolerated the dose reduction without any new complications. He does report his rash is much improved also his appetite has improved as well. He complains of nausea and excess phlegm. He reports cough productive of clear secretions. His primary complaint is that of low back pain for the past 2 months. He denied numbness and tingling or any loss of bowel or bladder control.  He still has poor energy and decrease in his exercise tolerance. He is able to ambulate without any difficulties.  He reports his stool is soft but not watery.  He is not reporting any headaches or blurry vision does not report any alteration of mental status psychiatric issues of depression. Is not reporting any chest pain or palpitation. Does not report any nausea or vomiting abdominal  pain. Has not reported any hematochezia or melena. Has not reported any frequency urgency or hesitancy. Does not report any lymphadenopathy or petechiae. He does not report any skeletal pain.he does report paronychia affecting his toes and fingernails.  Remainder of his review of system is unremarkable.  Medications: I have reviewed the patient's current medications.  Current Outpatient Prescriptions  Medication Sig Dispense Refill  . acetaminophen (TYLENOL) 325 MG tablet Take 650 mg by mouth every 6 (six) hours as needed for mild pain.     Marland Kitchen afatinib dimaleate (GILOTRIF) 20 MG tablet Take 1 tablet (20 mg total) by mouth daily before breakfast. Take on an empty stomach 1hr before or 2hrs after meals. 30 tablet 0  . albuterol (PROVENTIL HFA;VENTOLIN HFA) 108 (90 BASE) MCG/ACT inhaler Inhale 2 puffs into the lungs every 6 (six) hours as needed for wheezing or shortness of breath. 1 Inhaler 1  . benzonatate (TESSALON) 100 MG capsule Take 1 to 2 capsules by mouth every 8 hours as needed for cough 45 capsule 2  . chlorpheniramine-HYDROcodone (TUSSIONEX PENNKINETIC ER) 10-8 MG/5ML LQCR Take 5 mLs by mouth every 12 (twelve) hours as needed for cough. 1 Bottle 0  . diphenoxylate-atropine (LOMOTIL) 2.5-0.025 MG per tablet Take 2 tablets by mouth 4 (four) times daily as needed for diarrhea or loose stools. 30 tablet 1  . doxycycline (VIBRAMYCIN) 100 MG capsule Take 1 capsule (100 mg total) by mouth 2 (two) times daily. 60 capsule 1  . fluticasone (FLONASE) 50 MCG/ACT nasal spray Place 2 sprays into both nostrils daily. 16 g 6  . guaiFENesin (MUCINEX) 600 MG 12  hr tablet Take 600 mg by mouth 2 (two) times daily as needed for cough or to loosen phlegm.     . Lactulose 20 GM/30ML SOLN Take 30 cc by mouth every 4 hours as needed for constipation. 236 mL 1  . loratadine (CLARITIN) 10 MG tablet Take 1 tablet (10 mg total) by mouth daily. (Patient taking differently: Take 10 mg by mouth daily as needed for allergies.  ) 30 tablet 11  . metoCLOPramide (REGLAN) 10 MG tablet Take 1 tablet (10 mg total) by mouth every 6 (six) hours as needed for nausea or vomiting (nausea/headache). 20 tablet 0  . ondansetron (ZOFRAN ODT) 8 MG disintegrating tablet Take 1 tablet (8 mg total) by mouth every 12 (twelve) hours as needed for nausea or vomiting. 30 tablet 0  . ondansetron (ZOFRAN) 8 MG tablet Take 1 tablet (8 mg total) by mouth every 12 (twelve) hours as needed for nausea or vomiting. 30 tablet 0  . oxyCODONE (OXY IR/ROXICODONE) 5 MG immediate release tablet Take 1-2 tablets (5-10 mg total) by mouth every 6 (six) hours as needed for severe pain. 30 tablet 0   No current facility-administered medications for this visit.     Allergies:  Allergies  Allergen Reactions  . Amoxicillin-Pot Clavulanate Other (See Comments)    UNKNOWN  . Penicillins Rash    Past Medical History, Surgical history, Social history, and Family History were reviewed and updated.    Physical Exam: Blood pressure 103/60, pulse 83, temperature 98 F (36.7 C), temperature source Oral, resp. rate 18, height _0  (1.676 m), weight 143 lb 9.6 oz (65.137 kg), SpO2 100 %. ECOG: 1 General appearance: alert and cooperative not in any distress. Head: Normocephalic, no masses or lesions. Neck: no adenopathy, no thyroid masses. Lymph nodes: Cervical, supraclavicular, and axillary nodes normal. Heart:regular rate and rhythm, S1, S2 normal, no murmur, click, rub or gallop Lung:bilateral basilar rales heard. No wheezing or stridor.  Abdomen: soft, non-tender, without masses or organomegaly EXT:no erythema, induration, or nodules Skin: Diffuse acneiform rash on the face chest and back have decreased in intensity.  Back: point tender in the lumbar region of the spine  Lab Results: Lab Results  Component Value Date   WBC 4.1 02/02/2015   HGB 13.7 02/02/2015   HCT 38.1* 02/02/2015   MCV 85.2 02/02/2015   PLT 217 02/02/2015     Chemistry       Component Value Date/Time   NA 139 02/02/2015 1008   NA 136 11/23/2014 1844   K 3.5 02/02/2015 1008   K 3.2* 11/23/2014 1844   CL 101 11/23/2014 1844   CO2 20* 02/02/2015 1008   CO2 22 11/18/2013 0457   BUN 10.7 02/02/2015 1008   BUN 22 11/23/2014 1844   CREATININE 0.7 02/02/2015 1008   CREATININE 0.90 11/23/2014 1844   CREATININE 0.69 08/16/2013 1727      Component Value Date/Time   CALCIUM 8.7 02/02/2015 1008   CALCIUM 9.1 11/18/2013 0457   ALKPHOS 119 02/02/2015 1008   ALKPHOS 124* 11/16/2013 0630   AST 18 02/02/2015 1008   AST 18 11/16/2013 0630   ALT 15 02/02/2015 1008   ALT 17 11/16/2013 0630   BILITOT 1.00 02/02/2015 1008   BILITOT 0.6 11/16/2013 0630        The patient had an exam on 10/04/2014 which was more recent than the comparison study in the report below. Comparing to the more recent study of 10/04/2014, the abnormal soft tissue and nodularity in  the central left upper and left lower lobes is not substantially changed. This disease is stable to possibly only minimally progressed since 10/04/2014. Similar to the lung disease, the necrotic mediastinal lymphadenopathy is not substantially changed since 10/04/2014.   Impression and Plan:   44 year-old gentleman with the following issues:  1. Stage IV adenocarcinoma of the lung with a left lung mass and bilateral pulmonary nodules. His tumor is EGFR mutated but the ALK mutation was not detected.    CT scan on 10/04/2014  showed progression of disease on Tarceva.. His parenchymal metastasis as well as lymph node metastasis have increased. After 6 weeks of Afatinib, he experienced a number of side effects that required treatment interruption.   He is currently on Gilotrif  at 20 mg daily and have tolerated it well. CT scan on 12/28/2014 compared to 10/04/2014 showed for the most part stable disease. The plan is to continue with the same dose and schedule. He has tolerated this much better than the full  dose.  2.  Acneiform rash: Seems to be improving with drug holiday. He continues to use hydrocortisone cream as well as doxycycline.  4. Paronychia-likely related to Gilotrif therapy. This is improved at this time.  5. Constipation: This has improved since the last visit.  6. Hypokalemia: His potassium has normalized 3.5  7. Back pain: we will obtain a MRI of the lumbar and sacral spine to further evaluate his complaint of persistent back pain.  8. Follow up:  In 4  Weeks.  Carlton Adam, PA-C  5/26/20162:47 PM

## 2015-02-02 NOTE — Telephone Encounter (Signed)
Gave patient appointment schedule for June.

## 2015-02-02 NOTE — Telephone Encounter (Signed)
Add to previous note - central will call re mri appointment.

## 2015-02-03 ENCOUNTER — Other Ambulatory Visit: Payer: Self-pay | Admitting: *Deleted

## 2015-02-03 DIAGNOSIS — C7931 Secondary malignant neoplasm of brain: Secondary | ICD-10-CM

## 2015-02-03 DIAGNOSIS — C801 Malignant (primary) neoplasm, unspecified: Secondary | ICD-10-CM

## 2015-02-03 DIAGNOSIS — C3491 Malignant neoplasm of unspecified part of right bronchus or lung: Secondary | ICD-10-CM

## 2015-02-03 MED ORDER — AFATINIB DIMALEATE 20 MG PO TABS: 20.0000 mg | ORAL_TABLET | Freq: Every day | ORAL | Status: DC

## 2015-02-03 NOTE — Telephone Encounter (Signed)
Patient called reporting he has "five Gilotrif pills left.  I keep having a problem with refills and running out."  Observed order on 01-31-2015 but incorrect pharmacy received this order.  Called [facility].  Per Tiffany "patient does not have any refills on file and needs new order.  Fax Gilotrif order to [phone] with cover sheet and three patient identifiers(D.O.B., address, phone number) must be on prescription.

## 2015-02-06 NOTE — Patient Instructions (Signed)
Keep the appointment for the MRI of your spine to evaluate your back pain. Continue Gilotrif 20 mg by mouth daily Follow up in 4 weeks

## 2015-02-09 ENCOUNTER — Ambulatory Visit (HOSPITAL_COMMUNITY)
Admission: RE | Admit: 2015-02-09 | Discharge: 2015-02-09 | Disposition: A | Discharge status: Home / Self Care | Payer: Self-pay | Source: Ambulatory Visit | Attending: Physician Assistant | Admitting: Physician Assistant

## 2015-02-09 DIAGNOSIS — M545 Low back pain, unspecified: Secondary | ICD-10-CM

## 2015-02-09 DIAGNOSIS — C7951 Secondary malignant neoplasm of bone: Secondary | ICD-10-CM | POA: Insufficient documentation

## 2015-02-09 DIAGNOSIS — C3490 Malignant neoplasm of unspecified part of unspecified bronchus or lung: Secondary | ICD-10-CM | POA: Insufficient documentation

## 2015-02-09 MED ORDER — GADOBENATE DIMEGLUMINE 529 MG/ML IV SOLN
15.0000 mL | Freq: Once | INTRAVENOUS | Status: AC | PRN
  Administered 2015-02-09: 13 mL via INTRAVENOUS

## 2015-02-21 ENCOUNTER — Ambulatory Visit (HOSPITAL_COMMUNITY)
Admission: RE | Admit: 2015-02-21 | Discharge: 2015-02-21 | Disposition: A | Discharge status: Home / Self Care | Payer: Self-pay | Source: Ambulatory Visit | Attending: Radiation Oncology | Admitting: Radiation Oncology

## 2015-02-21 DIAGNOSIS — C3412 Malignant neoplasm of upper lobe, left bronchus or lung: Secondary | ICD-10-CM | POA: Insufficient documentation

## 2015-02-21 DIAGNOSIS — Z923 Personal history of irradiation: Secondary | ICD-10-CM | POA: Insufficient documentation

## 2015-02-21 DIAGNOSIS — C7931 Secondary malignant neoplasm of brain: Secondary | ICD-10-CM | POA: Insufficient documentation

## 2015-02-21 DIAGNOSIS — Z79899 Other long term (current) drug therapy: Secondary | ICD-10-CM | POA: Insufficient documentation

## 2015-02-21 MED ORDER — GADOBENATE DIMEGLUMINE 529 MG/ML IV SOLN
13.0000 mL | Freq: Once | INTRAVENOUS | Status: AC | PRN
  Administered 2015-02-21: 13 mL via INTRAVENOUS

## 2015-02-22 ENCOUNTER — Ambulatory Visit: Payer: Self-pay | Attending: Internal Medicine | Admitting: *Deleted

## 2015-02-22 DIAGNOSIS — Z23 Encounter for immunization: Secondary | ICD-10-CM | POA: Insufficient documentation

## 2015-02-22 NOTE — Progress Notes (Signed)
Patient presents for pneumonia vaccine.  Patient states feeling well  Patient received pneumococcal-23 on 11/17/13 Per protocol and provider, pneumococcal-13 administered

## 2015-02-23 ENCOUNTER — Ambulatory Visit: Admission: RE | Admit: 2015-02-23 | Payer: Self-pay | Source: Ambulatory Visit | Admitting: Radiation Oncology

## 2015-02-27 ENCOUNTER — Ambulatory Visit
Admission: RE | Admit: 2015-02-27 | Discharge: 2015-02-27 | Disposition: A | Discharge status: Home / Self Care | Payer: Self-pay | Source: Ambulatory Visit | Attending: Radiation Oncology | Admitting: Radiation Oncology

## 2015-02-27 ENCOUNTER — Encounter: Payer: Self-pay | Admitting: Radiation Oncology

## 2015-02-27 VITALS — BP 109/74 | HR 83 | Temp 98.5°F | Resp 20 | Ht 64.0 in | Wt 144.8 lb

## 2015-02-27 DIAGNOSIS — C7931 Secondary malignant neoplasm of brain: Secondary | ICD-10-CM

## 2015-02-27 NOTE — Progress Notes (Addendum)
Follow up s/p rad txs whole brain, rt shoulder, left abd/spleen 10/19/14-11/08/14, last MRI 02/21/15 results in and Ct lumbar 02/09/15, patient c/o lower lumbar pain radiates to right leg when standing, or moving  slight to the right or left side 7/10 scale when first standing up feels like stabbing pain ", appetite, good, energy level fair, no head aches, nausea,  nopain in rt shoulder stated 11:22 AM BP 109/74 mmHg  Pulse 83  Temp(Src) 98.5 F (36.9 C) (Oral)  Resp 20  Ht '5\' 4"'$  (1.626 m)  Wt 144 lb 12.8 oz (65.681 kg)  BMI 24.84 kg/m2  Wt Readings from Last 3 Encounters:  02/27/15 144 lb 12.8 oz (65.681 kg)  02/09/15 143 lb (64.864 kg)  02/02/15 143 lb 9.6 oz (65.137 kg)

## 2015-02-27 NOTE — Progress Notes (Signed)
Radiation Oncology         [phone] ________________________________  Name: Chad George MRN: [id number]  Date: 02/27/2015  DOB: 11-Apr-1971  Follow-Up Visit Note  CC: [name], MD  Wyatt Portela, MD  Diagnosis: Metastatic adenocarcinoma of the lung  Interval Since Last Radiation:  2 months   Narrative:  The patient returns today for routine follow-up.  Follow up s/p rad txs whole brain, rt shoulder, left abd/spleen 10/19/14-11/08/14, last MRI 02/21/15 results in and Ct lumbar 02/09/15, patient c/o lower lumbar pain radiates to right leg when standing, or moving  slight to the right or left side 7/10 scale when first standing up feels like stabbing pain ", appetite, good, energy level fair, no head aches, nausea,  nopain in rt shoulder stated                      ALLERGIES:  is allergic to amoxicillin-pot clavulanate and penicillins.  Meds: Current Outpatient Prescriptions  Medication Sig Dispense Refill  . acetaminophen (TYLENOL) 325 MG tablet Take 650 mg by mouth every 6 (six) hours as needed for mild pain.     Marland Kitchen afatinib dimaleate (GILOTRIF) 20 MG tablet Take 1 tablet (20 mg total) by mouth daily before breakfast. Take on an empty stomach 1hr before or 2hrs after meals. 30 tablet 0  . albuterol (PROVENTIL HFA;VENTOLIN HFA) 108 (90 BASE) MCG/ACT inhaler Inhale 2 puffs into the lungs every 6 (six) hours as needed for wheezing or shortness of breath. 1 Inhaler 1  . benzonatate (TESSALON) 100 MG capsule Take 1 to 2 capsules by mouth every 8 hours as needed for cough 45 capsule 2  . chlorpheniramine-HYDROcodone (TUSSIONEX PENNKINETIC ER) 10-8 MG/5ML LQCR Take 5 mLs by mouth every 12 (twelve) hours as needed for cough. 1 Bottle 0  . doxycycline (VIBRAMYCIN) 100 MG capsule Take 1 capsule (100 mg total) by mouth 2 (two) times daily. 60 capsule 1  . fluticasone (FLONASE) 50 MCG/ACT nasal spray Place 2 sprays into both nostrils daily. 16 g 6  . guaiFENesin (MUCINEX) 600 MG 12 hr tablet  Take 600 mg by mouth 2 (two) times daily as needed for cough or to loosen phlegm.     . loratadine (CLARITIN) 10 MG tablet Take 1 tablet (10 mg total) by mouth daily. (Patient taking differently: Take 10 mg by mouth daily as needed for allergies. ) 30 tablet 11  . oxyCODONE (OXY IR/ROXICODONE) 5 MG immediate release tablet Take 1-2 tablets (5-10 mg total) by mouth every 6 (six) hours as needed for severe pain. 30 tablet 0  . diphenoxylate-atropine (LOMOTIL) 2.5-0.025 MG per tablet Take 2 tablets by mouth 4 (four) times daily as needed for diarrhea or loose stools. (Patient not taking: Reported on 02/27/2015) 30 tablet 1  . Lactulose 20 GM/30ML SOLN Take 30 cc by mouth every 4 hours as needed for constipation. (Patient not taking: Reported on 02/27/2015) 236 mL 1  . metoCLOPramide (REGLAN) 10 MG tablet Take 1 tablet (10 mg total) by mouth every 6 (six) hours as needed for nausea or vomiting (nausea/headache). (Patient not taking: Reported on 02/27/2015) 20 tablet 0  . ondansetron (ZOFRAN ODT) 8 MG disintegrating tablet Take 1 tablet (8 mg total) by mouth every 12 (twelve) hours as needed for nausea or vomiting. (Patient not taking: Reported on 02/27/2015) 30 tablet 0  . ondansetron (ZOFRAN) 8 MG tablet Take 1 tablet (8 mg total) by mouth every 12 (twelve) hours as needed for nausea or  vomiting. (Patient not taking: Reported on 02/27/2015) 30 tablet 0   No current facility-administered medications for this encounter.    Physical Findings: The patient is in no acute distress. Patient is alert and oriented.  height is '5\' 4"'$  (1.626 m) and weight is 144 lb 12.8 oz (65.681 kg). His oral temperature is 98.5 F (36.9 C). His blood pressure is 109/74 and his pulse is 83. His respiration is 20. .     Lab Findings: Lab Results  Component Value Date   WBC 4.1 02/02/2015   HGB 13.7 02/02/2015   HCT 38.1* 02/02/2015   MCV 85.2 02/02/2015   PLT 217 02/02/2015     Radiographic Findings: Mr Jeri Cos Wo  Contrast  02/21/2015   CLINICAL DATA:  Stage IV left upper lobe lung cancer with brain metastases. Status post whole-brain radiation in 11/2014. Ongoing chemotherapy.  EXAM: MRI HEAD WITHOUT AND WITH CONTRAST  TECHNIQUE: Multiplanar, multiecho pulse sequences of the brain and surrounding structures were obtained without and with intravenous contrast.  CONTRAST:  32m MULTIHANCE GADOBENATE DIMEGLUMINE 529 MG/ML IV SOLN  COMPARISON:  10/10/2014  FINDINGS: There is no evidence of acute infarct, midline shift, or extra-axial fluid collection. Ventricles and sulci are within normal limits for age.  There has been a marked interval reduction in size of all of the brain metastases described on the prior study. Many lesions are no longer visible, and most of the ones that are still identified are punctate in size. A small amount of chronic blood products are associated with some of the lesions.  The largest residual enhancing lesion in the right cerebral hemisphere is located in the anterior right temporal lobe and measures 11 x 6 mm (series 11, image 13, previously 2.7 x 1.8 cm). The largest lesion remaining in the left cerebral hemisphere measures 1.3 x 0.9 cm in the left frontal centrum semiovale (series 11, image 32, previously 2.2 x 2.0 cm). There is minimal residual edema associated with the dominant left frontal lesion and with a left temporal metastasis. No new enhancing lesions are identified.  A 4 mm enhancing focus in the left parietal bone is unchanged and may represent a small venous lake or focus of granulation tissue, with metastasis felt less likely although again not excluded. 8 mm oval T1 and T2 hypointense, nonenhancing focus in the clivus is unchanged and likely benign, such as a bone island. Orbits are unremarkable. Minimal paranasal sinus mucosal thickening and small bilateral mastoid effusions are noted. Major intracranial vascular flow voids are preserved.  IMPRESSION: Positive response to therapy  with marked decreased size of all brain metastases as above. No new lesions.   Electronically Signed   By: ALogan Bores  On: 02/21/2015 09:37   Mr Lumbar Spine W Wo Contrast  02/09/2015   CLINICAL DATA:  Low back pain and right anterior thigh pain for 2 months. History of metastatic lung cancer.  EXAM: MRI LUMBAR SPINE WITHOUT AND WITH CONTRAST  TECHNIQUE: Multiplanar and multiecho pulse sequences of the lumbar spine were obtained without and with intravenous contrast.  CONTRAST:  137mMULTIHANCE GADOBENATE DIMEGLUMINE 529 MG/ML IV SOLN  COMPARISON:  CT scan of the abdomen and pelvis dated 12/28/2014  FINDINGS: Normal conus tip at L1.  Normal paraspinal soft tissues.  The patient has known sclerotic metastases in L1, L2, L4, and L5 as well as in the sacrum and iliac bones.  T12-L1 through L3-4: The discs are normal. There is no tumor in the spinal canal  or neural foramina. No bone destruction at these levels. The lesion at permeates L4 and a small metastasis in the right posterior inferior aspect of L2 do enhance after contrast administration. No neural impingement.  L4-5: Tumor replaces the majority of the L4 vertebral body with patchy enhancement. However, there is no visible pathologic fracture at this time and there is no protrusion of tumor or disc material into the spinal canal. There is a slight broad-based bulge of the L4-5 disc. The L4 nerves exit without impingement. There is tumor in the right pedicle at L4 immediately age adjacent to the right L4 nerve as it exits the neural foramen but the nerve does not appear impinged upon.  L5-S1: Normal disc. No tumor in the spinal canal. No facet arthritis.  There is a mixed lytic and sclerotic metastasis in the posterior aspect of the left iliac bone adjacent to the superior aspect of the left SI joint, unchanged since the prior CT scan of 12/28/2014.  IMPRESSION: Diffuse metastatic disease without pathologic fracture or focal neural impingement.  Slight  diffuse bulge of the L4-5 disc without neural impingement. Tumor in the right pedicle of L4 is immediately superior to the right L4 nerve as it exits the neural foramen but the nerve appears to exit without impingement.   Electronically Signed   By: Lorriane Shire M.D.   On: 02/09/2015 16:41    Impression:  Recent MRI scan shows disease in spine. Brain MRI shows no evidence of recurring disease.  Plan:  Follow up brain MRI. Patient is experiencing pain that could be associated with disease in the spine. He would be a good candidate for radiation to the lumbar/sacrum spine for treatment of this pain. Patient is concerned about his ability to tolerate treatment well. Discussed possible side effects from radiation treatment of the spine.   I went over treatment plan for spine. Patient signed consent form.   I will schedule a planning appointment as soon as possible.  Advised to take naproxen. Told patient to let staff know if naproxen is effective in treating pain.   Jodelle Gross, M.D., Ph.D.     This document serves as a record of services personally performed by Kyung Rudd, MD. It was created on his behalf by Derek Mound, a trained medical scribe. The creation of this record is based on the scribe's personal observations and the provider's statements to them. This document has been checked and approved by the attending provider.

## 2015-03-01 ENCOUNTER — Ambulatory Visit
Admission: RE | Admit: 2015-03-01 | Discharge: 2015-03-01 | Disposition: A | Discharge status: Home / Self Care | Payer: Self-pay | Source: Ambulatory Visit | Attending: Radiation Oncology | Admitting: Radiation Oncology

## 2015-03-01 DIAGNOSIS — C7931 Secondary malignant neoplasm of brain: Secondary | ICD-10-CM | POA: Insufficient documentation

## 2015-03-01 DIAGNOSIS — C7951 Secondary malignant neoplasm of bone: Secondary | ICD-10-CM | POA: Insufficient documentation

## 2015-03-01 DIAGNOSIS — C3491 Malignant neoplasm of unspecified part of right bronchus or lung: Secondary | ICD-10-CM | POA: Insufficient documentation

## 2015-03-02 ENCOUNTER — Other Ambulatory Visit: Payer: Self-pay | Admitting: *Deleted

## 2015-03-02 ENCOUNTER — Telehealth: Payer: Self-pay | Admitting: Oncology

## 2015-03-02 ENCOUNTER — Ambulatory Visit (HOSPITAL_BASED_OUTPATIENT_CLINIC_OR_DEPARTMENT_OTHER): Payer: Self-pay | Admitting: Oncology

## 2015-03-02 ENCOUNTER — Other Ambulatory Visit (HOSPITAL_BASED_OUTPATIENT_CLINIC_OR_DEPARTMENT_OTHER): Payer: Self-pay

## 2015-03-02 VITALS — BP 100/62 | HR 70 | Temp 98.4°F | Resp 18 | Ht 64.0 in | Wt 144.3 lb

## 2015-03-02 DIAGNOSIS — C3490 Malignant neoplasm of unspecified part of unspecified bronchus or lung: Secondary | ICD-10-CM

## 2015-03-02 DIAGNOSIS — L03039 Cellulitis of unspecified toe: Secondary | ICD-10-CM

## 2015-03-02 DIAGNOSIS — M545 Low back pain: Secondary | ICD-10-CM

## 2015-03-02 DIAGNOSIS — R21 Rash and other nonspecific skin eruption: Secondary | ICD-10-CM

## 2015-03-02 DIAGNOSIS — C3491 Malignant neoplasm of unspecified part of right bronchus or lung: Secondary | ICD-10-CM

## 2015-03-02 DIAGNOSIS — L03019 Cellulitis of unspecified finger: Secondary | ICD-10-CM

## 2015-03-02 DIAGNOSIS — C7989 Secondary malignant neoplasm of other specified sites: Secondary | ICD-10-CM

## 2015-03-02 DIAGNOSIS — C801 Malignant (primary) neoplasm, unspecified: Secondary | ICD-10-CM

## 2015-03-02 DIAGNOSIS — C7931 Secondary malignant neoplasm of brain: Secondary | ICD-10-CM

## 2015-03-02 DIAGNOSIS — C3412 Malignant neoplasm of upper lobe, left bronchus or lung: Secondary | ICD-10-CM

## 2015-03-02 LAB — CBC WITH DIFFERENTIAL/PLATELET
BASO%: 0.2 % (ref 0.0–2.0)
Basophils Absolute: 0 10*3/uL (ref 0.0–0.1)
EOS%: 5.1 % (ref 0.0–7.0)
Eosinophils Absolute: 0.2 10*3/uL (ref 0.0–0.5)
HCT: 39.6 % (ref 38.4–49.9)
HGB: 13.9 g/dL (ref 13.0–17.1)
LYMPH%: 16.3 % (ref 14.0–49.0)
MCH: 30.1 pg (ref 27.2–33.4)
MCHC: 35.1 g/dL (ref 32.0–36.0)
MCV: 85.7 fL (ref 79.3–98.0)
MONO#: 0.3 10*3/uL (ref 0.1–0.9)
MONO%: 6.5 % (ref 0.0–14.0)
NEUT#: 3.1 10*3/uL (ref 1.5–6.5)
NEUT%: 71.9 % (ref 39.0–75.0)
Platelets: 235 10*3/uL (ref 140–400)
RBC: 4.62 10*6/uL (ref 4.20–5.82)
RDW: 12.7 % (ref 11.0–14.6)
WBC: 4.3 10*3/uL (ref 4.0–10.3)
lymph#: 0.7 10*3/uL — ABNORMAL LOW (ref 0.9–3.3)

## 2015-03-02 LAB — COMPREHENSIVE METABOLIC PANEL (CC13)
ALT: 15 U/L (ref 0–55)
AST: 17 U/L (ref 5–34)
Albumin: 3.6 g/dL (ref 3.5–5.0)
Alkaline Phosphatase: 147 U/L (ref 40–150)
Anion Gap: 5 mEq/L (ref 3–11)
BUN: 10.5 mg/dL (ref 7.0–26.0)
CO2: 26 mEq/L (ref 22–29)
Calcium: 8.6 mg/dL (ref 8.4–10.4)
Chloride: 110 mEq/L — ABNORMAL HIGH (ref 98–109)
Creatinine: 0.7 mg/dL (ref 0.7–1.3)
EGFR: >90 mL/min/{1.73_m2} (ref >90)
Glucose: 98 mg/dl (ref 70–140)
Potassium: 3.7 mEq/L (ref 3.5–5.1)
Sodium: 141 mEq/L (ref 136–145)
Total Bilirubin: 0.6 mg/dL (ref 0.20–1.20)
Total Protein: 6.1 g/dL — ABNORMAL LOW (ref 6.4–8.3)

## 2015-03-02 MED ORDER — DOXYCYCLINE HYCLATE 100 MG PO CAPS: 100.0000 mg | ORAL_CAPSULE | Freq: Two times a day (BID) | ORAL | Status: AC

## 2015-03-02 MED ORDER — AFATINIB DIMALEATE 20 MG PO TABS: 20.0000 mg | ORAL_TABLET | Freq: Every day | ORAL | Status: DC

## 2015-03-02 NOTE — Telephone Encounter (Signed)
Gave patient avs report and appointments for July. [facility] radi[facility] schedulers will contact patient re ct appointment - patient aware.

## 2015-03-02 NOTE — Telephone Encounter (Signed)
Move lab from 7/29 to 7/27 due to ct will be 7/27. Patient aware and has new July schedule.

## 2015-03-02 NOTE — Progress Notes (Signed)
Hematology and Oncology Follow Up Visit  Chad George [id number] 12/07/1970 44 y.o. 03/02/2015 1:23 PM Chad George, MDJegede, Chad Clipper, MD   Principle Diagnosis: 44 year old gentleman with stage IV adenocarcinoma of the lung diagnosed in March of 2015. He presented with left upper lobe lung mass measuring 5.4 x 4.7 x 3.7 cm and bilateral pulmonary nodules. Pathology indicating adenocarcinoma of a lung primary. 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  Prior Therapy:  Tarceva 150 mg daily started in March of 2015. Placed on hold 12/14/13 due to rash. This was resumed again on 12/24/2013. Discontinued 10/05/2014 secondary to disease progression within the chest as well as multiple brain metastasis. Whole brain irradiation under the care of Dr. Lisbeth Renshaw expected to be completed on 11/08/2014   Current therapy:   1. Gilotrif (afatinib) 40 mg by mouth daily Therapy beginning approximately 10/05/2014. Therapy interrupted on 11/23/2014. His therapy was resumed at 20 mg daily on 12/01/2014.  Interim History:  Chad George presents today for routine followup by himself. Since the last visit, he is doing relatively fair. He continues to the dose reduction without any new complications. He does report his rash is much improved also his appetite has improved as well. He has gained 1 pound since the last visit. Continues to have intermittent back pain but not associated with any neurological deficits. He continues to ambulate without any difficulty. He is scheduled to start radiation therapy in the next few weeks. He does take pain medication in the form of oxycodone and it helps at times. He does not report any numbness or tingling. Does not report any loss of bowel or bladder function.  He is not reporting any headaches or blurry vision does not report any alteration of mental status psychiatric issues of depression. Is not reporting any chest pain or  palpitation. Does not report any nausea or vomiting abdominal pain. Has not reported any hematochezia or melena. Has not reported any frequency urgency or hesitancy. Does not report any lymphadenopathy or petechiae. He does not report any skeletal pain.he does report paronychia affecting his toes and fingernails.  Remainder of his review of system is unremarkable.  Medications: I have reviewed the patient's current medications.  Current Outpatient Prescriptions  Medication Sig Dispense Refill  . acetaminophen (TYLENOL) 325 MG tablet Take 650 mg by mouth every 6 (six) hours as needed for mild pain.     Marland Kitchen albuterol (PROVENTIL HFA;VENTOLIN HFA) 108 (90 BASE) MCG/ACT inhaler Inhale 2 puffs into the lungs every 6 (six) hours as needed for wheezing or shortness of breath. 1 Inhaler 1  . benzonatate (TESSALON) 100 MG capsule Take 1 to 2 capsules by mouth every 8 hours as needed for cough 45 capsule 2  . chlorpheniramine-HYDROcodone (TUSSIONEX PENNKINETIC ER) 10-8 MG/5ML LQCR Take 5 mLs by mouth every 12 (twelve) hours as needed for cough. 1 Bottle 0  . diphenoxylate-atropine (LOMOTIL) 2.5-0.025 MG per tablet Take 2 tablets by mouth 4 (four) times daily as needed for diarrhea or loose stools. 30 tablet 1  . fluticasone (FLONASE) 50 MCG/ACT nasal spray Place 2 sprays into both nostrils daily. 16 g 6  . guaiFENesin (MUCINEX) 600 MG 12 hr tablet Take 600 mg by mouth 2 (two) times daily as needed for cough or to loosen phlegm.     . Lactulose 20 GM/30ML SOLN Take 30 cc by mouth every 4 hours as needed for constipation. 236 mL 1  . loratadine (CLARITIN) 10  MG tablet Take 1 tablet (10 mg total) by mouth daily. (Patient taking differently: Take 10 mg by mouth daily as needed for allergies. ) 30 tablet 11  . metoCLOPramide (REGLAN) 10 MG tablet Take 1 tablet (10 mg total) by mouth every 6 (six) hours as needed for nausea or vomiting (nausea/headache). 20 tablet 0  . ondansetron (ZOFRAN ODT) 8 MG disintegrating tablet  Take 1 tablet (8 mg total) by mouth every 12 (twelve) hours as needed for nausea or vomiting. 30 tablet 0  . ondansetron (ZOFRAN) 8 MG tablet Take 1 tablet (8 mg total) by mouth every 12 (twelve) hours as needed for nausea or vomiting. 30 tablet 0  . oxyCODONE (OXY IR/ROXICODONE) 5 MG immediate release tablet Take 1-2 tablets (5-10 mg total) by mouth every 6 (six) hours as needed for severe pain. 30 tablet 0  . afatinib dimaleate (GILOTRIF) 20 MG tablet Take 1 tablet (20 mg total) by mouth daily before breakfast. Take on an empty stomach 1hr before or 2hrs after meals. 30 tablet 0  . doxycycline (VIBRAMYCIN) 100 MG capsule Take 1 capsule (100 mg total) by mouth 2 (two) times daily. 60 capsule 1   No current facility-administered medications for this visit.     Allergies:  Allergies  Allergen Reactions  . Amoxicillin-Pot Clavulanate Other (See Comments)    UNKNOWN  . Penicillins Rash    Past Medical History, Surgical history, Social history, and Family History were reviewed and updated.    Physical Exam: Blood pressure 100/62, pulse 70, temperature 98.4 F (36.9 C), temperature source Oral, resp. rate 18, height $RemoveBe'5\' 4"'CfvMTPmLi$  (1.626 m), weight 144 lb 4.8 oz (65.454 kg), SpO2 99 %. ECOG: 1 General appearance: alert and cooperative not in any distress. Chronically ill-appearing. Head: Normocephalic, no masses or lesions.  Neck: no adenopathy, no thyroid masses. Lymph nodes: Cervical, supraclavicular, and axillary nodes normal. Heart:regular rate and rhythm, S1, S2 normal, no murmur, click, rub or gallop Lung:bilateral basilar rales heard. No wheezing or dullness to percussion. Abdomen: soft, non-tender, without masses or organomegaly EXT:no erythema, induration, or nodules Skin: Diffuse acneiform rash on the face chest and back have. Folliculitis noted around his left eye. Back: point tender in the lumbar region of the spine. No lesions on inspection. Neurological exam: No deficits in motor,  sensory or deep tendon reflexes.  Lab Results: Lab Results  Component Value Date   WBC 4.3 03/02/2015   HGB 13.9 03/02/2015   HCT 39.6 03/02/2015   MCV 85.7 03/02/2015   PLT 235 03/02/2015     Chemistry      Component Value Date/Time   NA 141 03/02/2015 1240   NA 136 11/23/2014 1844   K 3.7 03/02/2015 1240   K 3.2* 11/23/2014 1844   CL 101 11/23/2014 1844   CO2 26 03/02/2015 1240   CO2 22 11/18/2013 0457   BUN 10.5 03/02/2015 1240   BUN 22 11/23/2014 1844   CREATININE 0.7 03/02/2015 1240   CREATININE 0.90 11/23/2014 1844   CREATININE 0.69 08/16/2013 1727      Component Value Date/Time   CALCIUM 8.6 03/02/2015 1240   CALCIUM 9.1 11/18/2013 0457   ALKPHOS 147 03/02/2015 1240   ALKPHOS 124* 11/16/2013 0630   AST 17 03/02/2015 1240   AST 18 11/16/2013 0630   ALT 15 03/02/2015 1240   ALT 17 11/16/2013 0630   BILITOT 0.60 03/02/2015 1240   BILITOT 0.6 11/16/2013 0630       EXAM: MRI LUMBAR SPINE WITHOUT AND WITH  CONTRAST  TECHNIQUE: Multiplanar and multiecho pulse sequences of the lumbar spine were obtained without and with intravenous contrast.  CONTRAST: 59mL MULTIHANCE GADOBENATE DIMEGLUMINE 529 MG/ML IV SOLN  COMPARISON: CT scan of the abdomen and pelvis dated 12/28/2014  FINDINGS: Normal conus tip at L1. Normal paraspinal soft tissues.  The patient has known sclerotic metastases in L1, L2, L4, and L5 as well as in the sacrum and iliac bones.  T12-L1 through L3-4: The discs are normal. There is no tumor in the spinal canal or neural foramina. No bone destruction at these levels. The lesion at permeates L4 and a small metastasis in the right posterior inferior aspect of L2 do enhance after contrast administration. No neural impingement.  L4-5: Tumor replaces the majority of the L4 vertebral body with patchy enhancement. However, there is no visible pathologic fracture at this time and there is no protrusion of tumor or disc material into the  spinal canal. There is a slight broad-based bulge of the L4-5 disc. The L4 nerves exit without impingement. There is tumor in the right pedicle at L4 immediately age adjacent to the right L4 nerve as it exits the neural foramen but the nerve does not appear impinged upon.  L5-S1: Normal disc. No tumor in the spinal canal. No facet arthritis.  There is a mixed lytic and sclerotic metastasis in the posterior aspect of the left iliac bone adjacent to the superior aspect of the left SI joint, unchanged since the prior CT scan of 12/28/2014.  IMPRESSION: Diffuse metastatic disease without pathologic fracture or focal neural impingement.  Slight diffuse bulge of the L4-5 disc without neural impingement. Tumor in the right pedicle of L4 is immediately superior to the right L4 nerve as it exits the neural foramen but the nerve appears to exit without impingement.   EXAM: MRI HEAD WITHOUT AND WITH CONTRAST  TECHNIQUE: Multiplanar, multiecho pulse sequences of the brain and surrounding structures were obtained without and with intravenous contrast.  CONTRAST: 3mL MULTIHANCE GADOBENATE DIMEGLUMINE 529 MG/ML IV SOLN  COMPARISON: 10/10/2014  FINDINGS: There is no evidence of acute infarct, midline shift, or extra-axial fluid collection. Ventricles and sulci are within normal limits for age.  There has been a marked interval reduction in size of all of the brain metastases described on the prior study. Many lesions are no longer visible, and most of the ones that are still identified are punctate in size. A small amount of chronic blood products are associated with some of the lesions.  The largest residual enhancing lesion in the right cerebral hemisphere is located in the anterior right temporal lobe and measures 11 x 6 mm (series 11, image 13, previously 2.7 x 1.8 cm). The largest lesion remaining in the left cerebral hemisphere measures 1.3 x 0.9 cm in the left  frontal centrum semiovale (series 11, image 32, previously 2.2 x 2.0 cm). There is minimal residual edema associated with the dominant left frontal lesion and with a left temporal metastasis. No new enhancing lesions are identified.  A 4 mm enhancing focus in the left parietal bone is unchanged and may represent a small venous lake or focus of granulation tissue, with metastasis felt less likely although again not excluded. 8 mm oval T1 and T2 hypointense, nonenhancing focus in the clivus is unchanged and likely benign, such as a bone island. Orbits are unremarkable. Minimal paranasal sinus mucosal thickening and small bilateral mastoid effusions are noted. Major intracranial vascular flow voids are preserved.  IMPRESSION: Positive response to  therapy with marked decreased size of all brain metastases as above. No new lesions.  Impression and Plan:   44 year-old gentleman with the following issues:  1. Stage IV adenocarcinoma of the lung with a left lung mass and bilateral pulmonary nodules. His tumor is EGFR mutated but the ALK mutation was not detected.    CT scan on 10/04/2014  showed progression of disease on Tarceva..His parenchymal metastasis as well as lymph node metastasis have increased. After 6 weeks of Afatinib, he experienced a number of side effects that required treatment interruption.   He is currently on Gilotrif  at 20 mg daily and have tolerated it well. CT scan on 12/28/2014 compared to 10/04/2014 showed for the most part stable disease. The plan is to continue with the same dose and schedule. I will repeat his CT scan in July 2016. We can use a different salvage therapy a few showed progression of disease. That will be in the form of chemotherapy versus a different targeted agent after [facility] testing.  2. Acneiform rash: Seems to be improving with drug holiday. He continues to use hydrocortisone cream as well as doxycycline.  3. Paronychia-likely  related to Gilotrif therapy. This is improved at this time.  4. Constipation: This has improved since the last visit.  5. Brain metastasis: He is status post radiation therapy. MRI of the brain on 02/21/2015 showed excellent response to radiation.  6. Back pain: MRI on 02/09/2015 was reviewed today and did not show any evidence of epidural compression. He does have diffuse bony metastasis which is contributing to his pain. He is under evaluation to start radiation therapy in the near future.  7. Follow up:  In 4 weeks after her CT scan.  [facility], MD 6/23/20161:23 PM

## 2015-03-03 ENCOUNTER — Other Ambulatory Visit: Payer: Self-pay | Admitting: *Deleted

## 2015-03-03 DIAGNOSIS — C3491 Malignant neoplasm of unspecified part of right bronchus or lung: Secondary | ICD-10-CM

## 2015-03-03 DIAGNOSIS — C7931 Secondary malignant neoplasm of brain: Secondary | ICD-10-CM

## 2015-03-03 DIAGNOSIS — C801 Malignant (primary) neoplasm, unspecified: Secondary | ICD-10-CM

## 2015-03-03 MED ORDER — AFATINIB DIMALEATE 20 MG PO TABS: 20.0000 mg | ORAL_TABLET | Freq: Every day | ORAL | Status: DC

## 2015-03-07 ENCOUNTER — Telehealth: Payer: Self-pay | Admitting: *Deleted

## 2015-03-07 NOTE — Telephone Encounter (Signed)
Chad George left voicemail that he only has four Gilotrif pills left and has not heard from the company.  Observed order faxed 03-03-2015 to [employer].  Called and confirmed receipt and scheduled shipment.  [name] reports will send Thursday 03-09-2015. Called patient notifying him [name] released the order and scheduled shipment.  Thanked me for helping with this matter.

## 2015-03-14 ENCOUNTER — Ambulatory Visit
Admission: RE | Admit: 2015-03-14 | Discharge: 2015-03-14 | Disposition: A | Discharge status: Home / Self Care | Payer: Self-pay | Source: Ambulatory Visit | Attending: Radiation Oncology | Admitting: Radiation Oncology

## 2015-03-15 ENCOUNTER — Ambulatory Visit
Admission: RE | Admit: 2015-03-15 | Discharge: 2015-03-15 | Disposition: A | Discharge status: Home / Self Care | Payer: Self-pay | Source: Ambulatory Visit | Attending: Radiation Oncology | Admitting: Radiation Oncology

## 2015-03-15 ENCOUNTER — Encounter: Payer: Self-pay | Admitting: Radiation Oncology

## 2015-03-15 VITALS — BP 109/76 | HR 77 | Temp 98.6°F | Resp 16 | Wt 142.5 lb

## 2015-03-15 DIAGNOSIS — C7931 Secondary malignant neoplasm of brain: Secondary | ICD-10-CM

## 2015-03-15 NOTE — Progress Notes (Addendum)
Weekly rad txs L=-spine c/o pain lower back 4/10 scale, Oxy IR '5mg'$  prn ,states not helping his pain, no nausea, no head aches, regular bowel movements,  reviiewed ways to manaage symptoms, fatigue, pain,  Skin irritation  No itching, no skin changes 8:57 AM  BP 109/76 mmHg  Pulse 77  Temp(Src) 98.6 F (37 C) (Oral)  Resp 16  Wt 142 lb 8 oz (64.638 kg)  SpO2 98%  Wt Readings from Last 3 Encounters:  03/15/15 142 lb 8 oz (64.638 kg)  03/02/15 144 lb 4.8 oz (65.454 kg)  02/27/15 144 lb 12.8 oz (65.681 kg)   Gaspar Garbe, RN II Rad/Onc

## 2015-03-15 NOTE — Progress Notes (Signed)
Department of Radiation Oncology  Phone:  [phone] Fax:        [phone]  Weekly Treatment Note    Name: Chad George Date: 03/15/2015 MRN: [id number] DOB: 1971/01/06   Current dose: 6 Gy  Current fraction:2   MEDICATIONS: Current Outpatient Prescriptions  Medication Sig Dispense Refill  . acetaminophen (TYLENOL) 325 MG tablet Take 650 mg by mouth every 6 (six) hours as needed for mild pain.     Marland Kitchen afatinib dimaleate (GILOTRIF) 20 MG tablet Take 1 tablet (20 mg total) by mouth daily before breakfast. 30 tablet 0  . albuterol (PROVENTIL HFA;VENTOLIN HFA) 108 (90 BASE) MCG/ACT inhaler Inhale 2 puffs into the lungs every 6 (six) hours as needed for wheezing or shortness of breath. 1 Inhaler 1  . benzonatate (TESSALON) 100 MG capsule Take 1 to 2 capsules by mouth every 8 hours as needed for cough 45 capsule 2  . chlorpheniramine-HYDROcodone (TUSSIONEX PENNKINETIC ER) 10-8 MG/5ML LQCR Take 5 mLs by mouth every 12 (twelve) hours as needed for cough. 1 Bottle 0  . diphenoxylate-atropine (LOMOTIL) 2.5-0.025 MG per tablet Take 2 tablets by mouth 4 (four) times daily as needed for diarrhea or loose stools. 30 tablet 1  . doxycycline (VIBRAMYCIN) 100 MG capsule Take 1 capsule (100 mg total) by mouth 2 (two) times daily. 60 capsule 1  . fluticasone (FLONASE) 50 MCG/ACT nasal spray Place 2 sprays into both nostrils daily. 16 g 6  . loratadine (CLARITIN) 10 MG tablet Take 1 tablet (10 mg total) by mouth daily. (Patient taking differently: Take 10 mg by mouth daily as needed for allergies. ) 30 tablet 11  . oxyCODONE (OXY IR/ROXICODONE) 5 MG immediate release tablet Take 1-2 tablets (5-10 mg total) by mouth every 6 (six) hours as needed for severe pain. 30 tablet 0  . guaiFENesin (MUCINEX) 600 MG 12 hr tablet Take 600 mg by mouth 2 (two) times daily as needed for cough or to loosen phlegm.     . Lactulose 20 GM/30ML SOLN Take 30 cc by mouth every 4 hours as needed for constipation. (Patient  not taking: Reported on 03/15/2015) 236 mL 1  . metoCLOPramide (REGLAN) 10 MG tablet Take 1 tablet (10 mg total) by mouth every 6 (six) hours as needed for nausea or vomiting (nausea/headache). (Patient not taking: Reported on 03/15/2015) 20 tablet 0  . ondansetron (ZOFRAN ODT) 8 MG disintegrating tablet Take 1 tablet (8 mg total) by mouth every 12 (twelve) hours as needed for nausea or vomiting. (Patient not taking: Reported on 03/15/2015) 30 tablet 0  . ondansetron (ZOFRAN) 8 MG tablet Take 1 tablet (8 mg total) by mouth every 12 (twelve) hours as needed for nausea or vomiting. (Patient not taking: Reported on 03/15/2015) 30 tablet 0   No current facility-administered medications for this encounter.     ALLERGIES: Amoxicillin-pot clavulanate and Penicillins   LABORATORY DATA:  Lab Results  Component Value Date   WBC 4.3 03/02/2015   HGB 13.9 03/02/2015   HCT 39.6 03/02/2015   MCV 85.7 03/02/2015   PLT 235 03/02/2015   Lab Results  Component Value Date   NA 141 03/02/2015   K 3.7 03/02/2015   CL 101 11/23/2014   CO2 26 03/02/2015   Lab Results  Component Value Date   ALT 15 03/02/2015   AST 17 03/02/2015   ALKPHOS 147 03/02/2015   BILITOT 0.60 03/02/2015     NARRATIVE: Chad George was seen today for weekly treatment management. The chart was  checked and the patient's films were reviewed.  Weekly rad txs L=-spine c/o pain lower back 4/10 scale, Oxy IR '5mg'$  prn ,states not helping his pain, no nausea, no head aches, regular bowel movements,  reviiewed ways to [facility] symptoms, fatigue, pain,  Skin irritation  No itching, no skin changes 12:13 PM  BP 109/76 mmHg  Pulse 77  Temp(Src) 98.6 F (37 C) (Oral)  Resp 16  Wt 142 lb 8 oz (64.638 kg)  SpO2 98%  Wt Readings from Last 3 Encounters:  03/15/15 142 lb 8 oz (64.638 kg)  03/02/15 144 lb 4.8 oz (65.454 kg)  02/27/15 144 lb 12.8 oz (65.681 kg)   Gaspar Garbe, RN II Rad/Onc   PHYSICAL EXAMINATION: weight is 142 lb 8 oz  (64.638 kg). His oral temperature is 98.6 F (37 C). His blood pressure is 109/76 and his pulse is 77. His respiration is 16 and oxygen saturation is 98%.        ASSESSMENT: The patient is doing satisfactorily with treatment.  PLAN: We will continue with the patient's radiation treatment as planned.

## 2015-03-16 ENCOUNTER — Ambulatory Visit
Admission: RE | Admit: 2015-03-16 | Discharge: 2015-03-16 | Disposition: A | Discharge status: Home / Self Care | Payer: Self-pay | Source: Ambulatory Visit | Attending: Radiation Oncology | Admitting: Radiation Oncology

## 2015-03-17 ENCOUNTER — Ambulatory Visit
Admission: RE | Admit: 2015-03-17 | Discharge: 2015-03-17 | Disposition: A | Discharge status: Home / Self Care | Payer: Self-pay | Source: Ambulatory Visit | Attending: Radiation Oncology | Admitting: Radiation Oncology

## 2015-03-20 ENCOUNTER — Telehealth: Payer: Self-pay | Admitting: Physician Assistant

## 2015-03-20 ENCOUNTER — Ambulatory Visit (HOSPITAL_BASED_OUTPATIENT_CLINIC_OR_DEPARTMENT_OTHER): Payer: Self-pay | Admitting: Physician Assistant

## 2015-03-20 ENCOUNTER — Ambulatory Visit
Admission: RE | Admit: 2015-03-20 | Discharge: 2015-03-20 | Disposition: A | Discharge status: Home / Self Care | Payer: Self-pay | Source: Ambulatory Visit | Attending: Radiation Oncology | Admitting: Radiation Oncology

## 2015-03-20 ENCOUNTER — Telehealth: Payer: Self-pay | Admitting: *Deleted

## 2015-03-20 ENCOUNTER — Encounter: Payer: Self-pay | Admitting: Physician Assistant

## 2015-03-20 VITALS — BP 111/60 | HR 89 | Temp 97.7°F | Resp 18 | Ht 64.0 in | Wt 140.4 lb

## 2015-03-20 DIAGNOSIS — M545 Low back pain: Secondary | ICD-10-CM

## 2015-03-20 DIAGNOSIS — L509 Urticaria, unspecified: Secondary | ICD-10-CM

## 2015-03-20 DIAGNOSIS — C7931 Secondary malignant neoplasm of brain: Secondary | ICD-10-CM

## 2015-03-20 DIAGNOSIS — L500 Allergic urticaria: Secondary | ICD-10-CM

## 2015-03-20 DIAGNOSIS — C3490 Malignant neoplasm of unspecified part of unspecified bronchus or lung: Secondary | ICD-10-CM

## 2015-03-20 DIAGNOSIS — R21 Rash and other nonspecific skin eruption: Secondary | ICD-10-CM

## 2015-03-20 DIAGNOSIS — K5900 Constipation, unspecified: Secondary | ICD-10-CM

## 2015-03-20 DIAGNOSIS — C801 Malignant (primary) neoplasm, unspecified: Secondary | ICD-10-CM

## 2015-03-20 DIAGNOSIS — L03039 Cellulitis of unspecified toe: Secondary | ICD-10-CM

## 2015-03-20 DIAGNOSIS — C7989 Secondary malignant neoplasm of other specified sites: Secondary | ICD-10-CM

## 2015-03-20 DIAGNOSIS — C3412 Malignant neoplasm of upper lobe, left bronchus or lung: Secondary | ICD-10-CM

## 2015-03-20 DIAGNOSIS — L03019 Cellulitis of unspecified finger: Secondary | ICD-10-CM

## 2015-03-20 MED ORDER — METHYLPREDNISOLONE 4 MG PO TBPK: ORAL_TABLET | ORAL | Status: DC

## 2015-03-20 MED ORDER — HYDROXYZINE HCL 10 MG PO TABS: 10.0000 mg | ORAL_TABLET | Freq: Three times a day (TID) | ORAL | Status: AC | PRN

## 2015-03-20 NOTE — Patient Instructions (Signed)
Do not take your Gilotrif or doxycycline until instructed Take the Medrol dose pak and Atarax as prescribed Follow up in one week

## 2015-03-20 NOTE — Progress Notes (Signed)
Hematology and Oncology Follow Up Visit  Chad George [id number] 07/01/1971 44 y.o. 03/20/2015 1:38 PM [name], [name], MDJegede, [name], MD   Principle Diagnosis: 44 year old gentleman with stage IV adenocarcinoma of the lung diagnosed in March of 2015. He presented with left upper lobe lung mass measuring 5.4 x 4.7 x 3.7 cm and bilateral pulmonary nodules. Pathology indicating adenocarcinoma of a lung primary. 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  Prior Therapy:  Tarceva 150 mg daily started in March of 2015. Placed on hold 12/14/13 due to rash. This was resumed again on 12/24/2013. Discontinued 10/05/2014 secondary to disease progression within the chest as well as multiple brain metastasis. Whole brain irradiation under the care of Dr. Lisbeth Renshaw expected to be completed on 11/08/2014   Current therapy:   1. Gilotrif (afatinib) 40 mg by mouth daily Therapy beginning approximately 10/05/2014. Therapy interrupted on 11/23/2014. His therapy was resumed at 20 mg daily on 12/01/2014.  Interim History:  Mr. Braver presents today for a work in visit accompanied by a friend. He reports waking up itching terribly and broken out in hives. He is been on Gilotrif 20 mg by mouth daily since March 2016. He also has been on doxycycline daily for the past year to year and a half. He states he's not had any changes to his routine, no new soaps, lotions or detergents. He also has not started any new medications recently. He denies any respiratory symptoms. He did try Benadryl for the itching but  that it was not effective. He is currently undergoing radiation therapy under the care of Dr. Lisbeth Renshaw for his low back pain. He is receiving radiation to the L-spine and will complete his course of radiation 03/27/2015. Continues to have intermittent back pain but not associated with any neurological deficits. He continues to ambulate without any difficulty.  He does  take pain medication in the form of oxycodone and it helps at times. He does not report any numbness or tingling. Does not report any loss of bowel or bladder function.  He continues to have intermittent cough. It remains at baseline.   He is not reporting any headaches or blurry vision does not report any alteration of mental status psychiatric issues of depression. Is not reporting any chest pain or palpitation. Does not report any nausea or vomiting abdominal pain. Has not reported any hematochezia or melena. Has not reported any frequency urgency or hesitancy. Does not report any lymphadenopathy or petechiae. He does not report any skeletal pain.he does report paronychia affecting his toes and fingernails.  Remainder of his review of system is unremarkable.  Medications: I have reviewed the patient's current medications.  Current Outpatient Prescriptions  Medication Sig Dispense Refill  . acetaminophen (TYLENOL) 325 MG tablet Take 650 mg by mouth every 6 (six) hours as needed for mild pain.     Marland Kitchen afatinib dimaleate (GILOTRIF) 20 MG tablet Take 1 tablet (20 mg total) by mouth daily before breakfast. 30 tablet 0  . albuterol (PROVENTIL HFA;VENTOLIN HFA) 108 (90 BASE) MCG/ACT inhaler Inhale 2 puffs into the lungs every 6 (six) hours as needed for wheezing or shortness of breath. 1 Inhaler 1  . benzonatate (TESSALON) 100 MG capsule Take 1 to 2 capsules by mouth every 8 hours as needed for cough 45 capsule 2  . chlorpheniramine-HYDROcodone (TUSSIONEX PENNKINETIC ER) 10-8 MG/5ML LQCR Take 5 mLs by mouth every 12 (twelve) hours as needed for cough. 1 Bottle 0  .  diphenoxylate-atropine (LOMOTIL) 2.5-0.025 MG per tablet Take 2 tablets by mouth 4 (four) times daily as needed for diarrhea or loose stools. 30 tablet 1  . doxycycline (VIBRAMYCIN) 100 MG capsule Take 1 capsule (100 mg total) by mouth 2 (two) times daily. 60 capsule 1  . fluticasone (FLONASE) 50 MCG/ACT nasal spray Place 2 sprays into both  nostrils daily. 16 g 6  . guaiFENesin (MUCINEX) 600 MG 12 hr tablet Take 600 mg by mouth 2 (two) times daily as needed for cough or to loosen phlegm.     . Lactulose 20 GM/30ML SOLN Take 30 cc by mouth every 4 hours as needed for constipation. 236 mL 1  . loratadine (CLARITIN) 10 MG tablet Take 1 tablet (10 mg total) by mouth daily. (Patient taking differently: Take 10 mg by mouth daily as needed for allergies. ) 30 tablet 11  . metoCLOPramide (REGLAN) 10 MG tablet Take 1 tablet (10 mg total) by mouth every 6 (six) hours as needed for nausea or vomiting (nausea/headache). 20 tablet 0  . ondansetron (ZOFRAN ODT) 8 MG disintegrating tablet Take 1 tablet (8 mg total) by mouth every 12 (twelve) hours as needed for nausea or vomiting. 30 tablet 0  . ondansetron (ZOFRAN) 8 MG tablet Take 1 tablet (8 mg total) by mouth every 12 (twelve) hours as needed for nausea or vomiting. 30 tablet 0  . oxyCODONE (OXY IR/ROXICODONE) 5 MG immediate release tablet Take 1-2 tablets (5-10 mg total) by mouth every 6 (six) hours as needed for severe pain. 30 tablet 0  . hydrOXYzine (ATARAX/VISTARIL) 10 MG tablet Take 1 tablet (10 mg total) by mouth 3 (three) times daily as needed for itching. 30 tablet 0  . methylPREDNISolone (MEDROL DOSEPAK) 4 MG TBPK tablet Take as directed with food 21 tablet 0   No current facility-administered medications for this visit.     Allergies:  Allergies  Allergen Reactions  . Amoxicillin-Pot Clavulanate Other (See Comments)    UNKNOWN  . Penicillins Rash    Past Medical History, Surgical history, Social history, and Family History were reviewed and updated.    Physical Exam: Blood pressure 111/60, pulse 89, temperature 97.7 F (36.5 C), temperature source Oral, resp. rate 18, height _0  (1.626 m), weight 140 lb 6.4 oz (63.685 kg), SpO2 99 %. ECOG: 1 General appearance: alert and cooperative not in any distress. Chronically ill-appearing. Head: Normocephalic, no masses or  lesions.  Neck: no adenopathy, no thyroid masses. Lymph nodes: Cervical, supraclavicular, and axillary nodes normal. Heart:regular rate and rhythm, S1, S2 normal, no murmur, click, rub or gallop Lung:bilateral basilar rales heard. No wheezing or dullness to percussion. Abdomen: soft, non-tender, without masses or organomegaly EXT:no erythema, induration, or nodules Skin: Diffuse acneiform rash on the face chest and back have. Folliculitis noted around his left eye. Back: point tender in the lumbar region of the spine. No lesions on inspection. Neurological exam: No deficits in motor, sensory or deep tendon reflexes. Skin: Urticarial eruptions scattered over the neck back chest up her lower extremities. Large patches of urticarial eruptions on lower back and lower abdomen  Lab Results: Lab Results  Component Value Date   WBC 4.3 03/02/2015   HGB 13.9 03/02/2015   HCT 39.6 03/02/2015   MCV 85.7 03/02/2015   PLT 235 03/02/2015     Chemistry      Component Value Date/Time   NA 141 03/02/2015 1240   NA 136 11/23/2014 1844   K 3.7 03/02/2015 1240   K  3.2* 11/23/2014 1844   CL 101 11/23/2014 1844   CO2 26 03/02/2015 1240   CO2 22 11/18/2013 0457   BUN 10.5 03/02/2015 1240   BUN 22 11/23/2014 1844   CREATININE 0.7 03/02/2015 1240   CREATININE 0.90 11/23/2014 1844   CREATININE 0.69 08/16/2013 1727      Component Value Date/Time   CALCIUM 8.6 03/02/2015 1240   CALCIUM 9.1 11/18/2013 0457   ALKPHOS 147 03/02/2015 1240   ALKPHOS 124* 11/16/2013 0630   AST 17 03/02/2015 1240   AST 18 11/16/2013 0630   ALT 15 03/02/2015 1240   ALT 17 11/16/2013 0630   BILITOT 0.60 03/02/2015 1240   BILITOT 0.6 11/16/2013 0630       EXAM: MRI LUMBAR SPINE WITHOUT AND WITH CONTRAST  TECHNIQUE: Multiplanar and multiecho pulse sequences of the lumbar spine were obtained without and with intravenous contrast.  CONTRAST: 65m MULTIHANCE GADOBENATE DIMEGLUMINE 529 MG/ML IV  SOLN  COMPARISON: CT scan of the abdomen and pelvis dated 12/28/2014  FINDINGS: Normal conus tip at L1. Normal paraspinal soft tissues.  The patient has known sclerotic metastases in L1, L2, L4, and L5 as well as in the sacrum and iliac bones.  T12-L1 through L3-4: The discs are normal. There is no tumor in the spinal canal or neural foramina. No bone destruction at these levels. The lesion at permeates L4 and a small metastasis in the right posterior inferior aspect of L2 do enhance after contrast administration. No neural impingement.  L4-5: Tumor replaces the majority of the L4 vertebral body with patchy enhancement. However, there is no visible pathologic fracture at this time and there is no protrusion of tumor or disc material into the spinal canal. There is a slight broad-based bulge of the L4-5 disc. The L4 nerves exit without impingement. There is tumor in the right pedicle at L4 immediately age adjacent to the right L4 nerve as it exits the neural foramen but the nerve does not appear impinged upon.  L5-S1: Normal disc. No tumor in the spinal canal. No facet arthritis.  There is a mixed lytic and sclerotic metastasis in the posterior aspect of the left iliac bone adjacent to the superior aspect of the left SI joint, unchanged since the prior CT scan of 12/28/2014.  IMPRESSION: Diffuse metastatic disease without pathologic fracture or focal neural impingement.  Slight diffuse bulge of the L4-5 disc without neural impingement. Tumor in the right pedicle of L4 is immediately superior to the right L4 nerve as it exits the neural foramen but the nerve appears to exit without impingement.   EXAM: MRI HEAD WITHOUT AND WITH CONTRAST  TECHNIQUE: Multiplanar, multiecho pulse sequences of the brain and surrounding structures were obtained without and with intravenous contrast.  CONTRAST: 117mMULTIHANCE GADOBENATE DIMEGLUMINE 529 MG/ML IV  SOLN  COMPARISON: 10/10/2014  FINDINGS: There is no evidence of acute infarct, midline shift, or extra-axial fluid collection. Ventricles and sulci are within normal limits for age.  There has been a marked interval reduction in size of all of the brain metastases described on the prior study. Many lesions are no longer visible, and most of the ones that are still identified are punctate in size. A small amount of chronic blood products are associated with some of the lesions.  The largest residual enhancing lesion in the right cerebral hemisphere is located in the anterior right temporal lobe and measures 11 x 6 mm (series 11, image 13, previously 2.7 x 1.8 cm). The largest lesion remaining  in the left cerebral hemisphere measures 1.3 x 0.9 cm in the left frontal centrum semiovale (series 11, image 32, previously 2.2 x 2.0 cm). There is minimal residual edema associated with the dominant left frontal lesion and with a left temporal metastasis. No new enhancing lesions are identified.  A 4 mm enhancing focus in the left parietal bone is unchanged and may represent a small venous lake or focus of granulation tissue, with metastasis felt less likely although again not excluded. 8 mm oval T1 and T2 hypointense, nonenhancing focus in the clivus is unchanged and likely benign, such as a bone island. Orbits are unremarkable. Minimal paranasal sinus mucosal thickening and small bilateral mastoid effusions are noted. Major intracranial vascular flow voids are preserved.  IMPRESSION: Positive response to therapy with marked decreased size of all brain metastases as above. No new lesions.  Impression and Plan:   44 year-old gentleman with the following issues:  1. Stage IV adenocarcinoma of the lung with a left lung mass and bilateral pulmonary nodules. His tumor is EGFR mutated but the ALK mutation was not detected.    CT scan on 10/04/2014  showed progression of disease on  Tarceva..His parenchymal metastasis as well as lymph node metastasis have increased. After 6 weeks of Afatinib, he experienced a number of side effects that required treatment interruption.   He is currently on Gilotrif  at 20 mg daily and have tolerated it well. CT scan on 12/28/2014 compared to 10/04/2014 showed for the most part stable disease. The plan is to continue with the same dose and schedule. I will repeat his CT scan in July 2016. We can use a different salvage therapy a few showed progression of disease. That will be in the form of chemotherapy versus a different targeted agent after [facility] Medicine testing.  2. Urticarial eruptions: Etiology unclear however, it could either be secondary to Gilotrif or the doxycycline. He denies any new medications, soaps or detergents. Denied any specific environmental exposures that could be the cause. I have asked him to hold both the July Trisha in the doxycycline. He'll be placed on a Medrol Dosepak as well as prescription for Atarax to address the severe itching. These prescriptions were sent to his pharmacy of record via E scribe.  2. Acneiform rash: Seems to be improving with drug holiday. He continues to use hydrocortisone cream as well as doxycycline. The acneform rash is not an active issue at this time  3. Paronychia-likely related to Gilotrif therapy. This is improved at this time.  4. Constipation: This has improved since the last visit.  5. Brain metastasis: He is status post radiation therapy. MRI of the brain on 02/21/2015 showed excellent response to radiation.  6. Back pain: MRI on 02/09/2015 was reviewed today and did not show any evidence of epidural compression. He does have diffuse bony metastasis which is contributing to his pain. He is under evaluation to start radiation therapy in the near future.  7. Follow up: One week to reevaluate his urticarial rash and to potentially reinitiate therapy with Gilotrif.  In 2 weeks after  his CT scan.  Patient reviewed with Dr. Julien Nordmann and Dr. Hazeline Junker absence.  Carlton Adam, PA-C  7/11/20161:38 PM

## 2015-03-20 NOTE — Telephone Encounter (Signed)
Pt confirmed labs/ov per 07/11 POF, gave pt AVS and Calendar.... KJ °

## 2015-03-20 NOTE — Telephone Encounter (Signed)
Saw patient after treatment  L-spine 5/10 completed, saw huge hives on b/l arms and rash , patient stated broke iout even more this morning after taking gli trif oral  Medication, will send  Patient up to see Selena Lesser, NP  For symptom management he is also a patient of Dr. Lisbeth Renshaw and Dr. Alen Blew, only c/o increased itching 8:42 AM

## 2015-03-21 ENCOUNTER — Ambulatory Visit
Admission: RE | Admit: 2015-03-21 | Discharge: 2015-03-21 | Disposition: A | Discharge status: Home / Self Care | Payer: Self-pay | Source: Ambulatory Visit | Attending: Radiation Oncology | Admitting: Radiation Oncology

## 2015-03-22 ENCOUNTER — Ambulatory Visit
Admission: RE | Admit: 2015-03-22 | Discharge: 2015-03-22 | Disposition: A | Discharge status: Home / Self Care | Payer: Self-pay | Source: Ambulatory Visit | Attending: Radiation Oncology | Admitting: Radiation Oncology

## 2015-03-22 ENCOUNTER — Encounter: Payer: Self-pay | Admitting: Radiation Oncology

## 2015-03-22 VITALS — BP 100/64 | HR 78 | Temp 98.0°F | Resp 20 | Wt 141.7 lb

## 2015-03-22 DIAGNOSIS — C7931 Secondary malignant neoplasm of brain: Secondary | ICD-10-CM

## 2015-03-22 DIAGNOSIS — C7951 Secondary malignant neoplasm of bone: Secondary | ICD-10-CM

## 2015-03-22 NOTE — Progress Notes (Signed)
Department of Radiation Oncology  Phone:  [phone] Fax:        [phone]  Weekly Treatment Note    Name: Chad George Date: 03/22/2015 MRN: [id number] DOB: November 22, 1970   Current dose: 21 Gy  Current fraction: 7   MEDICATIONS: Current Outpatient Prescriptions  Medication Sig Dispense Refill  . acetaminophen (TYLENOL) 325 MG tablet Take 650 mg by mouth every 6 (six) hours as needed for mild pain.     Marland Kitchen afatinib dimaleate (GILOTRIF) 20 MG tablet Take 1 tablet (20 mg total) by mouth daily before breakfast. 30 tablet 0  . albuterol (PROVENTIL HFA;VENTOLIN HFA) 108 (90 BASE) MCG/ACT inhaler Inhale 2 puffs into the lungs every 6 (six) hours as needed for wheezing or shortness of breath. 1 Inhaler 1  . benzonatate (TESSALON) 100 MG capsule Take 1 to 2 capsules by mouth every 8 hours as needed for cough 45 capsule 2  . chlorpheniramine-HYDROcodone (TUSSIONEX PENNKINETIC ER) 10-8 MG/5ML LQCR Take 5 mLs by mouth every 12 (twelve) hours as needed for cough. 1 Bottle 0  . diphenoxylate-atropine (LOMOTIL) 2.5-0.025 MG per tablet Take 2 tablets by mouth 4 (four) times daily as needed for diarrhea or loose stools. 30 tablet 1  . doxycycline (VIBRAMYCIN) 100 MG capsule Take 1 capsule (100 mg total) by mouth 2 (two) times daily. 60 capsule 1  . fluticasone (FLONASE) 50 MCG/ACT nasal spray Place 2 sprays into both nostrils daily. 16 g 6  . guaiFENesin (MUCINEX) 600 MG 12 hr tablet Take 600 mg by mouth 2 (two) times daily as needed for cough or to loosen phlegm.     . hydrOXYzine (ATARAX/VISTARIL) 10 MG tablet Take 1 tablet (10 mg total) by mouth 3 (three) times daily as needed for itching. 30 tablet 0  . Lactulose 20 GM/30ML SOLN Take 30 cc by mouth every 4 hours as needed for constipation. 236 mL 1  . loratadine (CLARITIN) 10 MG tablet Take 1 tablet (10 mg total) by mouth daily. (Patient taking differently: Take 10 mg by mouth daily as needed for allergies. ) 30 tablet 11  . methylPREDNISolone  (MEDROL DOSEPAK) 4 MG TBPK tablet Take as directed with food 21 tablet 0  . metoCLOPramide (REGLAN) 10 MG tablet Take 1 tablet (10 mg total) by mouth every 6 (six) hours as needed for nausea or vomiting (nausea/headache). 20 tablet 0  . ondansetron (ZOFRAN ODT) 8 MG disintegrating tablet Take 1 tablet (8 mg total) by mouth every 12 (twelve) hours as needed for nausea or vomiting. 30 tablet 0  . ondansetron (ZOFRAN) 8 MG tablet Take 1 tablet (8 mg total) by mouth every 12 (twelve) hours as needed for nausea or vomiting. 30 tablet 0  . oxyCODONE (OXY IR/ROXICODONE) 5 MG immediate release tablet Take 1-2 tablets (5-10 mg total) by mouth every 6 (six) hours as needed for severe pain. 30 tablet 0   No current facility-administered medications for this encounter.     ALLERGIES: Amoxicillin-pot clavulanate and Penicillins   LABORATORY DATA:  Lab Results  Component Value Date   WBC 4.3 03/02/2015   HGB 13.9 03/02/2015   HCT 39.6 03/02/2015   MCV 85.7 03/02/2015   PLT 235 03/02/2015   Lab Results  Component Value Date   NA 141 03/02/2015   K 3.7 03/02/2015   CL 101 11/23/2014   CO2 26 03/02/2015   Lab Results  Component Value Date   ALT 15 03/02/2015   AST 17 03/02/2015   ALKPHOS 147 03/02/2015  BILITOT 0.60 03/02/2015     NARRATIVE: Geordan Xu was seen today for weekly treatment management. The chart was checked and the patient's films were reviewed.  Weekly rad txs L-spine, pain 2/3 on 1-10 scale,Mild pain stated, started  Today prednisone pack for hives  , no nausea, appetite good, no c/o headaches, energy level fair, takes naps 8:50 AM BP 100/64 mmHg  Pulse 78  Temp(Src) 98 F (36.7 C) (Oral)  Resp 20  Wt 141 lb 11.2 oz (64.275 kg)  Wt Readings from Last 3 Encounters:  03/22/15 141 lb 11.2 oz (64.275 kg)  03/20/15 140 lb 6.4 oz (63.685 kg)  03/15/15 142 lb 8 oz (64.638 kg)    PHYSICAL EXAMINATION: weight is 141 lb 11.2 oz (64.275 kg). His oral temperature is 98 F  (36.7 C). His blood pressure is 100/64 and his pulse is 78. His respiration is 20.        ASSESSMENT: The patient is doing satisfactorily with treatment.  PLAN: We will continue with the patient's radiation treatment as planned.

## 2015-03-22 NOTE — Progress Notes (Signed)
Weekly rad txs L-spine, pain 2/3 on 1-10 scale,Mild pain stated, started  Today prednisone pack for hives  , no nausea, appetite good, no c/o headaches, energy level fair, takes naps 8:41 AM BP 100/64 mmHg  Pulse 78  Temp(Src) 98 F (36.7 C) (Oral)  Resp 20  Wt 141 lb 11.2 oz (64.275 kg)  Wt Readings from Last 3 Encounters:  03/22/15 141 lb 11.2 oz (64.275 kg)  03/20/15 140 lb 6.4 oz (63.685 kg)  03/15/15 142 lb 8 oz (64.638 kg)

## 2015-03-22 NOTE — Progress Notes (Signed)
  Radiation Oncology         [phone] ________________________________  Name: Chad George MRN: [id number]  Date: 03/01/2015  DOB: Jun 18, 1971  SIMULATION AND TREATMENT PLANNING NOTE  DIAGNOSIS:  Metastatic lung cancer  Site:  Lumbar spine  NARRATIVE:  The patient was brought to the [facility].  Identity was confirmed.  All relevant records and images related to the planned course of therapy were reviewed.   Written consent to proceed with treatment was confirmed which was freely given after reviewing the details related to the planned course of therapy had been reviewed with the patient.  Then, the patient was set-up in a stable reproducible  supine position for radiation therapy.  CT images were obtained.  Surface markings were placed.    Medically necessary complex treatment device(s) for immobilization:  Customized vac lock bag.   The CT images were loaded into the planning software.  Then the target and avoidance structures were contoured.  Treatment planning then occurred.  The radiation prescription was entered and confirmed.  The patient will undergo a 3-D conformal treatment on our tomotherapy unit. The number of treatment devices will be determined by the final sinogram on the tomotherapy segmentation plan. I have requested : 3D Simulation  I have requested a DVH of the following structures: Target volume, bowel, left kidney, right kidney.   The patient will undergo daily image guidance to ensure accurate localization of the target, and adequate minimize dose to the normal surrounding structures in close proximity to the target.  PLAN:  The patient will receive 30 Gy in 10 fractions.  ________________________________   Jodelle Gross, MD, PhD

## 2015-03-23 ENCOUNTER — Ambulatory Visit: Payer: Self-pay | Attending: Internal Medicine

## 2015-03-23 ENCOUNTER — Ambulatory Visit
Admission: RE | Admit: 2015-03-23 | Discharge: 2015-03-23 | Disposition: A | Discharge status: Home / Self Care | Payer: Self-pay | Source: Ambulatory Visit | Attending: Radiation Oncology | Admitting: Radiation Oncology

## 2015-03-24 ENCOUNTER — Ambulatory Visit
Admission: RE | Admit: 2015-03-24 | Discharge: 2015-03-24 | Disposition: A | Discharge status: Home / Self Care | Payer: Self-pay | Source: Ambulatory Visit | Attending: Radiation Oncology | Admitting: Radiation Oncology

## 2015-03-27 ENCOUNTER — Ambulatory Visit
Admission: RE | Admit: 2015-03-27 | Discharge: 2015-03-27 | Disposition: A | Discharge status: Home / Self Care | Payer: Self-pay | Source: Ambulatory Visit | Attending: Radiation Oncology | Admitting: Radiation Oncology

## 2015-03-27 ENCOUNTER — Encounter: Payer: Self-pay | Admitting: Radiation Oncology

## 2015-03-27 VITALS — BP 100/61 | HR 72 | Temp 98.6°F | Resp 16 | Ht 64.0 in | Wt 141.6 lb

## 2015-03-27 DIAGNOSIS — C7951 Secondary malignant neoplasm of bone: Secondary | ICD-10-CM

## 2015-03-27 DIAGNOSIS — Z923 Personal history of irradiation: Secondary | ICD-10-CM

## 2015-03-27 HISTORY — DX: Personal history of irradiation: Z92.3

## 2015-03-27 NOTE — Progress Notes (Signed)
Chad George has completed treatment with 10 fractions to his L spine.  He reports new pain that started Saturday in his right ribs/side.  He describes the pain as sharp and is rating it at an 8/10 and is worse with coughing.  He took 1 oxycodone 5 mg Saturday night to help him sleep.  He is coughing frequently with green sputum.  He is taking tussionex and tessalon. He reports the pain in his lower back is better although he says it is burning a little. He denies nausea, diarrhea.  He reports his appetite is good.  He does have a scattered raised rash all over this arms, back, chest and legs that he says is from [place].  He has stopped taking it and has been on a medrol dose pak.  Today is his last day of the dose pak.  He reports the rash is getting bigger.  He reports fatigue.  He has been given a one month follow up appointment card.  BP 100/61 mmHg  Pulse 72  Temp(Src) 98.6 F (37 C) (Oral)  Resp 16  Ht '5\' 4"'$  (1.626 m)  Wt 141 lb 9.6 oz (64.229 kg)  BMI 24.29 kg/m2  SpO2 98%

## 2015-03-27 NOTE — Progress Notes (Signed)
   Weekly Management Note  Outpatient    ICD-9-CM ICD-10-CM   1. Osseous metastasis 198.5 C79.51     Completed Radiotherapy. Total Dose: 30 Gy   Narrative:  The patient presents for routine under treatment assessment on last day of radiotherapy.  CBCT/MVCT images/Port film x-rays were reviewed.  The chart was checked. Back pain is better, but new anterior right rib cage pain at rest has been noted for 36 hours. Tender to palpation  Physical Findings:  height is '5\' 4"'$  (1.626 m) and weight is 141 lb 9.6 oz (64.229 kg). His oral temperature is 98.6 F (37 C). His blood pressure is 100/61 and his pulse is 72. His respiration is 16 and oxygen saturation is 98%.   Tender to palpation at anterior right rib cage  IMAGING from April (CT) reviewed - cannot appreciate rib lesions on these images  Impression:  The patient has tolerated radiotherapy.  Plan:  Routine follow-up w/ Dr Lisbeth Renshaw at end of July, immediately after CT imaging as scheduled by med/onc, to verify if there is an area of progressive disease to warrant palliative RT for rib pain  Pt to continue oxycodone PRN PAIN ________________________________   Eppie Gibson, M.D.

## 2015-03-31 ENCOUNTER — Other Ambulatory Visit (HOSPITAL_BASED_OUTPATIENT_CLINIC_OR_DEPARTMENT_OTHER): Payer: Self-pay

## 2015-03-31 ENCOUNTER — Ambulatory Visit (HOSPITAL_BASED_OUTPATIENT_CLINIC_OR_DEPARTMENT_OTHER): Payer: Self-pay | Admitting: Physician Assistant

## 2015-03-31 ENCOUNTER — Encounter: Payer: Self-pay | Admitting: Physician Assistant

## 2015-03-31 VITALS — BP 105/61 | HR 78 | Temp 98.0°F | Resp 17 | Ht 64.0 in | Wt 143.6 lb

## 2015-03-31 DIAGNOSIS — K5900 Constipation, unspecified: Secondary | ICD-10-CM

## 2015-03-31 DIAGNOSIS — C3490 Malignant neoplasm of unspecified part of unspecified bronchus or lung: Secondary | ICD-10-CM

## 2015-03-31 DIAGNOSIS — C7989 Secondary malignant neoplasm of other specified sites: Secondary | ICD-10-CM

## 2015-03-31 DIAGNOSIS — C3412 Malignant neoplasm of upper lobe, left bronchus or lung: Secondary | ICD-10-CM

## 2015-03-31 DIAGNOSIS — R05 Cough: Secondary | ICD-10-CM

## 2015-03-31 DIAGNOSIS — L03039 Cellulitis of unspecified toe: Secondary | ICD-10-CM

## 2015-03-31 DIAGNOSIS — R21 Rash and other nonspecific skin eruption: Secondary | ICD-10-CM

## 2015-03-31 DIAGNOSIS — L03019 Cellulitis of unspecified finger: Secondary | ICD-10-CM

## 2015-03-31 DIAGNOSIS — M545 Low back pain: Secondary | ICD-10-CM

## 2015-03-31 DIAGNOSIS — R059 Cough, unspecified: Secondary | ICD-10-CM

## 2015-03-31 DIAGNOSIS — C801 Malignant (primary) neoplasm, unspecified: Secondary | ICD-10-CM

## 2015-03-31 DIAGNOSIS — L509 Urticaria, unspecified: Secondary | ICD-10-CM

## 2015-03-31 DIAGNOSIS — C7931 Secondary malignant neoplasm of brain: Secondary | ICD-10-CM

## 2015-03-31 LAB — CBC WITH DIFFERENTIAL/PLATELET
BASO%: 0.2 % (ref 0.0–2.0)
Basophils Absolute: 0 10*3/uL (ref 0.0–0.1)
EOS%: 15.1 % — ABNORMAL HIGH (ref 0.0–7.0)
Eosinophils Absolute: 0.9 10*3/uL — ABNORMAL HIGH (ref 0.0–0.5)
HEMATOCRIT: 36.7 % — AB (ref 38.4–49.9)
HGB: 13.3 g/dL (ref 13.0–17.1)
LYMPH%: 5.5 % — ABNORMAL LOW (ref 14.0–49.0)
MCH: 30.4 pg (ref 27.2–33.4)
MCHC: 36.2 g/dL — ABNORMAL HIGH (ref 32.0–36.0)
MCV: 83.8 fL (ref 79.3–98.0)
MONO#: 0.4 10*3/uL (ref 0.1–0.9)
MONO%: 6.2 % (ref 0.0–14.0)
NEUT#: 4.1 10*3/uL (ref 1.5–6.5)
NEUT%: 73 % (ref 39.0–75.0)
Platelets: 143 10*3/uL (ref 140–400)
RBC: 4.38 10*6/uL (ref 4.20–5.82)
RDW: 13.1 % (ref 11.0–14.6)
WBC: 5.6 10*3/uL (ref 4.0–10.3)
lymph#: 0.3 10*3/uL — ABNORMAL LOW (ref 0.9–3.3)

## 2015-03-31 LAB — COMPREHENSIVE METABOLIC PANEL (CC13)
ALT: 17 U/L (ref 0–55)
AST: 15 U/L (ref 5–34)
Albumin: 3.1 g/dL — ABNORMAL LOW (ref 3.5–5.0)
Alkaline Phosphatase: 112 U/L (ref 40–150)
Anion Gap: 7 mEq/L (ref 3–11)
BILIRUBIN TOTAL: 0.89 mg/dL (ref 0.20–1.20)
BUN: 10.1 mg/dL (ref 7.0–26.0)
CO2: 24 meq/L (ref 22–29)
Calcium: 8.5 mg/dL (ref 8.4–10.4)
Chloride: 109 mEq/L (ref 98–109)
Creatinine: 0.6 mg/dL — ABNORMAL LOW (ref 0.7–1.3)
EGFR: >90 mL/min/{1.73_m2} (ref >90)
GLUCOSE: 105 mg/dL (ref 70–140)
POTASSIUM: 3.5 meq/L (ref 3.5–5.1)
Sodium: 141 mEq/L (ref 136–145)
Total Protein: 5.5 g/dL — ABNORMAL LOW (ref 6.4–8.3)

## 2015-03-31 NOTE — Progress Notes (Signed)
Hematology and Oncology Follow Up Visit  Chad George [id number] 07-18-1971 44 y.o. 03/31/2015 3:39 PM [name], Chad Dare, MDJegede, Chad Clipper, MD   Principle Diagnosis: 44 year old gentleman with stage IV adenocarcinoma of the lung diagnosed in March of 2015. He presented with left upper lobe lung mass measuring 5.4 x 4.7 x 3.7 cm and bilateral pulmonary nodules. Pathology indicating adenocarcinoma of a lung primary. 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  Prior Therapy:  Tarceva 150 mg daily started in March of 2015. Placed on hold 12/14/13 due to rash. This was resumed again on 12/24/2013. Discontinued 10/05/2014 secondary to disease progression within the chest as well as multiple brain metastasis. Whole brain irradiation under the care of Dr. Lisbeth Renshaw expected to be completed on 11/08/2014   Current therapy:   1. Gilotrif (afatinib) 40 mg by mouth daily Therapy beginning approximately 10/05/2014. Therapy interrupted on 11/23/2014. His therapy was resumed at 20 mg daily on 12/01/2014.  Interim History:  [name] presents today for a follow-up visit. He was evaluated again on 03/20/2015 for breaking out into hives.  He had been on Gilotrif 20 mg by mouth daily since March 2016. He also had been on doxycycline daily for the past year to year and a half. He states he's not had any changes to his routine, no new soaps, lotions or detergents. He also had not started any new medications recently. Both the Gilotrif and the doxycycline were held and he was placed on a Medrol Dosepak and Atarax for the itching. His eyes have totally resolved and he's had no recurrences. He continues to have his dry cough which has responded to Tessalon Perles 100-200 mg by mouth every 8 hours.  He completed his radiation therapy under the care of Dr. Lisbeth Renshaw for his low back pain on 03/27/2015. He was receiving radiation to the L-spine.  Continues to have intermittent back  pain but not associated with any neurological deficits. He continues to ambulate without any difficulty.  He does take pain medication in the form of oxycodone and it helps at times. He does not report any numbness or tingling. Does not report any loss of bowel or bladder function.  He continues to have intermittent cough. It remains at baseline.   He is not reporting any headaches or blurry vision does not report any alteration of mental status psychiatric issues of depression. Is not reporting any chest pain or palpitation. Does not report any nausea or vomiting abdominal pain. Has not reported any hematochezia or melena. Has not reported any frequency urgency or hesitancy. Does not report any lymphadenopathy or petechiae. He does not report any skeletal pain.he does report paronychia affecting his toes and fingernails.  Remainder of his review of system is unremarkable.  Medications: I have reviewed the patient's current medications.  Current Outpatient Prescriptions  Medication Sig Dispense Refill  . acetaminophen (TYLENOL) 325 MG tablet Take 650 mg by mouth every 6 (six) hours as needed for mild pain.     Marland Kitchen afatinib dimaleate (GILOTRIF) 20 MG tablet Take 1 tablet (20 mg total) by mouth daily before breakfast. 30 tablet 0  . albuterol (PROVENTIL HFA;VENTOLIN HFA) 108 (90 BASE) MCG/ACT inhaler Inhale 2 puffs into the lungs every 6 (six) hours as needed for wheezing or shortness of breath. 1 Inhaler 1  . benzonatate (TESSALON) 100 MG capsule Take 1 to 2 capsules by mouth every 8 hours as needed for cough 45 capsule 2  .  chlorpheniramine-HYDROcodone (TUSSIONEX PENNKINETIC ER) 10-8 MG/5ML LQCR Take 5 mLs by mouth every 12 (twelve) hours as needed for cough. 1 Bottle 0  . diphenoxylate-atropine (LOMOTIL) 2.5-0.025 MG per tablet Take 2 tablets by mouth 4 (four) times daily as needed for diarrhea or loose stools. 30 tablet 1  . doxycycline (VIBRAMYCIN) 100 MG capsule Take 1 capsule (100 mg total) by mouth  2 (two) times daily. 60 capsule 1  . fluticasone (FLONASE) 50 MCG/ACT nasal spray Place 2 sprays into both nostrils daily. 16 g 6  . guaiFENesin (MUCINEX) 600 MG 12 hr tablet Take 600 mg by mouth 2 (two) times daily as needed for cough or to loosen phlegm.     . hydrOXYzine (ATARAX/VISTARIL) 10 MG tablet Take 1 tablet (10 mg total) by mouth 3 (three) times daily as needed for itching. 30 tablet 0  . Lactulose 20 GM/30ML SOLN Take 30 cc by mouth every 4 hours as needed for constipation. 236 mL 1  . loratadine (CLARITIN) 10 MG tablet Take 1 tablet (10 mg total) by mouth daily. (Patient taking differently: Take 10 mg by mouth daily as needed for allergies. ) 30 tablet 11  . metoCLOPramide (REGLAN) 10 MG tablet Take 1 tablet (10 mg total) by mouth every 6 (six) hours as needed for nausea or vomiting (nausea/headache). 20 tablet 0  . ondansetron (ZOFRAN ODT) 8 MG disintegrating tablet Take 1 tablet (8 mg total) by mouth every 12 (twelve) hours as needed for nausea or vomiting. 30 tablet 0  . ondansetron (ZOFRAN) 8 MG tablet Take 1 tablet (8 mg total) by mouth every 12 (twelve) hours as needed for nausea or vomiting. 30 tablet 0  . oxyCODONE (OXY IR/ROXICODONE) 5 MG immediate release tablet Take 1-2 tablets (5-10 mg total) by mouth every 6 (six) hours as needed for severe pain. 30 tablet 0  . methylPREDNISolone (MEDROL DOSEPAK) 4 MG TBPK tablet Take as directed with food (Patient not taking: Reported on 03/31/2015) 21 tablet 0   No current facility-administered medications for this visit.     Allergies:  Allergies  Allergen Reactions  . Amoxicillin-Pot Clavulanate Other (See Comments)    UNKNOWN  . Penicillins Rash    Past Medical History, Surgical history, Social history, and Family History were reviewed and updated.    Physical Exam: Blood pressure 105/61, pulse 78, temperature 98 F (36.7 C), temperature source Oral, resp. rate 17, height _0  (1.626 m), weight 143 lb 9.6 oz (65.137 kg),  SpO2 98 %. ECOG: 1 General appearance: alert and cooperative not in any distress. Chronically ill-appearing. Head: Normocephalic, no masses or lesions.  Neck: no adenopathy, no thyroid masses. Lymph nodes: Cervical, supraclavicular, and axillary nodes normal. Heart:regular rate and rhythm, S1, S2 normal, no murmur, click, rub or gallop Lung:bilateral basilar rales heard. No wheezing or dullness to percussion. Abdomen: soft, non-tender, without masses or organomegaly EXT:no erythema, induration, or nodules Skin: Diffuse acneiform rash on the face chest and back have. Folliculitis noted around his left eye. Back: point tender in the lumbar region of the spine. No lesions on inspection. Neurological exam: No deficits in motor, sensory or deep tendon reflexes. Skin: Urticarial eruptions scattered over the neck back chest up her lower extremities. Large patches of urticarial eruptions on lower back and lower abdomen  Lab Results: Lab Results  Component Value Date   WBC 5.6 03/31/2015   HGB 13.3 03/31/2015   HCT 36.7* 03/31/2015   MCV 83.8 03/31/2015   PLT 143 03/31/2015  Chemistry      Component Value Date/Time   NA 141 03/31/2015 1020   NA 136 11/23/2014 1844   K 3.5 03/31/2015 1020   K 3.2* 11/23/2014 1844   CL 101 11/23/2014 1844   CO2 24 03/31/2015 1020   CO2 22 11/18/2013 0457   BUN 10.1 03/31/2015 1020   BUN 22 11/23/2014 1844   CREATININE 0.6* 03/31/2015 1020   CREATININE 0.90 11/23/2014 1844   CREATININE 0.69 08/16/2013 1727      Component Value Date/Time   CALCIUM 8.5 03/31/2015 1020   CALCIUM 9.1 11/18/2013 0457   ALKPHOS 112 03/31/2015 1020   ALKPHOS 124* 11/16/2013 0630   AST 15 03/31/2015 1020   AST 18 11/16/2013 0630   ALT 17 03/31/2015 1020   ALT 17 11/16/2013 0630   BILITOT 0.89 03/31/2015 1020   BILITOT 0.6 11/16/2013 0630       EXAM: MRI LUMBAR SPINE WITHOUT AND WITH CONTRAST  TECHNIQUE: Multiplanar and multiecho pulse sequences of the lumbar  spine were obtained without and with intravenous contrast.  CONTRAST: 44m MULTIHANCE GADOBENATE DIMEGLUMINE 529 MG/ML IV SOLN  COMPARISON: CT scan of the abdomen and pelvis dated 12/28/2014  FINDINGS: Normal conus tip at L1. Normal paraspinal soft tissues.  The patient has known sclerotic metastases in L1, L2, L4, and L5 as well as in the sacrum and iliac bones.  T12-L1 through L3-4: The discs are normal. There is no tumor in the spinal canal or neural foramina. No bone destruction at these levels. The lesion at permeates L4 and a small metastasis in the right posterior inferior aspect of L2 do enhance after contrast administration. No neural impingement.  L4-5: Tumor replaces the majority of the L4 vertebral body with patchy enhancement. However, there is no visible pathologic fracture at this time and there is no protrusion of tumor or disc material into the spinal canal. There is a slight broad-based bulge of the L4-5 disc. The L4 nerves exit without impingement. There is tumor in the right pedicle at L4 immediately age adjacent to the right L4 nerve as it exits the neural foramen but the nerve does not appear impinged upon.  L5-S1: Normal disc. No tumor in the spinal canal. No facet arthritis.  There is a mixed lytic and sclerotic metastasis in the posterior aspect of the left iliac bone adjacent to the superior aspect of the left SI joint, unchanged since the prior CT scan of 12/28/2014.  IMPRESSION: Diffuse metastatic disease without pathologic fracture or focal neural impingement.  Slight diffuse bulge of the L4-5 disc without neural impingement. Tumor in the right pedicle of L4 is immediately superior to the right L4 nerve as it exits the neural foramen but the nerve appears to exit without impingement.   EXAM: MRI HEAD WITHOUT AND WITH CONTRAST  TECHNIQUE: Multiplanar, multiecho pulse sequences of the brain and surrounding structures were  obtained without and with intravenous contrast.  CONTRAST: 15mMULTIHANCE GADOBENATE DIMEGLUMINE 529 MG/ML IV SOLN  COMPARISON: 10/10/2014  FINDINGS: There is no evidence of acute infarct, midline shift, or extra-axial fluid collection. Ventricles and sulci are within normal limits for age.  There has been a marked interval reduction in size of all of the brain metastases described on the prior study. Many lesions are no longer visible, and most of the ones that are still identified are punctate in size. A small amount of chronic blood products are associated with some of the lesions.  The largest residual enhancing lesion in the  right cerebral hemisphere is located in the anterior right temporal lobe and measures 11 x 6 mm (series 11, image 13, previously 2.7 x 1.8 cm). The largest lesion remaining in the left cerebral hemisphere measures 1.3 x 0.9 cm in the left frontal centrum semiovale (series 11, image 32, previously 2.2 x 2.0 cm). There is minimal residual edema associated with the dominant left frontal lesion and with a left temporal metastasis. No new enhancing lesions are identified.  A 4 mm enhancing focus in the left parietal bone is unchanged and may represent a small venous lake or focus of granulation tissue, with metastasis felt less likely although again not excluded. 8 mm oval T1 and T2 hypointense, nonenhancing focus in the clivus is unchanged and likely benign, such as a bone island. Orbits are unremarkable. Minimal paranasal sinus mucosal thickening and small bilateral mastoid effusions are noted. Major intracranial vascular flow voids are preserved.  IMPRESSION: Positive response to therapy with marked decreased size of all brain metastases as above. No new lesions.  Impression and Plan:   44 year-old gentleman with the following issues:  1. Stage IV adenocarcinoma of the lung with a left lung mass and bilateral pulmonary nodules. His tumor  is EGFR mutated but the ALK mutation was not detected.    CT scan on 10/04/2014  showed progression of disease on Tarceva..His parenchymal metastasis as well as lymph node metastasis have increased. After 6 weeks of Afatinib, he experienced a number of side effects that required treatment interruption.   He is currently on Gilotrif  at 20 mg daily and have tolerated it well. CT scan on 12/28/2014 compared to 10/04/2014 showed for the most part stable disease. The plan is to continue with the same dose and schedule. I will repeat his CT scan in July 2016. We can use a different salvage therapy a few showed progression of disease. That will be in the form of chemotherapy versus a different targeted agent after [facility] testing.  2. Urticarial eruptions: Resolved-Etiology unclear however, it could either be secondary to Gilotrif or the doxycycline. He denies any new medications, soaps or detergents. Denied any specific environmental exposures that could be the cause. Resume Gilotrif 20 mg by mouth daily  3. Acneiform rash: Seems to be improving with drug holiday. He continues to use hydrocortisone cream as well as doxycycline. The acneform rash is not an active issue at this time  4. Paronychia-likely related to Gilotrif therapy. This remains improved at this time.  5. Constipation: This has improved since the last visit.  6. Brain metastasis: He is status post radiation therapy. MRI of the brain on 02/21/2015 showed excellent response to radiation.  7. Back pain: MRI on 02/09/2015 was reviewed today and did not show any evidence of epidural compression. He does have diffuse bony metastasis which is contributing to his pain. He has completed radiation therapy in the care of Dr. Lisbeth Renshaw.   8. Follow up: One week with his restaging CT scan.    Carlton Adam, PA-C  7/22/20163:39 PM

## 2015-03-31 NOTE — Patient Instructions (Signed)
Follow-up in 1 week as previously scheduled with a restaging CT scan to reevaluate your disease

## 2015-04-05 ENCOUNTER — Other Ambulatory Visit: Payer: Self-pay

## 2015-04-05 ENCOUNTER — Encounter (HOSPITAL_COMMUNITY): Payer: Self-pay

## 2015-04-05 ENCOUNTER — Ambulatory Visit (HOSPITAL_COMMUNITY)
Admission: RE | Admit: 2015-04-05 | Discharge: 2015-04-05 | Disposition: A | Discharge status: Home / Self Care | Payer: Self-pay | Source: Ambulatory Visit | Attending: Oncology | Admitting: Oncology

## 2015-04-05 ENCOUNTER — Other Ambulatory Visit (HOSPITAL_BASED_OUTPATIENT_CLINIC_OR_DEPARTMENT_OTHER): Payer: Self-pay

## 2015-04-05 DIAGNOSIS — C7951 Secondary malignant neoplasm of bone: Secondary | ICD-10-CM | POA: Insufficient documentation

## 2015-04-05 DIAGNOSIS — C787 Secondary malignant neoplasm of liver and intrahepatic bile duct: Secondary | ICD-10-CM | POA: Insufficient documentation

## 2015-04-05 DIAGNOSIS — C7989 Secondary malignant neoplasm of other specified sites: Secondary | ICD-10-CM

## 2015-04-05 DIAGNOSIS — C801 Malignant (primary) neoplasm, unspecified: Secondary | ICD-10-CM

## 2015-04-05 DIAGNOSIS — C7931 Secondary malignant neoplasm of brain: Secondary | ICD-10-CM

## 2015-04-05 DIAGNOSIS — C3490 Malignant neoplasm of unspecified part of unspecified bronchus or lung: Secondary | ICD-10-CM | POA: Insufficient documentation

## 2015-04-05 DIAGNOSIS — C3412 Malignant neoplasm of upper lobe, left bronchus or lung: Secondary | ICD-10-CM

## 2015-04-05 LAB — CBC WITH DIFFERENTIAL/PLATELET
BASO%: 0.9 % (ref 0.0–2.0)
Basophils Absolute: 0 10*3/uL (ref 0.0–0.1)
EOS ABS: 0.6 10*3/uL — AB (ref 0.0–0.5)
EOS%: 18.2 % — ABNORMAL HIGH (ref 0.0–7.0)
HEMATOCRIT: 37.6 % — AB (ref 38.4–49.9)
HEMOGLOBIN: 13.3 g/dL (ref 13.0–17.1)
LYMPH#: 0.3 10*3/uL — AB (ref 0.9–3.3)
LYMPH%: 8.8 % — ABNORMAL LOW (ref 14.0–49.0)
MCH: 30.3 pg (ref 27.2–33.4)
MCHC: 35.4 g/dL (ref 32.0–36.0)
MCV: 85.7 fL (ref 79.3–98.0)
MONO#: 0.3 10*3/uL (ref 0.1–0.9)
MONO%: 10.5 % (ref 0.0–14.0)
NEUT#: 2 10*3/uL (ref 1.5–6.5)
NEUT%: 61.6 % (ref 39.0–75.0)
Platelets: 197 10*3/uL (ref 140–400)
RBC: 4.38 10*6/uL (ref 4.20–5.82)
RDW: 13.7 % (ref 11.0–14.6)
WBC: 3.2 10*3/uL — ABNORMAL LOW (ref 4.0–10.3)

## 2015-04-05 LAB — COMPREHENSIVE METABOLIC PANEL (CC13)
ALK PHOS: 112 U/L (ref 40–150)
ALT: 17 U/L (ref 0–55)
AST: 18 U/L (ref 5–34)
Albumin: 3.3 g/dL — ABNORMAL LOW (ref 3.5–5.0)
Anion Gap: 7 mEq/L (ref 3–11)
BUN: 9 mg/dL (ref 7.0–26.0)
CO2: 22 mEq/L (ref 22–29)
Calcium: 8.8 mg/dL (ref 8.4–10.4)
Chloride: 111 mEq/L — ABNORMAL HIGH (ref 98–109)
Creatinine: 0.7 mg/dL (ref 0.7–1.3)
EGFR: >90 mL/min/{1.73_m2} (ref >90)
Glucose: 88 mg/dl (ref 70–140)
Potassium: 3.6 mEq/L (ref 3.5–5.1)
Sodium: 140 mEq/L (ref 136–145)
Total Bilirubin: 0.81 mg/dL (ref 0.20–1.20)
Total Protein: 5.8 g/dL — ABNORMAL LOW (ref 6.4–8.3)

## 2015-04-05 MED ORDER — IOHEXOL 300 MG/ML  SOLN
100.0000 mL | Freq: Once | INTRAMUSCULAR | Status: AC | PRN
  Administered 2015-04-05: 100 mL via INTRAVENOUS

## 2015-04-07 ENCOUNTER — Ambulatory Visit
Admission: RE | Admit: 2015-04-07 | Discharge: 2015-04-07 | Disposition: A | Discharge status: Home / Self Care | Payer: Self-pay | Source: Ambulatory Visit | Attending: Radiation Oncology | Admitting: Radiation Oncology

## 2015-04-07 ENCOUNTER — Other Ambulatory Visit: Payer: Self-pay

## 2015-04-07 ENCOUNTER — Encounter: Payer: Self-pay | Admitting: Radiation Oncology

## 2015-04-07 ENCOUNTER — Ambulatory Visit (HOSPITAL_BASED_OUTPATIENT_CLINIC_OR_DEPARTMENT_OTHER): Payer: Self-pay | Admitting: Oncology

## 2015-04-07 ENCOUNTER — Telehealth: Payer: Self-pay | Admitting: Oncology

## 2015-04-07 VITALS — BP 96/51 | HR 74 | Temp 97.5°F | Resp 20 | Ht 64.0 in | Wt 140.4 lb

## 2015-04-07 VITALS — BP 103/61 | HR 73 | Temp 98.6°F | Resp 20 | Ht 64.0 in | Wt 142.1 lb

## 2015-04-07 DIAGNOSIS — C801 Malignant (primary) neoplasm, unspecified: Secondary | ICD-10-CM

## 2015-04-07 DIAGNOSIS — C3412 Malignant neoplasm of upper lobe, left bronchus or lung: Secondary | ICD-10-CM

## 2015-04-07 DIAGNOSIS — C3400 Malignant neoplasm of unspecified main bronchus: Secondary | ICD-10-CM

## 2015-04-07 DIAGNOSIS — K5900 Constipation, unspecified: Secondary | ICD-10-CM

## 2015-04-07 DIAGNOSIS — L03039 Cellulitis of unspecified toe: Secondary | ICD-10-CM

## 2015-04-07 DIAGNOSIS — C7931 Secondary malignant neoplasm of brain: Secondary | ICD-10-CM

## 2015-04-07 DIAGNOSIS — L03019 Cellulitis of unspecified finger: Secondary | ICD-10-CM

## 2015-04-07 DIAGNOSIS — M545 Low back pain: Secondary | ICD-10-CM

## 2015-04-07 DIAGNOSIS — C7951 Secondary malignant neoplasm of bone: Secondary | ICD-10-CM

## 2015-04-07 DIAGNOSIS — C7989 Secondary malignant neoplasm of other specified sites: Secondary | ICD-10-CM

## 2015-04-07 NOTE — Progress Notes (Signed)
Hematology and Oncology Follow Up Visit  Vashawn Ekstein [id number] [birth year] 44 y.o. 04/07/2015 4:21 PM [name], MDJegede, [name], MD   Principle Diagnosis: 44 year old gentleman with stage IV adenocarcinoma of the lung diagnosed in March of 2015. He presented with left upper lobe lung mass measuring 5.4 x 4.7 x 3.7 cm and bilateral pulmonary nodules. Pathology indicating adenocarcinoma of a lung primary. 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  Prior Therapy:  Tarceva 150 mg daily started in March of 2015. Placed on hold 12/14/13 due to rash. This was resumed again on 12/24/2013. Discontinued 10/05/2014 secondary to disease progression within the chest as well as multiple brain metastasis. Whole brain irradiation under the care of Dr. Lisbeth Renshaw expected to be completed on 11/08/2014 Status post palliative radiation therapy to the spine completed in July 2016.  Current therapy:   1. Gilotrif (afatinib) 40 mg by mouth daily Therapy beginning approximately 10/05/2014. Therapy interrupted on 11/23/2014. His therapy was resumed at 20 mg daily on 12/01/2014.  Interim History:  Mr. Dutch presents today for routine followup by himself. Since the last visit, he continues to do very well. He continues to  toleratethe dose reduction of Afatinib without any new complications. He does report his rash is much improved also his appetite has improved as well.  He also completed palliative radiation therapy to the lumbar spine which have helped his pain dramatically. He does take pain medication intermittently but have decreased in frequency. He continues to ambulate without any difficulty. He does not report any numbness or tingling. Does not report any loss of bowel or bladder function.  He is not reporting any headaches or blurry vision does not report any alteration of mental status psychiatric issues of depression. Is not reporting any chest pain or  palpitation. Does not report any nausea or vomiting abdominal pain. Has not reported any hematochezia or melena. Has not reported any frequency urgency or hesitancy. Does not report any lymphadenopathy or petechiae. He does not report any skeletal pain.he does report paronychia affecting his toes and fingernails.  Remainder of his review of system is unremarkable.  Medications: I have reviewed the patient's current medications.  Current Outpatient Prescriptions  Medication Sig Dispense Refill  . acetaminophen (TYLENOL) 325 MG tablet Take 650 mg by mouth every 6 (six) hours as needed for mild pain.     Marland Kitchen afatinib dimaleate (GILOTRIF) 20 MG tablet Take 1 tablet (20 mg total) by mouth daily before breakfast. 30 tablet 0  . albuterol (PROVENTIL HFA;VENTOLIN HFA) 108 (90 BASE) MCG/ACT inhaler Inhale 2 puffs into the lungs every 6 (six) hours as needed for wheezing or shortness of breath. 1 Inhaler 1  . benzonatate (TESSALON) 100 MG capsule Take 1 to 2 capsules by mouth every 8 hours as needed for cough 45 capsule 2  . chlorpheniramine-HYDROcodone (TUSSIONEX PENNKINETIC ER) 10-8 MG/5ML LQCR Take 5 mLs by mouth every 12 (twelve) hours as needed for cough. 1 Bottle 0  . diphenoxylate-atropine (LOMOTIL) 2.5-0.025 MG per tablet Take 2 tablets by mouth 4 (four) times daily as needed for diarrhea or loose stools. 30 tablet 1  . doxycycline (VIBRAMYCIN) 100 MG capsule Take 1 capsule (100 mg total) by mouth 2 (two) times daily. 60 capsule 1  . fluticasone (FLONASE) 50 MCG/ACT nasal spray Place 2 sprays into both nostrils daily. 16 g 6  . guaiFENesin (MUCINEX) 600 MG 12 hr tablet Take 600 mg by mouth 2 (two) times  daily as needed for cough or to loosen phlegm.     . hydrOXYzine (ATARAX/VISTARIL) 10 MG tablet Take 1 tablet (10 mg total) by mouth 3 (three) times daily as needed for itching. 30 tablet 0  . Lactulose 20 GM/30ML SOLN Take 30 cc by mouth every 4 hours as needed for constipation. 236 mL 1  . loratadine  (CLARITIN) 10 MG tablet Take 1 tablet (10 mg total) by mouth daily. (Patient taking differently: Take 10 mg by mouth daily as needed for allergies. ) 30 tablet 11  . methylPREDNISolone (MEDROL DOSEPAK) 4 MG TBPK tablet Take as directed with food 21 tablet 0  . metoCLOPramide (REGLAN) 10 MG tablet Take 1 tablet (10 mg total) by mouth every 6 (six) hours as needed for nausea or vomiting (nausea/headache). 20 tablet 0  . ondansetron (ZOFRAN ODT) 8 MG disintegrating tablet Take 1 tablet (8 mg total) by mouth every 12 (twelve) hours as needed for nausea or vomiting. 30 tablet 0  . ondansetron (ZOFRAN) 8 MG tablet Take 1 tablet (8 mg total) by mouth every 12 (twelve) hours as needed for nausea or vomiting. 30 tablet 0  . oxyCODONE (OXY IR/ROXICODONE) 5 MG immediate release tablet Take 1-2 tablets (5-10 mg total) by mouth every 6 (six) hours as needed for severe pain. 30 tablet 0   No current facility-administered medications for this visit.     Allergies:  Allergies  Allergen Reactions  . Amoxicillin-Pot Clavulanate Other (See Comments)    UNKNOWN  . Penicillins Rash    Past Medical History, Surgical history, Social history, and Family History were reviewed and updated.    Physical Exam: Blood pressure 103/61, pulse 73, temperature 98.6 F (37 C), temperature source Oral, resp. rate 20, height $RemoveBe'5\' 4"'RTVHHJNWq$  (1.626 m), weight 142 lb 1.6 oz (64.456 kg), SpO2 98 %. ECOG: 1 General appearance: alert and cooperative not in any distress.  Head: Normocephalic, no masses or lesions.  Neck: no adenopathy, no thyroid masses. Lymph nodes: Cervical, supraclavicular, and axillary nodes normal. Heart:regular rate and rhythm, S1, S2 normal, no murmur, click, rub or gallop Lung:bilateral basilar rales heard. No wheezing or dullness to percussion. Abdomen: soft, non-tender, without masses or organomegaly EXT:no erythema, induration, or nodules Skin:  No rash noted at this time. Back: point tender in the lumbar  region of the spine. No lesions on inspection. Neurological exam: No deficits in motor, sensory or deep tendon reflexes.  Lab Results: Lab Results  Component Value Date   WBC 3.2* 04/05/2015   HGB 13.3 04/05/2015   HCT 37.6* 04/05/2015   MCV 85.7 04/05/2015   PLT 197 04/05/2015     Chemistry      Component Value Date/Time   NA 140 04/05/2015 0823   NA 136 11/23/2014 1844   K 3.6 04/05/2015 0823   K 3.2* 11/23/2014 1844   CL 101 11/23/2014 1844   CO2 22 04/05/2015 0823   CO2 22 11/18/2013 0457   BUN 9.0 04/05/2015 0823   BUN 22 11/23/2014 1844   CREATININE 0.7 04/05/2015 0823   CREATININE 0.90 11/23/2014 1844   CREATININE 0.69 08/16/2013 1727      Component Value Date/Time   CALCIUM 8.8 04/05/2015 0823   CALCIUM 9.1 11/18/2013 0457   ALKPHOS 112 04/05/2015 0823   ALKPHOS 124* 11/16/2013 0630   AST 18 04/05/2015 0823   AST 18 11/16/2013 0630   ALT 17 04/05/2015 0823   ALT 17 11/16/2013 0630   BILITOT 0.81 04/05/2015 0823   BILITOT  0.6 11/16/2013 0630     EXAM: CT CHEST, ABDOMEN, AND PELVIS WITH CONTRAST  TECHNIQUE: Multidetector CT imaging of the chest, abdomen and pelvis was performed following the standard protocol during bolus administration of intravenous contrast.  CONTRAST: 150mL OMNIPAQUE IOHEXOL 300 MG/ML SOLN  COMPARISON: CT 12/28/2014, 10/04/2014  FINDINGS: CT CHEST FINDINGS  Mediastinum/Nodes: No axillary or supraclavicular lymphadenopathy. Mediastinal lymphadenopathy is mildly progressed. For exam prevascular lymph node measures 8 mm on image 17, series 2 compared to 6 mm. Prevascular lymph node on image 16 measures 10 mm increased from 7 mm. Right lower paratracheal lymph node measures 12 mm increased from 8 mm.  Lungs/Pleura: Unfortunately there is increase in the masslike consolidation in the left upper lobe extending from the hilum superiorly. Mass lesion surrounding a left upper lobe bronchus measures 6.3 by 4.0 cm compared to  2.8 by 2.3 cm.  There are innumerable new bilateral pulmonary nodules in a miliary pattern measuring approximately 1 mm each. There additional individual nodules which are between 3 and 5 mm which are increased in size. Example nodule in the right lower lobe measures 10 mm on image 44, series 4 increased 6 mm. Nodules reach confluence in the left lower lobe (image 34, series 4)  CT ABDOMEN AND PELVIS FINDINGS  Hepatobiliary: 12 mm lesion in the central left hepatic lobe on image 51, series 2 is increased from 6 mm. New lesion along the falciform ligament in the left hepatic lobe measures 9 mm on image 54, series 2. New lesion in the subcapsular right hepatic lobe on image 60 series 2 measures 7 mm.  Pancreas: Pancreas is normal. No ductal dilatation. No pancreatic inflammation.  Spleen: Stable cystic lesion the spleen measures 12 mm.  Adrenals/urinary tract: Adrenal glands and kidneys are normal. The ureters and bladder normal.  Stomach/Bowel: Stomach, small bowel, appendix, and cecum are normal. The colon and rectosigmoid colon are normal.  There is increased haziness and vascular edema within the greater omentum superior and inferior to transverse colon seen on image 67 and 74. There is no discrete measurable nodularity.  Vascular/Lymphatic: Small retroperitoneal lymph nodes superior to the celiac trunk are increased. For example 7 mm node on image 54, series 2 is increased from approximately 1 mm on prior. Similar 8 mm nodule on image 55 increased from 5 mm.  Reproductive: Prostate gland is normal.  Musculoskeletal: Stable diffuse sclerotic metastasis within the pelvis and spine. No epidural extension.  Other: No free fluid.  IMPRESSION: Chest Impression:  1. New miliary metastatic pattern with innumerable bilateral 1 mm pulmonary nodules. 2. Interval increase in size of sub cm pulmonary nodules within left and right lung. Nodules reach confluence  within the left lower lobe. 3. Interval increase and consolidative mass in the left upper lobe. 4. Mild increase in mediastinal lymphadenopathy.  Abdomen / Pelvis Impression:  1. Mild progression of hepatic metastasis with several new lesions enlargement of previous lesion. 2. Mild progression of retroperitoneal adenopathy along the celiac axis. 3. New haziness and edema within the the greater omentum and lesser omentum without measurable nodularity. Recommend attention on follow-up for peritoneal metastasis. 4. Stable sclerotic metastasis the spine and pelvis.   Electronically Signed  By: Suzy Bouchard M.D.  On: 04/05/2015 11:14           Vitals     Height Weight BMI (Calculated)    '5\' 4"'$  (1.626 m) 142 lb 1.6 oz (64.456 kg) 24.4      Interpretation Summary  CLINICAL DATA: Restaging lung cancer. Oral and chemotherapy ongoing. Subsequent treatment strategy.  EXAM: CT CHEST, ABDOMEN, AND PELVIS WITH CONTRAST  TECHNIQUE: Multidetector CT imaging of the chest, abdomen and pelvis was performed following the standard protocol during bolus administration of intravenous contrast.  CONTRAST: 118mL OMNIPAQUE IOHEXOL 300 MG/ML SOLN  COMPARISON: CT 12/28/2014, 10/04/2014  FINDINGS: CT CHEST FINDINGS  Mediastinum/Nodes: No axillary or supraclavicular lymphadenopathy. Mediastinal lymphadenopathy is mildly progressed. For exam prevascular lymph node measures 8 mm on image 17, series 2 compared to 6 mm. Prevascular lymph node on image 16 measures 10 mm increased from 7 mm. Right lower paratracheal lymph node measures 12 mm increased from 8 mm.  Lungs/Pleura: Unfortunately there is increase in the masslike consolidation in the left upper lobe extending from the hilum superiorly. Mass lesion surrounding a left upper lobe bronchus measures 6.3 by 4.0 cm compared to 2.8 by 2.3 cm.  There are innumerable new bilateral pulmonary nodules in a  miliary pattern measuring approximately 1 mm each. There additional individual nodules which are between 3 and 5 mm which are increased in size. Example nodule in the right lower lobe measures 10 mm on image 44, series 4 increased 6 mm. Nodules reach confluence in the left lower lobe (image 34, series 4)  CT ABDOMEN AND PELVIS FINDINGS  Hepatobiliary: 12 mm lesion in the central left hepatic lobe on image 51, series 2 is increased from 6 mm. New lesion along the falciform ligament in the left hepatic lobe measures 9 mm on image 54, series 2. New lesion in the subcapsular right hepatic lobe on image 60 series 2 measures 7 mm.  Pancreas: Pancreas is normal. No ductal dilatation. No pancreatic inflammation.  Spleen: Stable cystic lesion the spleen measures 12 mm.  Adrenals/urinary tract: Adrenal glands and kidneys are normal. The ureters and bladder normal.  Stomach/Bowel: Stomach, small bowel, appendix, and cecum are normal. The colon and rectosigmoid colon are normal.  There is increased haziness and vascular edema within the greater omentum superior and inferior to transverse colon seen on image 67 and 74. There is no discrete measurable nodularity.  Vascular/Lymphatic: Small retroperitoneal lymph nodes superior to the celiac trunk are increased. For example 7 mm node on image 54, series 2 is increased from approximately 1 mm on prior. Similar 8 mm nodule on image 55 increased from 5 mm.  Reproductive: Prostate gland is normal.  Musculoskeletal: Stable diffuse sclerotic metastasis within the pelvis and spine. No epidural extension.  Other: No free fluid.  IMPRESSION: Chest Impression:  1. New miliary metastatic pattern with innumerable bilateral 1 mm pulmonary nodules. 2. Interval increase in size of sub cm pulmonary nodules within left and right lung. Nodules reach confluence within the left lower lobe. 3. Interval increase and consolidative mass in the  left upper lobe. 4. Mild increase in mediastinal lymphadenopathy.  Abdomen / Pelvis Impression:  1. Mild progression of hepatic metastasis with several new lesions enlargement of previous lesion. 2. Mild progression of retroperitoneal adenopathy along the celiac axis. 3. New haziness and edema within the the greater omentum and lesser omentum without measurable nodularity. Recommend attention on follow-up for peritoneal metastasis. 4. Stable sclerotic metastasis the spine and pelvis.         44 year-old gentleman with the following issues:  1. Stage IV adenocarcinoma of the lung with a left lung mass and bilateral pulmonary nodules. His tumor is EGFR mutated but the ALK mutation was not detected.    CT scan on 10/04/2014  showed progression of disease on Tarceva..His parenchymal metastasis as well as lymph node metastasis have increased. After 6 weeks of Afatinib, he experienced a number of side effects that required treatment interruption.   He is currently on Gilotrif  at 20 mg daily and have tolerated it well.   CT scan of on 04/05/2015 was reviewed and showed mild progression of his disease. Although there is clear progression by radiographic evidence, he is clinically continue to benefit from this medication. I have requested a [facility] testing on his  Biopsy and depending on these results we'll determine the next course of action. The option is to continue the current medication till the results are available. Salvage chemotherapy was also an option if no targetable mutation is detected. We have opted to keep him on the current dose and schedule in the interim.   I also discussed with him the role of repeat biopsy if the previous sample was inadequate. We can also consider a liquid biopsy by [facility] as an option.  2. Acneiform rash: Seems to  Have resolved at this time.  3. Paronychia-likely related to Gilotrif therapy. This is improved at this time.  4.  Constipation: This has improved since the last visit.  5. Brain metastasis: He is status post radiation therapy. MRI of the brain on 02/21/2015 showed excellent response to radiation.  6. Back pain: MRI on 02/09/2015  did not show any evidence of epidural compression.  He is status post palliative radiation therapy with improvement in his pain.  7. Follow up:  In 4 weeks.  Zola Button, MD 7/29/20164:21 PM

## 2015-04-07 NOTE — Progress Notes (Signed)
Radiation Oncology         [phone] ________________________________  Name: Chad George MRN: [id number]  Date: 04/07/2015  DOB: [birth year]  Follow-Up Visit Note  CC: [name], MD  Wyatt Portela, MD  No diagnosis found.  Diagnosis: Metastatic adenocarcinoma of the lung     Interval Since Last Radiation:  5 months (10/19/14-11/08/14) Site/dose:    #1. Whole brain radiation treatment:   #2 right shoulder #3 left abdomen/spleen Each target site was treated to a dose of 37.5 Gy in 15 fractions. A total of 8 customized fields were utilized.  Narrative:  The patient returns today for routine follow-up. Denies pain, nausea, headaches, and diarrhea. Reports a good appetite. Still takes chemo pill daily. Had a CT Chest/abd/pelvis done on 04/05/15 and is here to discuss the results. Reports a little fatigue, but overall good energy levels. Reports having difficulty breathing last week and had to use his albuterol inhaler. He still has a non-productive cough.  ALLERGIES:  is allergic to amoxicillin-pot clavulanate and penicillins.  Meds: Current Outpatient Prescriptions  Medication Sig Dispense Refill  . acetaminophen (TYLENOL) 325 MG tablet Take 650 mg by mouth every 6 (six) hours as needed for mild pain.     Marland Kitchen afatinib dimaleate (GILOTRIF) 20 MG tablet Take 1 tablet (20 mg total) by mouth daily before breakfast. 30 tablet 0  . albuterol (PROVENTIL HFA;VENTOLIN HFA) 108 (90 BASE) MCG/ACT inhaler Inhale 2 puffs into the lungs every 6 (six) hours as needed for wheezing or shortness of breath. 1 Inhaler 1  . benzonatate (TESSALON) 100 MG capsule Take 1 to 2 capsules by mouth every 8 hours as needed for cough 45 capsule 2  . chlorpheniramine-HYDROcodone (TUSSIONEX PENNKINETIC ER) 10-8 MG/5ML LQCR Take 5 mLs by mouth every 12 (twelve) hours as needed for cough. 1 Bottle 0  . fluticasone (FLONASE) 50 MCG/ACT nasal spray Place 2 sprays into both nostrils daily. 16 g 6  . guaiFENesin  (MUCINEX) 600 MG 12 hr tablet Take 600 mg by mouth 2 (two) times daily as needed for cough or to loosen phlegm.     . loratadine (CLARITIN) 10 MG tablet Take 1 tablet (10 mg total) by mouth daily. (Patient taking differently: Take 10 mg by mouth daily as needed for allergies. ) 30 tablet 11  . oxyCODONE (OXY IR/ROXICODONE) 5 MG immediate release tablet Take 1-2 tablets (5-10 mg total) by mouth every 6 (six) hours as needed for severe pain. 30 tablet 0  . diphenoxylate-atropine (LOMOTIL) 2.5-0.025 MG per tablet Take 2 tablets by mouth 4 (four) times daily as needed for diarrhea or loose stools. (Patient not taking: Reported on 04/07/2015) 30 tablet 1  . doxycycline (VIBRAMYCIN) 100 MG capsule Take 1 capsule (100 mg total) by mouth 2 (two) times daily. (Patient not taking: Reported on 04/07/2015) 60 capsule 1  . hydrOXYzine (ATARAX/VISTARIL) 10 MG tablet Take 1 tablet (10 mg total) by mouth 3 (three) times daily as needed for itching. (Patient not taking: Reported on 04/07/2015) 30 tablet 0  . Lactulose 20 GM/30ML SOLN Take 30 cc by mouth every 4 hours as needed for constipation. (Patient not taking: Reported on 04/07/2015) 236 mL 1  . methylPREDNISolone (MEDROL DOSEPAK) 4 MG TBPK tablet Take as directed with food (Patient not taking: Reported on 03/31/2015) 21 tablet 0  . metoCLOPramide (REGLAN) 10 MG tablet Take 1 tablet (10 mg total) by mouth every 6 (six) hours as needed for nausea or vomiting (nausea/headache). (Patient not taking: Reported on  04/07/2015) 20 tablet 0  . ondansetron (ZOFRAN ODT) 8 MG disintegrating tablet Take 1 tablet (8 mg total) by mouth every 12 (twelve) hours as needed for nausea or vomiting. (Patient not taking: Reported on 04/07/2015) 30 tablet 0  . ondansetron (ZOFRAN) 8 MG tablet Take 1 tablet (8 mg total) by mouth every 12 (twelve) hours as needed for nausea or vomiting. (Patient not taking: Reported on 04/07/2015) 30 tablet 0   No current facility-administered medications for this  encounter.    Physical Findings: The patient is in no acute distress. Patient is alert and oriented.  height is '5\' 4"'$  (1.626 m) and weight is 140 lb 6.4 oz (63.685 kg). His oral temperature is 97.5 F (36.4 C). His blood pressure is 96/51 and his pulse is 74. His respiration is 20 and oxygen saturation is 96%.  Lungs are clear to auscultation bilaterally. Heart has regular rate and rhythm. Normoactive bowel sounds.  Lab Findings: Lab Results  Component Value Date   WBC 3.2* 04/05/2015   HGB 13.3 04/05/2015   HCT 37.6* 04/05/2015   MCV 85.7 04/05/2015   PLT 197 04/05/2015    Radiographic Findings: Ct Chest W Contrast  04/05/2015   CLINICAL DATA:  Restaging lung cancer. Oral and chemotherapy ongoing. Subsequent treatment strategy.  EXAM: CT CHEST, ABDOMEN, AND PELVIS WITH CONTRAST  TECHNIQUE: Multidetector CT imaging of the chest, abdomen and pelvis was performed following the standard protocol during bolus administration of intravenous contrast.  CONTRAST:  16m OMNIPAQUE IOHEXOL 300 MG/ML  SOLN  COMPARISON:  CT 12/28/2014, 10/04/2014  FINDINGS: CT CHEST FINDINGS  Mediastinum/Nodes: No axillary or supraclavicular lymphadenopathy. Mediastinal lymphadenopathy is mildly progressed. For exam prevascular lymph node measures 8 mm on image 17, series 2 compared to 6 mm. Prevascular lymph node on image 16 measures 10 mm increased from 7 mm. Right lower paratracheal lymph node measures 12 mm increased from 8 mm.  Lungs/Pleura: Unfortunately there is increase in the masslike consolidation in the left upper lobe extending from the hilum superiorly. Mass lesion surrounding a left upper lobe bronchus measures 6.3 by 4.0 cm compared to 2.8 by 2.3 cm.  There are innumerable new bilateral pulmonary nodules in a miliary pattern measuring approximately 1 mm each. There additional individual nodules which are between 3 and 5 mm which are increased in size. Example nodule in the right lower lobe measures 10 mm on  image 44, series 4 increased 6 mm. Nodules reach confluence in the left lower lobe (image 34, series 4)  CT ABDOMEN AND PELVIS FINDINGS  Hepatobiliary: 12 mm lesion in the central left hepatic lobe on image 51, series 2 is increased from 6 mm. New lesion along the falciform ligament in the left hepatic lobe measures 9 mm on image 54, series 2. New lesion in the subcapsular right hepatic lobe on image 60 series 2 measures 7 mm.  Pancreas: Pancreas is normal. No ductal dilatation. No pancreatic inflammation.  Spleen: Stable cystic lesion the spleen measures 12 mm.  Adrenals/urinary tract: Adrenal glands and kidneys are normal. The ureters and bladder normal.  Stomach/Bowel: Stomach, small bowel, appendix, and cecum are normal. The colon and rectosigmoid colon are normal.  There is increased haziness and vascular edema within the greater omentum superior and inferior to transverse colon seen on image 67 and 74. There is no discrete measurable nodularity.  Vascular/Lymphatic: Small retroperitoneal lymph nodes superior to the celiac trunk are increased. For example 7 mm node on image 54, series 2 is increased from  approximately 1 mm on prior. Similar 8 mm nodule on image 55 increased from 5 mm.  Reproductive: Prostate gland is normal.  Musculoskeletal: Stable diffuse sclerotic metastasis within the pelvis and spine. No epidural extension.  Other: No free fluid.  IMPRESSION: Chest Impression:  1. New miliary metastatic pattern with innumerable bilateral 1 mm pulmonary nodules. 2. Interval increase in size of sub cm pulmonary nodules within left and right lung. Nodules reach confluence within the left lower lobe. 3. Interval increase and consolidative mass in the left upper lobe. 4. Mild increase in mediastinal lymphadenopathy.  Abdomen / Pelvis Impression:  1. Mild progression of hepatic metastasis with several new lesions enlargement of previous lesion. 2. Mild progression of retroperitoneal adenopathy along the celiac  axis. 3. New haziness and edema within the the greater omentum and lesser omentum without measurable nodularity. Recommend attention on follow-up for peritoneal metastasis. 4. Stable sclerotic metastasis the spine and pelvis.   Electronically Signed   By: Suzy Bouchard M.D.   On: 04/05/2015 11:14   Ct Abdomen Pelvis W Contrast  04/05/2015   CLINICAL DATA:  Restaging lung cancer. Oral and chemotherapy ongoing. Subsequent treatment strategy.  EXAM: CT CHEST, ABDOMEN, AND PELVIS WITH CONTRAST  TECHNIQUE: Multidetector CT imaging of the chest, abdomen and pelvis was performed following the standard protocol during bolus administration of intravenous contrast.  CONTRAST:  193m OMNIPAQUE IOHEXOL 300 MG/ML  SOLN  COMPARISON:  CT 12/28/2014, 10/04/2014  FINDINGS: CT CHEST FINDINGS  Mediastinum/Nodes: No axillary or supraclavicular lymphadenopathy. Mediastinal lymphadenopathy is mildly progressed. For exam prevascular lymph node measures 8 mm on image 17, series 2 compared to 6 mm. Prevascular lymph node on image 16 measures 10 mm increased from 7 mm. Right lower paratracheal lymph node measures 12 mm increased from 8 mm.  Lungs/Pleura: Unfortunately there is increase in the masslike consolidation in the left upper lobe extending from the hilum superiorly. Mass lesion surrounding a left upper lobe bronchus measures 6.3 by 4.0 cm compared to 2.8 by 2.3 cm.  There are innumerable new bilateral pulmonary nodules in a miliary pattern measuring approximately 1 mm each. There additional individual nodules which are between 3 and 5 mm which are increased in size. Example nodule in the right lower lobe measures 10 mm on image 44, series 4 increased 6 mm. Nodules reach confluence in the left lower lobe (image 34, series 4)  CT ABDOMEN AND PELVIS FINDINGS  Hepatobiliary: 12 mm lesion in the central left hepatic lobe on image 51, series 2 is increased from 6 mm. New lesion along the falciform ligament in the left hepatic lobe  measures 9 mm on image 54, series 2. New lesion in the subcapsular right hepatic lobe on image 60 series 2 measures 7 mm.  Pancreas: Pancreas is normal. No ductal dilatation. No pancreatic inflammation.  Spleen: Stable cystic lesion the spleen measures 12 mm.  Adrenals/urinary tract: Adrenal glands and kidneys are normal. The ureters and bladder normal.  Stomach/Bowel: Stomach, small bowel, appendix, and cecum are normal. The colon and rectosigmoid colon are normal.  There is increased haziness and vascular edema within the greater omentum superior and inferior to transverse colon seen on image 67 and 74. There is no discrete measurable nodularity.  Vascular/Lymphatic: Small retroperitoneal lymph nodes superior to the celiac trunk are increased. For example 7 mm node on image 54, series 2 is increased from approximately 1 mm on prior. Similar 8 mm nodule on image 55 increased from 5 mm.  Reproductive: Prostate gland  is normal.  Musculoskeletal: Stable diffuse sclerotic metastasis within the pelvis and spine. No epidural extension.  Other: No free fluid.  IMPRESSION: Chest Impression:  1. New miliary metastatic pattern with innumerable bilateral 1 mm pulmonary nodules. 2. Interval increase in size of sub cm pulmonary nodules within left and right lung. Nodules reach confluence within the left lower lobe. 3. Interval increase and consolidative mass in the left upper lobe. 4. Mild increase in mediastinal lymphadenopathy.  Abdomen / Pelvis Impression:  1. Mild progression of hepatic metastasis with several new lesions enlargement of previous lesion. 2. Mild progression of retroperitoneal adenopathy along the celiac axis. 3. New haziness and edema within the the greater omentum and lesser omentum without measurable nodularity. Recommend attention on follow-up for peritoneal metastasis. 4. Stable sclerotic metastasis the spine and pelvis.   Electronically Signed   By: Suzy Bouchard M.D.   On: 04/05/2015 11:14     Impression:  The patient is recovering from the effects of radiation. The pt clinically looks good today, but CT scan shows areas of progression especially a new miliary pattern of disease in the lungs. We discussed that briefly today and he will see Dr. Alen Blew today for further discussion of systemic treatment options.  Plan: I will order another brain MRI in 6 weeks and the pt will have a f/u with me afterwards.  This document serves as a record of services personally performed by Kyung Rudd, MD. It was created on his behalf by Darcus Austin, a trained medical scribe. The creation of this record is based on the scribe's personal observations and the provider's statements to them. This document has been checked and approved by the attending provider.     ____________________________________   Jodelle Gross, MD, PhD

## 2015-04-07 NOTE — Telephone Encounter (Signed)
Gave adn printed appt sched and avs for pt for Aug

## 2015-04-07 NOTE — Progress Notes (Signed)
Follow up s/p radiation  03/27/15  No c/o pain, no nausea, no head aches, no diarrhea, appetite good, still takes chemo pill daily,  CT Chest/abdpelvis done 04/05/15 , here to discuss results, energy level good  BP 96/51 mmHg  Pulse 74  Temp(Src) 97.5 F (36.4 C) (Oral)  Resp 20  Ht '5\' 4"'$  (1.626 m)  Wt 140 lb 6.4 oz (63.685 kg)  BMI 24.09 kg/m2  SpO2 96%  Wt Readings from Last 3 Encounters:  04/07/15 140 lb 6.4 oz (63.685 kg)  03/31/15 143 lb 9.6 oz (65.137 kg)  03/27/15 141 lb 9.6 oz (64.229 kg)   8:49 AM

## 2015-04-10 ENCOUNTER — Telehealth: Payer: Self-pay | Admitting: *Deleted

## 2015-04-10 NOTE — Telephone Encounter (Signed)
CALLED PATIENT TO INFORM OF MRI FOR 05-19-15- ARRIVAL TIME - 2:45 PM AND HIS FU WITH DR. MOODY TO GET HIS RESULTS ON 05-25-15 @ 4:15 PM WITH DR. MOODY, SPOKE WITH PATIENT AND HE IS AWARE OF THESE  APPTS.

## 2015-04-12 ENCOUNTER — Other Ambulatory Visit: Payer: Self-pay | Admitting: *Deleted

## 2015-04-12 DIAGNOSIS — C7931 Secondary malignant neoplasm of brain: Secondary | ICD-10-CM

## 2015-04-12 DIAGNOSIS — C801 Malignant (primary) neoplasm, unspecified: Secondary | ICD-10-CM

## 2015-04-12 DIAGNOSIS — C3491 Malignant neoplasm of unspecified part of right bronchus or lung: Secondary | ICD-10-CM

## 2015-04-12 MED ORDER — AFATINIB DIMALEATE 20 MG PO TABS: 20.0000 mg | ORAL_TABLET | Freq: Every day | ORAL | Status: DC

## 2015-04-12 NOTE — Telephone Encounter (Signed)
afatinib script faxed to [facility] [phone]

## 2015-04-12 NOTE — Addendum Note (Signed)
Addended by: Wyatt Portela on: 04/12/2015 08:42 AM   Modules accepted: Orders

## 2015-04-14 ENCOUNTER — Other Ambulatory Visit: Payer: Self-pay | Admitting: Radiology

## 2015-04-17 ENCOUNTER — Other Ambulatory Visit: Payer: Self-pay | Admitting: Radiology

## 2015-04-18 ENCOUNTER — Ambulatory Visit (HOSPITAL_COMMUNITY)
Admission: RE | Admit: 2015-04-18 | Discharge: 2015-04-18 | Disposition: A | Discharge status: Home / Self Care | Payer: Self-pay | Source: Ambulatory Visit | Attending: Oncology | Admitting: Oncology

## 2015-04-18 ENCOUNTER — Encounter (HOSPITAL_COMMUNITY): Payer: Self-pay

## 2015-04-18 DIAGNOSIS — C3490 Malignant neoplasm of unspecified part of unspecified bronchus or lung: Secondary | ICD-10-CM | POA: Insufficient documentation

## 2015-04-18 DIAGNOSIS — C3400 Malignant neoplasm of unspecified main bronchus: Secondary | ICD-10-CM

## 2015-04-18 DIAGNOSIS — C787 Secondary malignant neoplasm of liver and intrahepatic bile duct: Secondary | ICD-10-CM | POA: Insufficient documentation

## 2015-04-18 DIAGNOSIS — Z79899 Other long term (current) drug therapy: Secondary | ICD-10-CM | POA: Insufficient documentation

## 2015-04-18 DIAGNOSIS — C801 Malignant (primary) neoplasm, unspecified: Secondary | ICD-10-CM

## 2015-04-18 LAB — CBC
HEMATOCRIT: 38.1 % — AB (ref 39.0–52.0)
Hemoglobin: 13.5 g/dL (ref 13.0–17.0)
MCH: 30.4 pg (ref 26.0–34.0)
MCHC: 35.4 g/dL (ref 30.0–36.0)
MCV: 85.8 fL (ref 78.0–100.0)
PLATELETS: 209 10*3/uL (ref 150–400)
RBC: 4.44 MIL/uL (ref 4.22–5.81)
RDW: 14 % (ref 11.5–15.5)
WBC: 4.2 10*3/uL (ref 4.0–10.5)

## 2015-04-18 LAB — PROTIME-INR
INR: 1.11 (ref 0.00–1.49)
Prothrombin Time: 14.5 seconds (ref 11.6–15.2)

## 2015-04-18 LAB — APTT: aPTT: 29 seconds (ref 24–37)

## 2015-04-18 MED ORDER — SODIUM CHLORIDE 0.9 % IV SOLN
INTRAVENOUS | Status: DC
  Administered 2015-04-18: 13:00:00 via INTRAVENOUS

## 2015-04-18 MED ORDER — MIDAZOLAM HCL 2 MG/2ML IJ SOLN
INTRAMUSCULAR | Status: AC
  Filled 2015-04-18: qty 6

## 2015-04-18 MED ORDER — FENTANYL CITRATE (PF) 100 MCG/2ML IJ SOLN
INTRAMUSCULAR | Status: AC | PRN
  Administered 2015-04-18: 50 ug via INTRAVENOUS

## 2015-04-18 MED ORDER — FENTANYL CITRATE (PF) 100 MCG/2ML IJ SOLN
INTRAMUSCULAR | Status: AC
  Filled 2015-04-18: qty 4

## 2015-04-18 MED ORDER — MIDAZOLAM HCL 2 MG/2ML IJ SOLN
INTRAMUSCULAR | Status: AC | PRN
  Administered 2015-04-18: 0.5 mg via INTRAVENOUS
  Administered 2015-04-18 (×2): 1 mg via INTRAVENOUS

## 2015-04-18 NOTE — H&P (Signed)
HPI: Patient with stage IV adenocarcinoma of lung and now progressed disease on imaging. He has been scheduled today for liver mass biopsy.   The patient has had a H&P performed within the last 30 days, all history, medications, and exam have been reviewed. The patient denies any interval changes since the H&P.  Medications: Prior to Admission medications   Medication Sig Start Date End Date Taking? Authorizing Provider  acetaminophen (TYLENOL) 325 MG tablet Take 650 mg by mouth every 6 (six) hours as needed for mild pain.    Yes Historical Provider, MD  afatinib dimaleate (GILOTRIF) 20 MG tablet Take 1 tablet (20 mg total) by mouth daily before breakfast. 04/12/15  Yes Wyatt Portela, MD  albuterol (PROVENTIL HFA;VENTOLIN HFA) 108 (90 BASE) MCG/ACT inhaler Inhale 2 puffs into the lungs every 6 (six) hours as needed for wheezing or shortness of breath. 06/30/14  Yes Tresa Garter, MD  benzonatate (TESSALON) 100 MG capsule Take 1 to 2 capsules by mouth every 8 hours as needed for cough 03/10/14  Yes Adrena E Johnson, PA-C  chlorpheniramine-HYDROcodone (TUSSIONEX PENNKINETIC ER) 10-8 MG/5ML LQCR Take 5 mLs by mouth every 12 (twelve) hours as needed for cough. 11/21/13  Yes Kelvin Cellar, MD  fluticasone (FLONASE) 50 MCG/ACT nasal spray Place 2 sprays into both nostrils daily. 09/15/14  Yes Tresa Garter, MD  guaiFENesin (MUCINEX) 600 MG 12 hr tablet Take 600 mg by mouth 2 (two) times daily as needed for cough or to loosen phlegm.    Yes Historical Provider, MD  loratadine (CLARITIN) 10 MG tablet Take 1 tablet (10 mg total) by mouth daily. Patient taking differently: Take 10 mg by mouth daily as needed for allergies.  10/16/13  Yes Reyne Dumas, MD  metoCLOPramide (REGLAN) 10 MG tablet Take 1 tablet (10 mg total) by mouth every 6 (six) hours as needed for nausea or vomiting (nausea/headache). 11/23/14  Yes Orlie Dakin, MD  oxyCODONE (OXY IR/ROXICODONE) 5 MG immediate release tablet Take 1-2  tablets (5-10 mg total) by mouth every 6 (six) hours as needed for severe pain. 07/28/14  Yes Adrena E Johnson, PA-C  diphenoxylate-atropine (LOMOTIL) 2.5-0.025 MG per tablet Take 2 tablets by mouth 4 (four) times daily as needed for diarrhea or loose stools. 11/02/14   Carlton Adam, PA-C  doxycycline (VIBRAMYCIN) 100 MG capsule Take 1 capsule (100 mg total) by mouth 2 (two) times daily. Patient not taking: Reported on 04/14/2015 03/02/15   Wyatt Portela, MD  hydrOXYzine (ATARAX/VISTARIL) 10 MG tablet Take 1 tablet (10 mg total) by mouth 3 (three) times daily as needed for itching. 03/20/15   Carlton Adam, PA-C  Lactulose 20 GM/30ML SOLN Take 30 cc by mouth every 4 hours as needed for constipation. Patient not taking: Reported on 04/14/2015 11/30/14   Wyatt Portela, MD  methylPREDNISolone (MEDROL DOSEPAK) 4 MG TBPK tablet Take as directed with food Patient not taking: Reported on 04/14/2015 03/20/15   Burnetta Sabin E Johnson, PA-C  ondansetron (ZOFRAN ODT) 8 MG disintegrating tablet Take 1 tablet (8 mg total) by mouth every 12 (twelve) hours as needed for nausea or vomiting. Patient not taking: Reported on 04/14/2015 11/23/14   Susanne Borders, NP  ondansetron (ZOFRAN) 8 MG tablet Take 1 tablet (8 mg total) by mouth every 12 (twelve) hours as needed for nausea or vomiting. Patient not taking: Reported on 04/14/2015 11/23/14   Kyung Rudd, MD     Vital Signs: BP 102/65 mmHg  Pulse 74  Temp(Src) 98.4 F (36.9 C) (Oral)  Resp 16  SpO2 98%  Physical Exam  Constitutional: He is oriented to person, place, and time. No distress.  HENT:  Head: Normocephalic and atraumatic.  Neck: No tracheal deviation present.  Cardiovascular: Normal rate and regular rhythm.  Exam reveals no gallop and no friction rub.   No murmur heard. Pulmonary/Chest: Effort normal and breath sounds normal. No respiratory distress. He has no wheezes. He has no rales.  Abdominal: Soft. Bowel sounds are normal. He exhibits no distension.  There is no tenderness.  Neurological: He is alert and oriented to person, place, and time.  Skin: He is not diaphoretic.    Mallampati Score:  MD Evaluation Airway: WNL Heart: WNL Abdomen: WNL Chest/ Lungs: WNL ASA  Classification: 3 Mallampati/Airway Score: Two  Labs:  CBC:  Recent Labs  02/02/15 1008 03/02/15 1240 03/31/15 1020 04/05/15 0823  WBC 4.1 4.3 5.6 3.2*  HGB 13.7 13.9 13.3 13.3  HCT 38.1* 39.6 36.7* 37.6*  PLT 217 235 143 197    COAGS: No results for input(s): INR, APTT in the last 8760 hours.  BMP:  Recent Labs  11/23/14 1844  02/02/15 1008 03/02/15 1240 03/31/15 1020 04/05/15 0823  NA 136  < > 139 141 141 140  K 3.2*  < > 3.5 3.7 3.5 3.6  CL 101  --   --   --   --   --   CO2  --   < > 20* '26 24 22  '$ GLUCOSE 118*  < > 93 98 105 88  BUN 22  < > 10.7 10.5 10.1 9.0  CALCIUM  --   < > 8.7 8.6 8.5 8.8  CREATININE 0.90  < > 0.7 0.7 0.6* 0.7  < > = values in this interval not displayed.  LIVER FUNCTION TESTS:  Recent Labs  02/02/15 1008 03/02/15 1240 03/31/15 1020 04/05/15 0823  BILITOT 1.00 0.60 0.89 0.81  AST '18 17 15 18  '$ ALT '15 15 17 17  '$ ALKPHOS 119 147 112 112  PROT 6.0* 6.1* 5.5* 5.8*  ALBUMIN 3.6 3.6 3.1* 3.3*    Assessment/Plan:  Stage IV adenocarcinoma of the lung CT 04/05/15 with progression of disease Seen by Dr. Alen Blew  Scheduled today for image guided liver lesion biopsy with sedation with foundation medicine testing   The patient has been NPO, no blood thinners taken, labs and vitals have been reviewed. Risks and Benefits discussed with the patient including, but not limited to bleeding, infection, damage to adjacent structures or low yield requiring additional tests. All of the patient's questions were answered, patient is agreeable to proceed. Consent signed and in chart.    SignedHedy Jacob 04/18/2015, 12:35 PM

## 2015-04-18 NOTE — Procedures (Signed)
L lobe liver lesion biopsy 18 g core times three No comp/EBL

## 2015-04-18 NOTE — Discharge Instructions (Signed)
Liver Biopsy, Care After °Refer to this sheet in the next few weeks. These instructions provide you with information on caring for yourself after your procedure. Your health care provider may also give you more specific instructions. Your treatment has been planned according to current medical practices, but problems sometimes occur. Call your health care provider if you have any problems or questions after your procedure. °WHAT TO EXPECT AFTER THE PROCEDURE °After your procedure, it is typical to have the following: °· A small amount of discomfort in the area where the biopsy was done and in the right shoulder or shoulder blade. °· A small amount of bruising around the area where the biopsy was done and on the skin over the liver. °· Sleepiness and fatigue for the rest of the day. °HOME CARE INSTRUCTIONS  °· Rest at home for 1-2 days or as directed by your health care provider. °· Have a friend or family member stay with you for at least 24 hours. °· Because of the medicines used during the procedure, you should not do the following things in the first 24 hours: °¨ Drive. °¨ Use machinery. °¨ Be responsible for the care of other people. °¨ Sign legal documents. °¨ Take a bath or shower. °· There are many different ways to close and cover an incision, including stitches, skin glue, and adhesive strips. Follow your health care provider's instructions on: °¨ Incision care. °¨ Bandage (dressing) changes and removal. °¨ Incision closure removal. °· Do not drink alcohol in the first week. °· Do not lift more than 5 pounds or play contact sports for 2 weeks after this test. °· Take medicines only as directed by your health care provider. Do not take medicine containing aspirin or non-steroidal anti-inflammatory medicines such as ibuprofen for 1 week after this test. °· It is your responsibility to get your test results. °SEEK MEDICAL CARE IF:  °· You have increased bleeding from an incision that results in more than a  small spot of blood. °· You have redness, swelling, or increasing pain in any incisions. °· You notice a discharge or a bad smell coming from any of your incisions. °· You have a fever or chills. °SEEK IMMEDIATE MEDICAL CARE IF:  °· You develop swelling, bloating, or pain in your abdomen. °· You become dizzy or faint. °· You develop a rash. °· You are nauseous or vomit. °· You have difficulty breathing, feel short of breath, or feel faint. °· You develop chest pain. °· You have problems with your speech or vision. °· You have trouble balancing or moving your arms or legs. °Document Released: 03/15/2005 Document Revised: 01/10/2014 Document Reviewed: 10/22/2013 °[employer]® Patient Information ©2015 [employer]. This information is not intended to replace advice given to you by your health care provider. Make sure you discuss any questions you have with your health care provider. °Conscious Sedation, Adult, Care After °Refer to this sheet in the next few weeks. These instructions provide you with information on caring for yourself after your procedure. Your health care provider may also give you more specific instructions. Your treatment has been planned according to current medical practices, but problems sometimes occur. Call your health care provider if you have any problems or questions after your procedure. °WHAT TO EXPECT AFTER THE PROCEDURE  °After your procedure: °· You may feel sleepy, clumsy, and have poor balance for several hours. °· Vomiting may occur if you eat too soon after the procedure. °HOME CARE INSTRUCTIONS °· Do not participate   in any activities where you could become injured for at least 24 hours. Do not: °¨ Drive. °¨ Swim. °¨ Ride a bicycle. °¨ Operate heavy machinery. °¨ Cook. °¨ Use power tools. °¨ Climb ladders. °¨ Work from a high place. °· Do not make important decisions or sign legal documents until you are improved. °· If you vomit, drink water, juice, or soup when you can drink without  vomiting. Make sure you have little or no nausea before eating solid foods. °· Only take over-the-counter or prescription medicines for pain, discomfort, or fever as directed by your health care provider. °· Make sure you and your family fully understand everything about the medicines given to you, including what side effects may occur. °· You should not drink alcohol, take sleeping pills, or take medicines that cause drowsiness for at least 24 hours. °· If you smoke, do not smoke without supervision. °· If you are feeling better, you may resume normal activities 24 hours after you were sedated. °· Keep all appointments with your health care provider. °SEEK MEDICAL CARE IF: °· Your skin is pale or bluish in color. °· You continue to feel nauseous or vomit. °· Your pain is getting worse and is not helped by medicine. °· You have bleeding or swelling. °· You are still sleepy or feeling clumsy after 24 hours. °SEEK IMMEDIATE MEDICAL CARE IF: °· You develop a rash. °· You have difficulty breathing. °· You develop any type of allergic problem. °· You have a fever. °MAKE SURE YOU: °· Understand these instructions. °· Will watch your condition. °· Will get help right away if you are not doing well or get worse. °Document Released: 06/16/2013 Document Reviewed: 06/16/2013 °[employer]® Patient Information ©2015 [employer]. This information is not intended to replace advice given to you by your health care provider. Make sure you discuss any questions you have with your health care provider. ° °

## 2015-04-26 NOTE — Progress Notes (Signed)
  Radiation Oncology         [phone] ________________________________  Name: Chad George MRN: [id number]  Date: 03/27/2015  DOB: [birth year]  End of Treatment Note  Diagnosis:   Metastatic lung cancer with osseous metastasis     Indication for treatment::  palliative       Radiation treatment dates:   03/14/2015 through 03/27/2015  Site/dose:   The patient was treated to multiple regions within the lumbar spine/pelvis concurrently. To accomplish this, the patient was treated with a 3-D conformal technique on our tomotherapy unit. The patient was treated to a dose of 30 gray in 10 fractions at 3 gray per fraction.  Narrative: The patient tolerated radiation treatment relatively well.   He was able to complete his prescribed course of treatment without significant delay.  Plan: The patient has completed radiation treatment. The patient will return to radiation oncology clinic for routine followup in one month. I advised the patient to call or return sooner if they have any questions or concerns related to their recovery or treatment. ________________________________  Jodelle Gross, M.D., Ph.D.

## 2015-04-27 ENCOUNTER — Other Ambulatory Visit: Payer: Self-pay | Admitting: Internal Medicine

## 2015-05-03 ENCOUNTER — Other Ambulatory Visit: Payer: Self-pay | Admitting: Internal Medicine

## 2015-05-03 DIAGNOSIS — R05 Cough: Secondary | ICD-10-CM

## 2015-05-03 DIAGNOSIS — R059 Cough, unspecified: Secondary | ICD-10-CM

## 2015-05-03 MED ORDER — ALBUTEROL SULFATE HFA 108 (90 BASE) MCG/ACT IN AERS: 2.0000 | INHALATION_SPRAY | Freq: Four times a day (QID) | RESPIRATORY_TRACT | Status: DC | PRN

## 2015-05-03 MED ORDER — BENZONATATE 100 MG PO CAPS: ORAL_CAPSULE | ORAL | Status: AC

## 2015-05-03 NOTE — Telephone Encounter (Signed)
Patient came into office requesting medication refill on albuterol (PROVENTIL HFA;VENTOLIN HFA) 108 (90 BASE) MCG/ACT inhaler, please f/u with patient

## 2015-05-05 ENCOUNTER — Ambulatory Visit (HOSPITAL_BASED_OUTPATIENT_CLINIC_OR_DEPARTMENT_OTHER): Payer: Self-pay | Admitting: Oncology

## 2015-05-05 ENCOUNTER — Other Ambulatory Visit (HOSPITAL_COMMUNITY)
Admission: RE | Admit: 2015-05-05 | Discharge: 2015-05-05 | Disposition: A | Discharge status: Home / Self Care | Payer: Self-pay | Source: Ambulatory Visit | Attending: Oncology | Admitting: Oncology

## 2015-05-05 ENCOUNTER — Telehealth: Payer: Self-pay | Admitting: Oncology

## 2015-05-05 ENCOUNTER — Other Ambulatory Visit (HOSPITAL_BASED_OUTPATIENT_CLINIC_OR_DEPARTMENT_OTHER): Payer: Self-pay

## 2015-05-05 VITALS — BP 100/57 | HR 69 | Temp 98.1°F | Resp 18 | Ht 64.0 in | Wt 140.8 lb

## 2015-05-05 DIAGNOSIS — C801 Malignant (primary) neoplasm, unspecified: Secondary | ICD-10-CM

## 2015-05-05 DIAGNOSIS — C7931 Secondary malignant neoplasm of brain: Secondary | ICD-10-CM

## 2015-05-05 DIAGNOSIS — C3400 Malignant neoplasm of unspecified main bronchus: Secondary | ICD-10-CM

## 2015-05-05 DIAGNOSIS — C3412 Malignant neoplasm of upper lobe, left bronchus or lung: Secondary | ICD-10-CM

## 2015-05-05 DIAGNOSIS — K5900 Constipation, unspecified: Secondary | ICD-10-CM

## 2015-05-05 DIAGNOSIS — C3491 Malignant neoplasm of unspecified part of right bronchus or lung: Secondary | ICD-10-CM | POA: Insufficient documentation

## 2015-05-05 DIAGNOSIS — C7989 Secondary malignant neoplasm of other specified sites: Secondary | ICD-10-CM

## 2015-05-05 DIAGNOSIS — M545 Low back pain: Secondary | ICD-10-CM

## 2015-05-05 DIAGNOSIS — L03031 Cellulitis of right toe: Secondary | ICD-10-CM

## 2015-05-05 DIAGNOSIS — L03032 Cellulitis of left toe: Secondary | ICD-10-CM

## 2015-05-05 LAB — COMPREHENSIVE METABOLIC PANEL (CC13)
ALBUMIN: 3.1 g/dL — AB (ref 3.5–5.0)
ALK PHOS: 145 U/L (ref 40–150)
ALT: 14 U/L (ref 0–55)
AST: 17 U/L (ref 5–34)
Anion Gap: 6 mEq/L (ref 3–11)
BUN: 12.9 mg/dL (ref 7.0–26.0)
CALCIUM: 8.4 mg/dL (ref 8.4–10.4)
CO2: 23 mEq/L (ref 22–29)
CREATININE: 0.7 mg/dL (ref 0.7–1.3)
Chloride: 111 mEq/L — ABNORMAL HIGH (ref 98–109)
EGFR: >90 mL/min/{1.73_m2} (ref >90)
Glucose: 114 mg/dl (ref 70–140)
Potassium: 3.5 mEq/L (ref 3.5–5.1)
SODIUM: 140 meq/L (ref 136–145)
Total Bilirubin: 0.64 mg/dL (ref 0.20–1.20)
Total Protein: 5.5 g/dL — ABNORMAL LOW (ref 6.4–8.3)

## 2015-05-05 LAB — CBC WITH DIFFERENTIAL/PLATELET
BASO%: 0.3 % (ref 0.0–2.0)
Basophils Absolute: 0 10*3/uL (ref 0.0–0.1)
EOS%: 4.2 % (ref 0.0–7.0)
Eosinophils Absolute: 0.2 10*3/uL (ref 0.0–0.5)
HEMATOCRIT: 35.4 % — AB (ref 38.4–49.9)
HEMOGLOBIN: 12.8 g/dL — AB (ref 13.0–17.1)
LYMPH%: 14.1 % (ref 14.0–49.0)
MCH: 31.2 pg (ref 27.2–33.4)
MCHC: 36.2 g/dL — ABNORMAL HIGH (ref 32.0–36.0)
MCV: 86.3 fL (ref 79.3–98.0)
MONO#: 0.4 10*3/uL (ref 0.1–0.9)
MONO%: 10.5 % (ref 0.0–14.0)
NEUT%: 70.9 % (ref 39.0–75.0)
NEUTROS ABS: 2.7 10*3/uL (ref 1.5–6.5)
NRBC: 0 % (ref 0–0)
Platelets: 208 10*3/uL (ref 140–400)
RBC: 4.1 10*6/uL — ABNORMAL LOW (ref 4.20–5.82)
RDW: 13.9 % (ref 11.0–14.6)
WBC: 3.8 10*3/uL — ABNORMAL LOW (ref 4.0–10.3)
lymph#: 0.5 10*3/uL — ABNORMAL LOW (ref 0.9–3.3)

## 2015-05-05 NOTE — Telephone Encounter (Signed)
Gave and printed appt sched and avs for pt for Sept °

## 2015-05-05 NOTE — Progress Notes (Signed)
Hematology and Oncology Follow Up Visit  Chad George [id number] 05-Oct-1970 44 y.o. 05/05/2015 4:31 PM [name], [name], MDJegede, [name], MD   Principle Diagnosis: 44 year old gentleman with stage IV adenocarcinoma of the lung diagnosed in March of 2015. He presented with left upper lobe lung mass measuring 5.4 x 4.7 x 3.7 cm and bilateral pulmonary nodules. Pathology indicating adenocarcinoma of a lung primary. 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  Prior Therapy:  Tarceva 150 mg daily started in March of 2015. Placed on hold 12/14/13 due to rash. This was resumed again on 12/24/2013. Discontinued 10/05/2014 secondary to disease progression within the chest as well as multiple brain metastasis. Whole brain irradiation under the care of Dr. Lisbeth Renshaw expected to be completed on 11/08/2014 Status post palliative radiation therapy to the spine completed in July 2016.  Current therapy:   Gilotrif (afatinib) 40 mg by mouth daily Therapy beginning approximately 10/05/2014. Therapy interrupted on 11/23/2014. His therapy was resumed at 20 mg daily on 12/01/2014.   Interim History:  Mr. Vanwart presents today for routine followup by himself. Since the last visit, he reports no complaints. He continues to tolerate Afatinib without any new complications. He does report erythema and induration at his fingertips but no rash. He continues to ambulate without any difficulty. He was trying to escalate his exercise but his exercise tolerance is still limited at this time. He does not report any numbness or tingling. Does not report any loss of bowel or bladder function.  He underwent a liver biopsy and have tolerated it well. He does not report any pain or discomfort on the biopsy site.  He is not reporting any headaches or blurry vision does not report any alteration of mental status psychiatric issues of depression. Is not reporting any chest pain or  palpitation. Does not report any nausea or vomiting abdominal pain. Has not reported any hematochezia or melena. Has not reported any frequency urgency or hesitancy. Does not report any lymphadenopathy or petechiae. He does not report any skeletal pain.he does report paronychia affecting his toes and fingernails.  Remainder of his review of system is unremarkable.  Medications: I have reviewed the patient's current medications.  Current Outpatient Prescriptions  Medication Sig Dispense Refill  . acetaminophen (TYLENOL) 325 MG tablet Take 650 mg by mouth every 6 (six) hours as needed for mild pain.     Marland Kitchen afatinib dimaleate (GILOTRIF) 20 MG tablet Take 1 tablet (20 mg total) by mouth daily before breakfast. 30 tablet 0  . albuterol (PROVENTIL HFA;VENTOLIN HFA) 108 (90 BASE) MCG/ACT inhaler Inhale 2 puffs into the lungs every 6 (six) hours as needed for wheezing or shortness of breath. 1 Inhaler 1  . benzonatate (TESSALON) 100 MG capsule Take 1 to 2 capsules by mouth every 8 hours as needed for cough 45 capsule 2  . chlorpheniramine-HYDROcodone (TUSSIONEX PENNKINETIC ER) 10-8 MG/5ML LQCR Take 5 mLs by mouth every 12 (twelve) hours as needed for cough. 1 Bottle 0  . diphenoxylate-atropine (LOMOTIL) 2.5-0.025 MG per tablet Take 2 tablets by mouth 4 (four) times daily as needed for diarrhea or loose stools. 30 tablet 1  . fluticasone (FLONASE) 50 MCG/ACT nasal spray Place 2 sprays into both nostrils daily. 16 g 6  . guaiFENesin (MUCINEX) 600 MG 12 hr tablet Take 600 mg by mouth 2 (two) times daily as needed for cough or to loosen phlegm.     . hydrOXYzine (ATARAX/VISTARIL) 10 MG tablet  Take 1 tablet (10 mg total) by mouth 3 (three) times daily as needed for itching. 30 tablet 0  . Lactulose 20 GM/30ML SOLN Take 30 cc by mouth every 4 hours as needed for constipation. 236 mL 1  . loratadine (CLARITIN) 10 MG tablet Take 1 tablet (10 mg total) by mouth daily. (Patient taking differently: Take 10 mg by mouth  daily as needed for allergies. ) 30 tablet 11  . methylPREDNISolone (MEDROL DOSEPAK) 4 MG TBPK tablet Take as directed with food 21 tablet 0  . metoCLOPramide (REGLAN) 10 MG tablet Take 1 tablet (10 mg total) by mouth every 6 (six) hours as needed for nausea or vomiting (nausea/headache). 20 tablet 0  . ondansetron (ZOFRAN ODT) 8 MG disintegrating tablet Take 1 tablet (8 mg total) by mouth every 12 (twelve) hours as needed for nausea or vomiting. 30 tablet 0  . ondansetron (ZOFRAN) 8 MG tablet Take 1 tablet (8 mg total) by mouth every 12 (twelve) hours as needed for nausea or vomiting. 30 tablet 0  . oxyCODONE (OXY IR/ROXICODONE) 5 MG immediate release tablet Take 1-2 tablets (5-10 mg total) by mouth every 6 (six) hours as needed for severe pain. 30 tablet 0  . doxycycline (VIBRAMYCIN) 100 MG capsule Take 1 capsule (100 mg total) by mouth 2 (two) times daily. (Patient not taking: Reported on 04/14/2015) 60 capsule 1   No current facility-administered medications for this visit.     Allergies:  Allergies  Allergen Reactions  . Amoxicillin-Pot Clavulanate Other (See Comments)    UNKNOWN  . Penicillins Rash    Past Medical History, Surgical history, Social history, and Family History were reviewed and updated.    Physical Exam: Blood pressure 100/57, pulse 69, temperature 98.1 F (36.7 C), temperature source Oral, resp. rate 18, height $RemoveBe'5\' 4"'qdPvBzoqL$  (1.626 m), weight 140 lb 12.8 oz (63.866 kg), SpO2 100 %. ECOG: 1 General appearance: alert and cooperative chronically ill-appearing but without distress. Head: Normocephalic, no masses or lesions.  Neck: no adenopathy, no thyroid masses. Lymph nodes: Cervical, supraclavicular, and axillary nodes normal. Heart:regular rate and rhythm, S1, S2 normal, no murmur, click, rub or gallop Lung:bilateral basilar rales heard. No wheezing or dullness to percussion. Abdomen: soft, non-tender, without masses or organomegaly EXT:no erythema, induration, or  nodules Skin:  No rash noted at this time. Back: point tender in the lumbar region of the spine. No lesions on inspection. Neurological exam: No deficits in motor, sensory or deep tendon reflexes.  Lab Results: Lab Results  Component Value Date   WBC 3.8* 05/05/2015   HGB 12.8* 05/05/2015   HCT 35.4* 05/05/2015   MCV 86.3 05/05/2015   PLT 208 05/05/2015     Chemistry      Component Value Date/Time   NA 140 05/05/2015 1542   NA 136 11/23/2014 1844   K 3.5 05/05/2015 1542   K 3.2* 11/23/2014 1844   CL 101 11/23/2014 1844   CO2 23 05/05/2015 1542   CO2 22 11/18/2013 0457   BUN 12.9 05/05/2015 1542   BUN 22 11/23/2014 1844   CREATININE 0.7 05/05/2015 1542   CREATININE 0.90 11/23/2014 1844   CREATININE 0.69 08/16/2013 1727      Component Value Date/Time   CALCIUM 8.4 05/05/2015 1542   CALCIUM 9.1 11/18/2013 0457   ALKPHOS 145 05/05/2015 1542   ALKPHOS 124* 11/16/2013 0630   AST 17 05/05/2015 1542   AST 18 11/16/2013 0630   ALT 14 05/05/2015 1542   ALT 17 11/16/2013 0630  BILITOT 0.64 05/05/2015 1542   BILITOT 0.6 11/16/2013 0630                                             44 year-old gentleman with the following issues:  1. Stage IV adenocarcinoma of the lung with a left lung mass and bilateral pulmonary nodules. His tumor is EGFR mutated but the ALK mutation was not detected.    CT scan on 10/04/2014  showed progression of disease on Tarceva..His parenchymal metastasis as well as lymph node metastasis have increased. After 6 weeks of Afatinib, he experienced a number of side effects that required treatment interruption.   He is currently on Gilotrif  at 20 mg daily and have tolerated it well.   CT scan of on 04/05/2015  Showed progression of his disease. Although there is clear progression by radiographic evidence, he is clinically continue to benefit from this medication. I have requested a [facility] testing on his biopsy that was obtained on  04/18/2015. If there is a treatable mutation, we will use salvage therapy accordingly. If there is no treatable mutation, then we will consider systemic IV chemotherapy.   2. Acneiform rash: Seems to  Have resolved at this time.  3. Paronychia-likely related to Gilotrif therapy. It appears to have worsened in the last month and I asked him to go back on doxycycline for 2 more weeks to see if there is any benefit.  4. Constipation: This has improved since the last visit.  5. Brain metastasis: He is status post radiation therapy. MRI of the brain on 02/21/2015 showed excellent response to radiation.  6. Back pain: MRI on 02/09/2015  did not show any evidence of epidural compression.  He is status post palliative radiation therapy with improvement in his pain.  7. Follow up:  In 4 weeks.  [facility], MD 8/26/20164:31 PM

## 2015-05-10 ENCOUNTER — Ambulatory Visit: Payer: Self-pay | Attending: Internal Medicine

## 2015-05-11 ENCOUNTER — Other Ambulatory Visit: Payer: Self-pay | Admitting: *Deleted

## 2015-05-11 DIAGNOSIS — C7931 Secondary malignant neoplasm of brain: Secondary | ICD-10-CM

## 2015-05-11 DIAGNOSIS — C3491 Malignant neoplasm of unspecified part of right bronchus or lung: Secondary | ICD-10-CM

## 2015-05-11 DIAGNOSIS — C801 Malignant (primary) neoplasm, unspecified: Secondary | ICD-10-CM

## 2015-05-11 MED ORDER — AFATINIB DIMALEATE 20 MG PO TABS: 20.0000 mg | ORAL_TABLET | Freq: Every day | ORAL | Status: DC

## 2015-05-12 MED ORDER — ALBUTEROL SULFATE HFA 108 (90 BASE) MCG/ACT IN AERS: 2.0000 | INHALATION_SPRAY | Freq: Four times a day (QID) | RESPIRATORY_TRACT | Status: AC | PRN

## 2015-05-16 ENCOUNTER — Encounter: Payer: Self-pay | Admitting: Oncology

## 2015-05-16 NOTE — Progress Notes (Signed)
Per [facility] gilotrif tablets are to ship to patient on 05/16/15 case# [id number]

## 2015-05-19 ENCOUNTER — Ambulatory Visit (HOSPITAL_COMMUNITY)
Admission: RE | Admit: 2015-05-19 | Discharge: 2015-05-19 | Disposition: A | Discharge status: Home / Self Care | Payer: Self-pay | Source: Ambulatory Visit | Attending: Radiation Oncology | Admitting: Radiation Oncology

## 2015-05-19 DIAGNOSIS — C7931 Secondary malignant neoplasm of brain: Secondary | ICD-10-CM | POA: Insufficient documentation

## 2015-05-19 DIAGNOSIS — C7951 Secondary malignant neoplasm of bone: Secondary | ICD-10-CM | POA: Insufficient documentation

## 2015-05-19 MED ORDER — GADOBENATE DIMEGLUMINE 529 MG/ML IV SOLN
13.0000 mL | Freq: Once | INTRAVENOUS | Status: AC | PRN
  Administered 2015-05-19: 13 mL via INTRAVENOUS

## 2015-05-25 ENCOUNTER — Encounter (HOSPITAL_COMMUNITY): Payer: Self-pay

## 2015-05-25 ENCOUNTER — Ambulatory Visit: Admission: RE | Admit: 2015-05-25 | Payer: Self-pay | Source: Ambulatory Visit | Admitting: Radiation Oncology

## 2015-06-02 ENCOUNTER — Inpatient Hospital Stay: Admission: RE | Admit: 2015-06-02 | Payer: Self-pay | Source: Ambulatory Visit | Admitting: Radiation Oncology

## 2015-06-02 ENCOUNTER — Ambulatory Visit
Admission: RE | Admit: 2015-06-02 | Discharge: 2015-06-02 | Disposition: A | Discharge status: Home / Self Care | Payer: Self-pay | Source: Ambulatory Visit | Attending: Radiation Oncology | Admitting: Radiation Oncology

## 2015-06-02 ENCOUNTER — Encounter: Payer: Self-pay | Admitting: Radiation Oncology

## 2015-06-02 VITALS — BP 87/59 | HR 66 | Temp 98.2°F | Resp 20 | Ht 66.0 in | Wt 136.6 lb

## 2015-06-02 DIAGNOSIS — C7951 Secondary malignant neoplasm of bone: Secondary | ICD-10-CM

## 2015-06-02 MED ORDER — PENTOXIFYLLINE ER 400 MG PO TBCR: 400.0000 mg | EXTENDED_RELEASE_TABLET | Freq: Two times a day (BID) | ORAL | Status: AC

## 2015-06-02 NOTE — Progress Notes (Signed)
Follow up s/p  Rad txs 03/14/15-7/18/146 lumbar/pelvis region  And prior to that brain radiation patient having headaches says it is a head cold with chest congestion, takes tylenol that helps the headache, still having diarrhea takes imodium  Daily, on Gliotrif '20mg'$  daily, poor appetite Also c/o all muscles aching arms,legs, pain after voiding  ' MRI done on 05/19/15 results are in,  BP 91/56 mmHg  Pulse 88  Temp(Src) 98.2 F (36.8 C) (Oral)  Resp 20  Ht '5\' 6"'$  (1.676 m)  Wt 136 lb 9.6 oz (61.961 kg)  BMI 22.06 kg/m2  SpO2 90% Sitting  Wt Readings from Last 3 Encounters:  06/02/15 136 lb 9.6 oz (61.961 kg)  05/19/15 140 lb (63.504 kg)  05/05/15 140 lb 12.8 oz (63.866 kg)   BP 87/59 mmHg  Pulse 66  Temp(Src) 98.2 F (36.8 C) (Oral)  Resp 20  Ht '5\' 6"'$  (1.676 m)  Wt 136 lb 9.6 oz (61.961 kg)  BMI 22.06 kg/m2  SpO2 90% Standing

## 2015-06-02 NOTE — Progress Notes (Signed)
Radiation Oncology         [phone] ________________________________  Name: Chad George MRN: [id number]  Date: 06/02/2015  DOB: 08-24-1971  Follow-Up Visit Note  CC: Chad Chessman, MD  Chad Portela, MD  Diagnosis:   Metastatic non-small cell lung cancer   Narrative:  The patient returns today for routine follow-up.   Follow up s/p  Rad txs 03/14/15-7/18/146 lumbar/pelvis region  And prior to that brain radiation patient having headaches says it is a head cold with chest congestion, takes tylenol that helps the headache, still having diarrhea takes imodium  Daily, on Gliotrif '20mg'$  daily, poor appetite Also c/o all muscles aching arms,legs, pain after voiding  ' MRI done on 05/19/15 results are in,  BP 87/59 mmHg  Pulse 66  Temp(Src) 98.2 F (36.8 C) (Oral)  Resp 20  Ht '5\' 6"'$  (1.676 m)  Wt 136 lb 9.6 oz (61.961 kg)  BMI 22.06 kg/m2  SpO2 90% Sitting  Wt Readings from Last 3 Encounters:  06/02/15 136 lb 9.6 oz (61.961 kg)  05/19/15 140 lb (63.504 kg)  05/05/15 140 lb 12.8 oz (63.866 kg)   BP 87/59 mmHg  Pulse 66  Temp(Src) 98.2 F (36.8 C) (Oral)  Resp 20  Ht '5\' 6"'$  (1.676 m)  Wt 136 lb 9.6 oz (61.961 kg)  BMI 22.06 kg/m2  SpO2 90% Standing                              ALLERGIES:  is allergic to amoxicillin-pot clavulanate and penicillins.  Meds: Current Outpatient Prescriptions  Medication Sig Dispense Refill  . acetaminophen (TYLENOL) 325 MG tablet Take 650 mg by mouth every 6 (six) hours as needed for mild pain.     Marland Kitchen afatinib dimaleate (GILOTRIF) 20 MG tablet Take 1 tablet (20 mg total) by mouth daily before breakfast. 30 tablet 0  . albuterol (PROVENTIL HFA;VENTOLIN HFA) 108 (90 BASE) MCG/ACT inhaler Inhale 2 puffs into the lungs every 6 (six) hours as needed for wheezing or shortness of breath. 1 Inhaler 1  . benzonatate (TESSALON) 100 MG capsule Take 1 to 2 capsules by mouth every 8 hours as needed for cough 45 capsule 2  . chlorpheniramine-HYDROcodone  (TUSSIONEX PENNKINETIC ER) 10-8 MG/5ML LQCR Take 5 mLs by mouth every 12 (twelve) hours as needed for cough. 1 Bottle 0  . diphenoxylate-atropine (LOMOTIL) 2.5-0.025 MG per tablet Take 2 tablets by mouth 4 (four) times daily as needed for diarrhea or loose stools. 30 tablet 1  . fluticasone (FLONASE) 50 MCG/ACT nasal spray Place 2 sprays into both nostrils daily. 16 g 6  . guaiFENesin (MUCINEX) 600 MG 12 hr tablet Take 600 mg by mouth 2 (two) times daily as needed for cough or to loosen phlegm.     . loratadine (CLARITIN) 10 MG tablet Take 1 tablet (10 mg total) by mouth daily. (Patient taking differently: Take 10 mg by mouth daily as needed for allergies. ) 30 tablet 11  . metoCLOPramide (REGLAN) 10 MG tablet Take 1 tablet (10 mg total) by mouth every 6 (six) hours as needed for nausea or vomiting (nausea/headache). 20 tablet 0  . oxyCODONE (OXY IR/ROXICODONE) 5 MG immediate release tablet Take 1-2 tablets (5-10 mg total) by mouth every 6 (six) hours as needed for severe pain. 30 tablet 0  . doxycycline (VIBRAMYCIN) 100 MG capsule Take 1 capsule (100 mg total) by mouth 2 (two) times daily. (Patient not taking: Reported on  04/14/2015) 60 capsule 1  . hydrOXYzine (ATARAX/VISTARIL) 10 MG tablet Take 1 tablet (10 mg total) by mouth 3 (three) times daily as needed for itching. (Patient not taking: Reported on 06/02/2015) 30 tablet 0  . Lactulose 20 GM/30ML SOLN Take 30 cc by mouth every 4 hours as needed for constipation. (Patient not taking: Reported on 06/02/2015) 236 mL 1  . methylPREDNISolone (MEDROL DOSEPAK) 4 MG TBPK tablet Take as directed with food (Patient not taking: Reported on 06/02/2015) 21 tablet 0  . ondansetron (ZOFRAN ODT) 8 MG disintegrating tablet Take 1 tablet (8 mg total) by mouth every 12 (twelve) hours as needed for nausea or vomiting. (Patient not taking: Reported on 06/02/2015) 30 tablet 0  . ondansetron (ZOFRAN) 8 MG tablet Take 1 tablet (8 mg total) by mouth every 12 (twelve) hours as  needed for nausea or vomiting. (Patient not taking: Reported on 06/02/2015) 30 tablet 0  . pentoxifylline (TRENTAL) 400 MG CR tablet Take 1 tablet (400 mg total) by mouth 2 (two) times daily. Take 400 units vitamin E twice a day with this medication. 60 tablet 3   No current facility-administered medications for this encounter.    Physical Findings: The patient is in no acute distress. Patient is alert and oriented.  height is '5\' 6"'$  (1.676 m) and weight is 136 lb 9.6 oz (61.961 kg). His oral temperature is 98.2 F (36.8 C). His blood pressure is 87/59 and his pulse is 66. His respiration is 20 and oxygen saturation is 90%. .     Lab Findings: Lab Results  Component Value Date   WBC 3.8* 05/05/2015   HGB 12.8* 05/05/2015   HCT 35.4* 05/05/2015   MCV 86.3 05/05/2015   PLT 208 05/05/2015     Radiographic Findings: Mr Jeri Cos Wo Contrast  05/19/2015   CLINICAL DATA:  44 year old male Metastatic adenocarcinoma of the lung Radiation: 10/19/14-11/08/14, whole brain radiation treatment. Restaging. Subsequent encounter.  EXAM: MRI HEAD WITHOUT AND WITH CONTRAST  TECHNIQUE: Multiplanar, multiecho pulse sequences of the brain and surrounding structures were obtained without and with intravenous contrast.  CONTRAST:  33m MULTIHANCE GADOBENATE DIMEGLUMINE 529 MG/ML IV SOLN  COMPARISON:  02/21/2015 and earlier.  FINDINGS: Most of the numerous previously treated brain metastases are stable or regressed compared to June, and except below all of the large metastases present on the February 2016 study have regressed.  However, there has been interval progression of the treated left corona radiata metastasis today on series 12, image 39. This demonstrates more thickened and nodular peripheral enhancement. It remains smaller than on the presentation study, but today measures up to 16 mm (13 mm in June). Hemosiderin is again noted at the lesion. There is also new diffusion restriction within the lesion (series 4,  image 35). Surrounding T2 and FLAIR hyperintensity has progressed (series 7, images 15 and 16). Minimal regional mass effect.  Mild T2 and FLAIR hyperintensity also is increased in the medial superior frontal gyrus on series 8, image 18, but there is no corresponding enhancing metastasis at that site. No significant mass effect. No other confluent cerebral edema. Scattered mostly punctate hemosiderin elsewhere in both cerebral and cerebellar hemispheres is compatible with treated disease. Chronic blood products are otherwise most conspicuous in the left temporal lobe and left cerebellar treated metastases.  There are 2 punctate enhancing metastases identified today which were not definitely present on prior studies. These include a punctate lesion in the anterior right motor strip on series 12, image 47, and  a punctate lesion in the posterior left cerebellar hemisphere on series 12, image 16. Neither of these has significant edema, or mass effect.  Other identified enhancing metastases are stable or decreased. All lesions are annotated on series 12. No meningeal thickening identified.  No acute intracranial hemorrhage. No ventriculomegaly. No midline shift. Patent basilar cisterns. No restricted diffusion or evidence of acute infarction. Major intracranial vascular flow voids are stable. Negative pituitary. Negative cervicomedullary junction and visualized cervical spinal cord. Regressed mastoid effusions. Stable mild paranasal sinus mucosal thickening. Orbit and scalp soft tissues appear negative.  Treated metastasis at the base of the clivus suspected and stable. Visualized bone marrow signal elsewhere within normal limits.  IMPRESSION: 1. Since June progressed size, enhancement, and edema surrounding the left corona radiata lesion (series 12, image 39). In light of whole brain radiation history, this finding is more compatible with true progression of disease than treatment affect. 2. Two punctate possibly brand  new brain metastases in the anterior right motor strip and posterior left cerebellum, annotated with double arrows on series 12. 3. Numerous additional brain metastases remain stable or regressed since June. 4. Nonspecific focus of new FLAIR hyperintensity in the medial right motor strip without associated enhancement (series 8, image 18), attention directed on followup.   Electronically Signed   By: Genevie Ann M.D.   On: 05/19/2015 16:32    Impression:    The patient clinically is doing reasonably well and is stable. He has had some occasional headaches over the last 2 months that these have responded well to Tylenol He states they have not become more frequent or severe. The patient had a recent MRI scan of the brain which has been reviewed and brain conference. Overall this looked good at 1 lesion was somewhat larger.The recommendation was to repeat his brain MRI scan 2 months later.  Plan:  We will proceed with a repeat brain MRI scan in approximately 7 weeks.  I have called in a prescription for Trental/vitamin E for the patient I have not prescribed Decadrongiven no significant increase in headaches or other symptoms The patient is to let us know if he develops further symptoms and this could be reevaluated if necessary.   Jodelle Gross, M.D., Ph.D.

## 2015-06-05 ENCOUNTER — Other Ambulatory Visit: Payer: Self-pay | Admitting: Radiation Therapy

## 2015-06-05 DIAGNOSIS — C7931 Secondary malignant neoplasm of brain: Secondary | ICD-10-CM

## 2015-06-07 ENCOUNTER — Ambulatory Visit (HOSPITAL_BASED_OUTPATIENT_CLINIC_OR_DEPARTMENT_OTHER): Payer: Self-pay | Admitting: Oncology

## 2015-06-07 ENCOUNTER — Other Ambulatory Visit: Payer: Self-pay | Admitting: *Deleted

## 2015-06-07 ENCOUNTER — Encounter: Payer: Self-pay | Admitting: *Deleted

## 2015-06-07 ENCOUNTER — Other Ambulatory Visit (HOSPITAL_BASED_OUTPATIENT_CLINIC_OR_DEPARTMENT_OTHER): Payer: Self-pay

## 2015-06-07 ENCOUNTER — Ambulatory Visit (HOSPITAL_COMMUNITY)
Admission: RE | Admit: 2015-06-07 | Discharge: 2015-06-07 | Disposition: A | Discharge status: Home / Self Care | Payer: Self-pay | Source: Ambulatory Visit | Attending: Internal Medicine | Admitting: Internal Medicine

## 2015-06-07 ENCOUNTER — Telehealth: Payer: Self-pay | Admitting: Oncology

## 2015-06-07 ENCOUNTER — Other Ambulatory Visit: Payer: Self-pay | Admitting: Oncology

## 2015-06-07 ENCOUNTER — Encounter: Payer: Self-pay | Admitting: Oncology

## 2015-06-07 VITALS — BP 96/54 | HR 106 | Temp 98.8°F | Resp 18 | Ht 66.0 in | Wt 138.5 lb

## 2015-06-07 DIAGNOSIS — I82432 Acute embolism and thrombosis of left popliteal vein: Secondary | ICD-10-CM | POA: Insufficient documentation

## 2015-06-07 DIAGNOSIS — C801 Malignant (primary) neoplasm, unspecified: Secondary | ICD-10-CM

## 2015-06-07 DIAGNOSIS — M79604 Pain in right leg: Secondary | ICD-10-CM | POA: Insufficient documentation

## 2015-06-07 DIAGNOSIS — M79662 Pain in left lower leg: Secondary | ICD-10-CM

## 2015-06-07 DIAGNOSIS — C3400 Malignant neoplasm of unspecified main bronchus: Secondary | ICD-10-CM

## 2015-06-07 DIAGNOSIS — C3412 Malignant neoplasm of upper lobe, left bronchus or lung: Secondary | ICD-10-CM

## 2015-06-07 DIAGNOSIS — Z85841 Personal history of malignant neoplasm of brain: Secondary | ICD-10-CM | POA: Insufficient documentation

## 2015-06-07 DIAGNOSIS — M7989 Other specified soft tissue disorders: Secondary | ICD-10-CM

## 2015-06-07 DIAGNOSIS — C7989 Secondary malignant neoplasm of other specified sites: Secondary | ICD-10-CM

## 2015-06-07 DIAGNOSIS — M79605 Pain in left leg: Secondary | ICD-10-CM | POA: Insufficient documentation

## 2015-06-07 DIAGNOSIS — C7931 Secondary malignant neoplasm of brain: Secondary | ICD-10-CM

## 2015-06-07 LAB — COMPREHENSIVE METABOLIC PANEL (CC13)
ALK PHOS: 131 U/L (ref 40–150)
ALT: 14 U/L (ref 0–55)
AST: 25 U/L (ref 5–34)
Albumin: 2.3 g/dL — ABNORMAL LOW (ref 3.5–5.0)
Anion Gap: 7 mEq/L (ref 3–11)
BUN: 11.4 mg/dL (ref 7.0–26.0)
CO2: 24 meq/L (ref 22–29)
Calcium: 8.3 mg/dL — ABNORMAL LOW (ref 8.4–10.4)
Chloride: 105 mEq/L (ref 98–109)
Creatinine: 0.7 mg/dL (ref 0.7–1.3)
Glucose: 99 mg/dl (ref 70–140)
POTASSIUM: 3.9 meq/L (ref 3.5–5.1)
Sodium: 136 mEq/L (ref 136–145)
Total Bilirubin: 0.8 mg/dL (ref 0.20–1.20)
Total Protein: 5.1 g/dL — ABNORMAL LOW (ref 6.4–8.3)

## 2015-06-07 LAB — CBC WITH DIFFERENTIAL/PLATELET
BASO%: 0.5 % (ref 0.0–2.0)
Basophils Absolute: 0 10*3/uL (ref 0.0–0.1)
EOS ABS: 0.2 10*3/uL (ref 0.0–0.5)
EOS%: 2.7 % (ref 0.0–7.0)
HCT: 37.7 % — ABNORMAL LOW (ref 38.4–49.9)
HGB: 13.1 g/dL (ref 13.0–17.1)
LYMPH%: 5 % — AB (ref 14.0–49.0)
MCH: 31 pg (ref 27.2–33.4)
MCHC: 34.6 g/dL (ref 32.0–36.0)
MCV: 89.5 fL (ref 79.3–98.0)
MONO#: 0.6 10*3/uL (ref 0.1–0.9)
MONO%: 7.7 % (ref 0.0–14.0)
NEUT#: 6.6 10*3/uL — ABNORMAL HIGH (ref 1.5–6.5)
NEUT%: 84.1 % — ABNORMAL HIGH (ref 39.0–75.0)
Platelets: 211 10*3/uL (ref 140–400)
RBC: 4.22 10*6/uL (ref 4.20–5.82)
RDW: 13.6 % (ref 11.0–14.6)
WBC: 7.8 10*3/uL (ref 4.0–10.3)
lymph#: 0.4 10*3/uL — ABNORMAL LOW (ref 0.9–3.3)

## 2015-06-07 MED ORDER — MEGESTROL ACETATE 400 MG/10ML PO SUSP: 400.0000 mg | Freq: Two times a day (BID) | ORAL | Status: AC

## 2015-06-07 MED ORDER — OSIMERTINIB MESYLATE 80 MG PO TABS: 80.0000 mg | ORAL_TABLET | Freq: Every day | ORAL | Status: AC

## 2015-06-07 MED ORDER — ENOXAPARIN SODIUM 150 MG/ML ~~LOC~~ SOLN: 1.5000 mg/kg | SUBCUTANEOUS | Status: AC

## 2015-06-07 MED ORDER — OXYCODONE HCL 5 MG PO TABS: 5.0000 mg | ORAL_TABLET | Freq: Four times a day (QID) | ORAL | Status: AC | PRN

## 2015-06-07 NOTE — Progress Notes (Signed)
I faxed biologics req for tagrisso.

## 2015-06-07 NOTE — Progress Notes (Signed)
Patient saturation on Room Aur at Rest = 91% Patient saturation on Room Air while Ambulating = 76% Patient Saturation on 2 liters of oxygen while ambulating = 80%.

## 2015-06-07 NOTE — Progress Notes (Signed)
Prescription for Tagrisso taken [facility]

## 2015-06-07 NOTE — Progress Notes (Signed)
Hematology and Oncology Follow Up Visit  Chad George [id number] 04/26/1971 43 y.o. 06/07/2015 9:56 AM [name], OLUGBEMIGA, MDJegede, Olugbemiga E, MD   Principle Diagnosis: 43-year-old gentleman with stage IV adenocarcinoma of the lung diagnosed in March of 2015. He presented with left upper lobe lung mass measuring 5.4 x 4.7 x 3.7 cm and bilateral pulmonary nodules. Pathology indicating adenocarcinoma of a lung primary. 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  Prior Therapy:  Tarceva 150 mg daily started in March of 2015. Placed on hold 12/14/13 due to rash. This was resumed again on 12/24/2013. Discontinued 10/05/2014 secondary to disease progression within the chest as well as multiple brain metastasis. Whole brain irradiation under the care of Dr. Moody expected to be completed on 11/08/2014 Status post palliative radiation therapy to the spine completed in July 2016. Gilotrif (afatinib) 40 mg by mouth daily Therapy beginning approximately 10/05/2014. Therapy interrupted on 11/23/2014. His therapy was resumed at 20 mg daily on 12/01/2014.  Current therapy:  He is in the process of switching to Tagrisso 80 mg daily in the near future.    Interim History:  Chad George presents today for routine followup with his wife. Since the last visit, he reports more complain and decline. He has reported more respiratory symptoms including dyspnea, cough and rib cage pain. He hadt been using oxycodone infrequently. He also have used inhalers as well as decongestion medication with very little success. His performance status have declined as well. His appetite have been relatively poor and have lost a pound and a half in the last month.    He continues to ambulate without any difficulty but limited with his dyspnea. He is able to drive but  struggling to perform certain activities of daily living. He does not report any hemoptysis or angina. He does report left lower extremity edema more than the right. He also reported some calf tenderness.  He is not reporting any headaches or blurry vision does not report any alteration of mental status. He is not reporting any chest pain or palpitation. Does not report any nausea or vomiting abdominal pain. Has not reported any hematochezia or melena. Has not reported any frequency urgency or hesitancy. Does not report any lymphadenopathy or petechiae. He does not report any other skeletal pain including lumbar, thoracic or hip pain. Remainder of his review of system is unremarkable.  Medications: I have reviewed the patient's current medications.  Current Outpatient Prescriptions  Medication Sig Dispense Refill  . acetaminophen (TYLENOL) 325 MG tablet Take 650 mg by mouth every 6 (six) hours as needed for mild pain.     . albuterol (PROVENTIL HFA;VENTOLIN HFA) 108 (90 BASE) MCG/ACT inhaler Inhale 2 puffs into the lungs every 6 (six) hours as needed for wheezing or shortness of breath. 1 Inhaler 1  . benzonatate (TESSALON) 100 MG capsule Take 1 to 2 capsules by mouth every 8 hours as needed for cough 45 capsule 2  . chlorpheniramine-HYDROcodone (TUSSIONEX PENNKINETIC ER) 10-8 MG/5ML LQCR Take 5 mLs by mouth every 12 (twelve) hours as needed for cough. 1 Bottle 0  . diphenoxylate-atropine (LOMOTIL) 2.5-0.025 MG per tablet Take 2 tablets by mouth 4 (four) times daily as needed for diarrhea or loose stools. 30 tablet 1  . doxycycline (VIBRAMYCIN) 100 MG capsule Take 1 capsule (100 mg total) by mouth 2 (  two) times daily. 60 capsule 1  . fluticasone (FLONASE) 50 MCG/ACT nasal spray Place 2 sprays into both nostrils daily. 16 g 6  . guaiFENesin (MUCINEX) 600 MG 12 hr tablet Take 600 mg by mouth 2 (two) times daily as needed for cough or to loosen phlegm.     . hydrOXYzine (ATARAX/VISTARIL) 10 MG tablet  Take 1 tablet (10 mg total) by mouth 3 (three) times daily as needed for itching. 30 tablet 0  . Lactulose 20 GM/30ML SOLN Take 30 cc by mouth every 4 hours as needed for constipation. 236 mL 1  . loratadine (CLARITIN) 10 MG tablet Take 1 tablet (10 mg total) by mouth daily. (Patient taking differently: Take 10 mg by mouth daily as needed for allergies. ) 30 tablet 11  . metoCLOPramide (REGLAN) 10 MG tablet Take 1 tablet (10 mg total) by mouth every 6 (six) hours as needed for nausea or vomiting (nausea/headache). 20 tablet 0  . oxyCODONE (OXY IR/ROXICODONE) 5 MG immediate release tablet Take 1-2 tablets (5-10 mg total) by mouth every 6 (six) hours as needed for severe pain. 30 tablet 0  . megestrol (MEGACE) 400 MG/10ML suspension Take 10 mLs (400 mg total) by mouth 2 (two) times daily. 240 mL 0  . osimertinib mesylate (TAGRISSO) 80 MG tablet Take 1 tablet (80 mg total) by mouth daily. 30 tablet 0  . pentoxifylline (TRENTAL) 400 MG CR tablet Take 1 tablet (400 mg total) by mouth 2 (two) times daily. Take 400 units vitamin E twice a day with this medication. (Patient not taking: Reported on 06/07/2015) 60 tablet 3   No current facility-administered medications for this visit.     Allergies:  Allergies  Allergen Reactions  . Amoxicillin-Pot Clavulanate Other (See Comments)    UNKNOWN  . Penicillins Rash    Past Medical History, Surgical history, Social history, and Family History were reviewed and updated.    Physical Exam: Blood pressure 96/54, pulse 106, temperature 98.8 F (37.1 C), temperature source Oral, resp. rate 18, height 5' 6" (1.676 m), weight 138 lb 8 oz (62.823 kg), SpO2 91 %. ECOG: 1 General appearance: alert and cooperative chronically ill-appearing slightly dyspneic. Head: Normocephalic, no masses or lesions. No oral ulcers or lesions. Neck: no adenopathy, no thyroid masses. Lymph nodes: Cervical, supraclavicular, and axillary nodes normal. Heart:regular rate and  rhythm, S1, S2 normal, no murmur, click, rub or gallop tachycardic. Lung:bilateral basilar rales heard. Scattered wheezes noted bilaterally. He has also decreased breath sounds bilaterally at the bases. Abdomen: soft, non-tender, without masses or organomegaly EXT: Slight edema noted left more than the right. With calf tenderness. Skin:  No rash noted at this time. Back: point tender in the lumbar region of the spine. No lesions on inspection. Neurological exam: No deficits in motor, sensory or deep tendon reflexes. He is able to ambulate without any difficulties.  Lab Results: Lab Results  Component Value Date   WBC 7.8 06/07/2015   HGB 13.1 06/07/2015   HCT 37.7* 06/07/2015   MCV 89.5 06/07/2015   PLT 211 06/07/2015     Chemistry      Component Value Date/Time   NA 136 06/07/2015 0909   NA 136 11/23/2014 1844   K 3.9 06/07/2015 0909   K 3.2* 11/23/2014 1844   CL 101 11/23/2014 1844   CO2 24 06/07/2015 0909   CO2 22 11/18/2013 0457   BUN 11.4 06/07/2015 0909   BUN 22 11/23/2014 1844   CREATININE 0.7 06/07/2015 0909     CREATININE 0.90 11/23/2014 1844   CREATININE 0.69 08/16/2013 1727      Component Value Date/Time   CALCIUM 8.3* 06/07/2015 0909   CALCIUM 9.1 11/18/2013 0457   ALKPHOS 131 06/07/2015 0909   ALKPHOS 124* 11/16/2013 0630   AST 25 06/07/2015 0909   AST 18 11/16/2013 0630   ALT 14 06/07/2015 0909   ALT 17 11/16/2013 0630   BILITOT 0.80 06/07/2015 0909   BILITOT 0.6 11/16/2013 0630                                             44 year-old gentleman with the following issues:  1. Stage IV adenocarcinoma of the lung with a left lung mass and bilateral pulmonary nodules. His tumor is EGFR mutated but the ALK mutation was not detected.    CT scan on 10/04/2014  showed progression of disease on Tarceva..His parenchymal metastasis as well as lymph node metastasis have increased. After 6 weeks of Afatinib, he experienced a number of side effects that  required treatment interruption.   He is currently on Gilotrif  at 20 mg daily and have tolerated it well. But most recently have progressed.  CT scan of on 04/05/2015 showed progression of his disease including lung and liver metastasis. The plan is to start Tagrisso at 80 mg daily in attempt to palliate his symptoms. Risks and benefits of this medication were reviewed. Pulmonary toxicities such as pneumonitis were reviewed. Other complications including fatigue and skin rash GI symptoms were also reviewed. He is agreeable to proceed at this time. Alternatively systemic chemotherapy would be his only option.   2. Acneiform rash: Seems to  Have resolved at this time.  3. Paronychia-likely related to Gilotrif therapy. This will be less an issue upon continuation this medication.  4. Constipation: This has improved since the last visit. I advised him about to be cautious about potential diarrhea.  5. Brain metastasis: He is status post radiation therapy. MRI of the brain on 02/21/2015 showed excellent response to radiation. No new neurological symptoms.  6. Dyspnea on exertion and slight hypoxia and chest wall pain: This is related to cancer progression at this time. I have instructed him to use oxycodone for chest wall pain. We will check his oxygen saturation at rest and refer him for home O2 evaluation. I have also encouraged him to continue with inhalers and antitussives.  7. Lower extremity pain and swelling of the left calf: He is at risk of deep vein thrombosis and we will obtain lower extremity Dopplers to rule out a blood clot at this time.  8. Weight loss: He is to continue nutritional supplements as well as a prescription for Megace in an attempt to boost his appetite.  9. Follow-up: Will be in 2-3 weeks to assess his clinical status.  [facility], MD 9/28/20169:56 AM

## 2015-06-07 NOTE — Progress Notes (Addendum)
*  Preliminary Results* Bilateral lower extremity venous duplex completed. The right lower extremity is negative for deep vein thrombosis. The left lower extremity is positive for deep vein thrombosis involving the left popliteal, gastrocnemius, posterior tibial, and peroneal veins. There is no evidence of Baker's cyst bilaterally.  Preliminary results discussed with Dr. Alen Blew.  06/07/2015 12:08 PM  Maudry Mayhew, RVT, RDCS, RDMS

## 2015-06-07 NOTE — Progress Notes (Signed)
Addendum: His lower extremity Dopplers did show deep vein thrombosis and we will start him on Lovenox at therapeutic doses of 1.5 mg/kg daily.

## 2015-06-07 NOTE — Progress Notes (Signed)
Faxed orders to [place] for home oxygen. Paperwork received. Patient is positive for a  DVT in his left lower leg. Prescription for Lovenox given to patient and wife. First Lovenox injection given here in the office at the [facility] per Dr. Alen Blew. Lovenox instructions given to patient and wife. Injection given by nurse, patient and wife verbalized understanding. Teach back method demonstrated.

## 2015-06-07 NOTE — Addendum Note (Signed)
Addended by: Wyatt Portela on: 06/07/2015 12:20 PM   Modules accepted: Orders

## 2015-06-07 NOTE — Telephone Encounter (Signed)
Gave and printed appt sched and avs fo rpt for SEpt and OCT °

## 2015-06-12 ENCOUNTER — Other Ambulatory Visit: Payer: Self-pay | Admitting: *Deleted

## 2015-06-12 ENCOUNTER — Telehealth: Payer: Self-pay | Admitting: *Deleted

## 2015-06-12 ENCOUNTER — Encounter: Payer: Self-pay | Admitting: Oncology

## 2015-06-12 DIAGNOSIS — C3490 Malignant neoplasm of unspecified part of unspecified bronchus or lung: Secondary | ICD-10-CM

## 2015-06-12 NOTE — Progress Notes (Signed)
Patient calling to c/o nasal passages drying out. Per kristin @ [facility] okay to use water soluble k-y jelly and Per kristin curcio, humidifier ordered for  oxygen at home. Patient c/o of feet still swollen. Encouraged to keep feet elevated while sitting. Call for fever, red streaks going up his leg. Pain with weight bearing. Or hot to touch. Patient verbalized understanding.

## 2015-06-12 NOTE — Progress Notes (Signed)
I mailed application for asst to the patient for help with tagrisso. [employer]

## 2015-06-12 NOTE — Telephone Encounter (Signed)
PT. REQUESTING A RETURN CALL.

## 2015-06-13 ENCOUNTER — Telehealth: Payer: Self-pay | Admitting: *Deleted

## 2015-06-13 NOTE — Telephone Encounter (Signed)
Voicemail from patient.  "I need to know about medication ordered by Dr. Alen Blew shipped to my home.  Please call [phone]."  JOI  [phone] voicemail retrieved at [phone], forwarded at [phone]

## 2015-06-14 ENCOUNTER — Encounter: Payer: Self-pay | Admitting: Oncology

## 2015-06-14 NOTE — Progress Notes (Signed)
I faxed [employer] app for possible asst with tagresso  [phone]

## 2015-06-16 ENCOUNTER — Encounter: Payer: Self-pay | Admitting: *Deleted

## 2015-06-18 ENCOUNTER — Inpatient Hospital Stay (HOSPITAL_COMMUNITY): Payer: Medicaid Other

## 2015-06-18 ENCOUNTER — Emergency Department (HOSPITAL_COMMUNITY): Payer: Medicaid Other

## 2015-06-18 ENCOUNTER — Encounter (HOSPITAL_COMMUNITY): Payer: Self-pay | Admitting: Emergency Medicine

## 2015-06-18 ENCOUNTER — Inpatient Hospital Stay (HOSPITAL_COMMUNITY)
Admission: EM | Admit: 2015-06-18 | Discharge: 2015-07-11 | DRG: 208 | Disposition: E | Payer: Medicaid Other | Attending: Pulmonary Disease | Admitting: Pulmonary Disease

## 2015-06-18 DIAGNOSIS — Y95 Nosocomial condition: Secondary | ICD-10-CM | POA: Diagnosis present

## 2015-06-18 DIAGNOSIS — E7800 Pure hypercholesterolemia, unspecified: Secondary | ICD-10-CM | POA: Diagnosis present

## 2015-06-18 DIAGNOSIS — Z9981 Dependence on supplemental oxygen: Secondary | ICD-10-CM

## 2015-06-18 DIAGNOSIS — C7931 Secondary malignant neoplasm of brain: Secondary | ICD-10-CM | POA: Diagnosis present

## 2015-06-18 DIAGNOSIS — C3492 Malignant neoplasm of unspecified part of left bronchus or lung: Secondary | ICD-10-CM | POA: Diagnosis present

## 2015-06-18 DIAGNOSIS — Z9289 Personal history of other medical treatment: Secondary | ICD-10-CM

## 2015-06-18 DIAGNOSIS — C3491 Malignant neoplasm of unspecified part of right bronchus or lung: Secondary | ICD-10-CM | POA: Diagnosis present

## 2015-06-18 DIAGNOSIS — E872 Acidosis: Secondary | ICD-10-CM | POA: Diagnosis present

## 2015-06-18 DIAGNOSIS — T451X5A Adverse effect of antineoplastic and immunosuppressive drugs, initial encounter: Secondary | ICD-10-CM | POA: Diagnosis present

## 2015-06-18 DIAGNOSIS — E876 Hypokalemia: Secondary | ICD-10-CM | POA: Diagnosis present

## 2015-06-18 DIAGNOSIS — Z6822 Body mass index (BMI) 22.0-22.9, adult: Secondary | ICD-10-CM

## 2015-06-18 DIAGNOSIS — Z9689 Presence of other specified functional implants: Secondary | ICD-10-CM

## 2015-06-18 DIAGNOSIS — E440 Moderate protein-calorie malnutrition: Secondary | ICD-10-CM | POA: Diagnosis present

## 2015-06-18 DIAGNOSIS — I824Z2 Acute embolism and thrombosis of unspecified deep veins of left distal lower extremity: Secondary | ICD-10-CM | POA: Diagnosis present

## 2015-06-18 DIAGNOSIS — Z66 Do not resuscitate: Secondary | ICD-10-CM | POA: Diagnosis present

## 2015-06-18 DIAGNOSIS — Z515 Encounter for palliative care: Secondary | ICD-10-CM | POA: Diagnosis not present

## 2015-06-18 DIAGNOSIS — J189 Pneumonia, unspecified organism: Secondary | ICD-10-CM | POA: Diagnosis present

## 2015-06-18 DIAGNOSIS — Z923 Personal history of irradiation: Secondary | ICD-10-CM | POA: Diagnosis not present

## 2015-06-18 DIAGNOSIS — N179 Acute kidney failure, unspecified: Secondary | ICD-10-CM | POA: Diagnosis present

## 2015-06-18 DIAGNOSIS — Z79899 Other long term (current) drug therapy: Secondary | ICD-10-CM | POA: Diagnosis not present

## 2015-06-18 DIAGNOSIS — I2699 Other pulmonary embolism without acute cor pulmonale: Principal | ICD-10-CM

## 2015-06-18 DIAGNOSIS — R0902 Hypoxemia: Secondary | ICD-10-CM

## 2015-06-18 DIAGNOSIS — Z823 Family history of stroke: Secondary | ICD-10-CM

## 2015-06-18 DIAGNOSIS — C7951 Secondary malignant neoplasm of bone: Secondary | ICD-10-CM | POA: Diagnosis present

## 2015-06-18 DIAGNOSIS — C801 Malignant (primary) neoplasm, unspecified: Secondary | ICD-10-CM | POA: Diagnosis present

## 2015-06-18 DIAGNOSIS — J9621 Acute and chronic respiratory failure with hypoxia: Secondary | ICD-10-CM

## 2015-06-18 DIAGNOSIS — J80 Acute respiratory distress syndrome: Secondary | ICD-10-CM

## 2015-06-18 DIAGNOSIS — J9601 Acute respiratory failure with hypoxia: Secondary | ICD-10-CM | POA: Diagnosis present

## 2015-06-18 DIAGNOSIS — C3490 Malignant neoplasm of unspecified part of unspecified bronchus or lung: Secondary | ICD-10-CM

## 2015-06-18 DIAGNOSIS — C787 Secondary malignant neoplasm of liver and intrahepatic bile duct: Secondary | ICD-10-CM | POA: Diagnosis present

## 2015-06-18 DIAGNOSIS — R000 Tachycardia, unspecified: Secondary | ICD-10-CM | POA: Diagnosis present

## 2015-06-18 DIAGNOSIS — J9600 Acute respiratory failure, unspecified whether with hypoxia or hypercapnia: Secondary | ICD-10-CM

## 2015-06-18 HISTORY — DX: Other pulmonary embolism without acute cor pulmonale: I26.99

## 2015-06-18 LAB — BLOOD GAS, ARTERIAL
ACID-BASE DEFICIT: 6.1 mmol/L — AB (ref 0.0–2.0)
Acid-base deficit: 4.7 mmol/L — ABNORMAL HIGH (ref 0.0–2.0)
Acid-base deficit: 4.2 mmol/L — ABNORMAL HIGH (ref 0.0–2.0)
Acid-base deficit: 7.2 mmol/L — ABNORMAL HIGH (ref 0.0–2.0)
BICARBONATE: 18.8 meq/L — AB (ref 20.0–24.0)
BICARBONATE: 20.1 meq/L (ref 20.0–24.0)
Bicarbonate: 18 mEq/L — ABNORMAL LOW (ref 20.0–24.0)
Bicarbonate: 19.6 mEq/L — ABNORMAL LOW (ref 20.0–24.0)
DELIVERY SYSTEMS: POSITIVE
DRAWN BY: 295031
DRAWN BY: 295031
Drawn by: 418751
Drawn by: 418751
Expiratory PAP: 6
FIO2: 1
FIO2: 1
FIO2: 100
FIO2: 100
Inspiratory PAP: 12
LHR: 32 {breaths}/min
LHR: 20 {breaths}/min
LHR: 20 {breaths}/min
MECHVT: 420 mL
MODE: POSITIVE
O2 SAT: 98.7 %
O2 Saturation: 89.9 %
O2 Saturation: 93.8 %
O2 Saturation: 96.8 %
PATIENT TEMPERATURE: 98.6
PCO2 ART: 27.4 mmHg — AB (ref 35.0–45.0)
PEEP/CPAP: 20 cmH2O
PEEP: 5 cmH2O
PH ART: 7.434 (ref 7.350–7.450)
PO2 ART: 90.5 mmHg (ref 80.0–100.0)
PO2 ART: 108 mmHg — AB (ref 80.0–100.0)
Patient temperature: 98.6
Patient temperature: 98.6
Patient temperature: 98.6
TCO2: 16.4 mmol/L (ref 0–100)
TCO2: 16.8 mmol/L (ref 0–100)
TCO2: 18.7 mmol/L (ref 0–100)
TCO2: 18.1 mmol/L (ref 0–100)
VT: 560 mL
pCO2 arterial: 29.8 mmHg — ABNORMAL LOW (ref 35.0–45.0)
pCO2 arterial: 49.8 mmHg — ABNORMAL HIGH (ref 35.0–45.0)
pCO2 arterial: 41.3 mmHg (ref 35.0–45.0)
pH, Arterial: 7.416 (ref 7.350–7.450)
pH, Arterial: 7.23 — ABNORMAL LOW (ref 7.350–7.450)
pH, Arterial: 7.297 — ABNORMAL LOW (ref 7.350–7.450)
pO2, Arterial: 61.4 mmHg — ABNORMAL LOW (ref 80.0–100.0)
pO2, Arterial: 153 mmHg — ABNORMAL HIGH (ref 80.0–100.0)

## 2015-06-18 LAB — I-STAT CHEM 8, ED
BUN: 11 mg/dL (ref 6–20)
CALCIUM ION: 1.18 mmol/L (ref 1.12–1.23)
CHLORIDE: 105 mmol/L (ref 101–111)
CREATININE: 0.6 mg/dL — AB (ref 0.61–1.24)
GLUCOSE: 112 mg/dL — AB (ref 65–99)
HCT: 40 % (ref 39.0–52.0)
Hemoglobin: 13.6 g/dL (ref 13.0–17.0)
POTASSIUM: 3.3 mmol/L — AB (ref 3.5–5.1)
Sodium: 137 mmol/L (ref 135–145)
TCO2: 18 mmol/L (ref 0–100)

## 2015-06-18 LAB — URINALYSIS, ROUTINE W REFLEX MICROSCOPIC
Bilirubin Urine: NEGATIVE
GLUCOSE, UA: NEGATIVE mg/dL
HGB URINE DIPSTICK: NEGATIVE
Ketones, ur: 40 mg/dL — AB
Leukocytes, UA: NEGATIVE
Nitrite: NEGATIVE
Protein, ur: NEGATIVE mg/dL
UROBILINOGEN UA: 0.2 mg/dL (ref 0.0–1.0)
pH: 6 (ref 5.0–8.0)

## 2015-06-18 LAB — I-STAT TROPONIN, ED
TROPONIN I, POC: 0 ng/mL (ref 0.00–0.08)
TROPONIN I, POC: 0 ng/mL (ref 0.00–0.08)

## 2015-06-18 LAB — PHOSPHORUS: Phosphorus: 4.4 mg/dL (ref 2.5–4.6)

## 2015-06-18 LAB — BASIC METABOLIC PANEL
ANION GAP: 10 (ref 5–15)
BUN: 12 mg/dL (ref 6–20)
CALCIUM: 8.6 mg/dL — AB (ref 8.9–10.3)
CO2: 20 mmol/L — AB (ref 22–32)
CREATININE: 0.68 mg/dL (ref 0.61–1.24)
Chloride: 106 mmol/L (ref 101–111)
GFR calc non Af Amer: >60 mL/min (ref >60)
Glucose, Bld: 114 mg/dL — ABNORMAL HIGH (ref 65–99)
Potassium: 3.3 mmol/L — ABNORMAL LOW (ref 3.5–5.1)
SODIUM: 136 mmol/L (ref 135–145)

## 2015-06-18 LAB — CBC
HEMATOCRIT: 37.8 % — AB (ref 39.0–52.0)
HEMOGLOBIN: 13.3 g/dL (ref 13.0–17.0)
MCH: 30.6 pg (ref 26.0–34.0)
MCHC: 35.2 g/dL (ref 30.0–36.0)
MCV: 86.9 fL (ref 78.0–100.0)
Platelets: 323 10*3/uL (ref 150–400)
RBC: 4.35 MIL/uL (ref 4.22–5.81)
RDW: 12.7 % (ref 11.5–15.5)
WBC: 10.5 10*3/uL (ref 4.0–10.5)

## 2015-06-18 LAB — HEPARIN LEVEL (UNFRACTIONATED)
HEPARIN UNFRACTIONATED: 0.11 [IU]/mL — AB (ref 0.30–0.70)
HEPARIN UNFRACTIONATED: 0.31 [IU]/mL (ref 0.30–0.70)

## 2015-06-18 LAB — COMPREHENSIVE METABOLIC PANEL
ALT: 33 U/L (ref 17–63)
AST: 39 U/L (ref 15–41)
Albumin: 2.5 g/dL — ABNORMAL LOW (ref 3.5–5.0)
Alkaline Phosphatase: 138 U/L — ABNORMAL HIGH (ref 38–126)
Anion gap: 8 (ref 5–15)
BILIRUBIN TOTAL: 0.8 mg/dL (ref 0.3–1.2)
BUN: 10 mg/dL (ref 6–20)
CO2: 19 mmol/L — ABNORMAL LOW (ref 22–32)
CREATININE: 0.53 mg/dL — AB (ref 0.61–1.24)
Calcium: 8.3 mg/dL — ABNORMAL LOW (ref 8.9–10.3)
Chloride: 110 mmol/L (ref 101–111)
GFR calc Af Amer: >60 mL/min (ref >60)
Glucose, Bld: 98 mg/dL (ref 65–99)
Potassium: 3.6 mmol/L (ref 3.5–5.1)
Sodium: 137 mmol/L (ref 135–145)
TOTAL PROTEIN: 5.9 g/dL — AB (ref 6.5–8.1)

## 2015-06-18 LAB — I-STAT CG4 LACTIC ACID, ED
LACTIC ACID, VENOUS: 0.89 mmol/L (ref 0.5–2.0)
LACTIC ACID, VENOUS: 0.52 mmol/L (ref 0.5–2.0)

## 2015-06-18 LAB — CBC WITH DIFFERENTIAL/PLATELET
BASOS ABS: 0 10*3/uL (ref 0.0–0.1)
Basophils Relative: 0 %
Eosinophils Absolute: 0 10*3/uL (ref 0.0–0.7)
Eosinophils Relative: 0 %
HEMATOCRIT: 36.5 % — AB (ref 39.0–52.0)
Hemoglobin: 12.9 g/dL — ABNORMAL LOW (ref 13.0–17.0)
LYMPHS PCT: 3 %
Lymphs Abs: 0.4 10*3/uL — ABNORMAL LOW (ref 0.7–4.0)
MCH: 30.6 pg (ref 26.0–34.0)
MCHC: 35.3 g/dL (ref 30.0–36.0)
MCV: 86.5 fL (ref 78.0–100.0)
MONO ABS: 0.4 10*3/uL (ref 0.1–1.0)
Monocytes Relative: 3 %
NEUTROS ABS: 9.8 10*3/uL — AB (ref 1.7–7.7)
Neutrophils Relative %: 94 %
Platelets: 299 10*3/uL (ref 150–400)
RBC: 4.22 MIL/uL (ref 4.22–5.81)
RDW: 12.9 % (ref 11.5–15.5)
WBC: 10.6 10*3/uL — AB (ref 4.0–10.5)

## 2015-06-18 LAB — PROTIME-INR
INR: 1.25 (ref 0.00–1.49)
INR: 1.34 (ref 0.00–1.49)
PROTHROMBIN TIME: 16.7 s — AB (ref 11.6–15.2)
Prothrombin Time: 15.8 seconds — ABNORMAL HIGH (ref 11.6–15.2)

## 2015-06-18 LAB — LACTATE DEHYDROGENASE: LDH: 417 U/L — ABNORMAL HIGH (ref 98–192)

## 2015-06-18 LAB — CORTISOL: Cortisol, Plasma: 24.2 ug/dL

## 2015-06-18 LAB — APTT
APTT: 50 s — AB (ref 24–37)
aPTT: 33 seconds (ref 24–37)

## 2015-06-18 LAB — BRAIN NATRIURETIC PEPTIDE: B NATRIURETIC PEPTIDE 5: 41.6 pg/mL (ref 0.0–100.0)

## 2015-06-18 LAB — MRSA PCR SCREENING: MRSA BY PCR: NEGATIVE

## 2015-06-18 LAB — MAGNESIUM: MAGNESIUM: 1.8 mg/dL (ref 1.7–2.4)

## 2015-06-18 MED ORDER — METHYLPREDNISOLONE SODIUM SUCC 40 MG IJ SOLR
40.0000 mg | Freq: Two times a day (BID) | INTRAMUSCULAR | Status: DC
  Administered 2015-06-18 – 2015-06-22 (×9): 40 mg via INTRAVENOUS
  Filled 2015-06-18 (×9): qty 1

## 2015-06-18 MED ORDER — DIPHENHYDRAMINE HCL 50 MG/ML IJ SOLN
25.0000 mg | Freq: Once | INTRAMUSCULAR | Status: AC
  Administered 2015-06-18: 25 mg via INTRAVENOUS
  Filled 2015-06-18: qty 1

## 2015-06-18 MED ORDER — HEPARIN BOLUS VIA INFUSION
2000.0000 [IU] | INTRAVENOUS | Status: AC
  Administered 2015-06-18: 2000 [IU] via INTRAVENOUS
  Filled 2015-06-18: qty 2000

## 2015-06-18 MED ORDER — ROCURONIUM BROMIDE 50 MG/5ML IV SOLN
INTRAVENOUS | Status: AC
  Administered 2015-06-18: 50 mg
  Filled 2015-06-18: qty 2

## 2015-06-18 MED ORDER — ANTISEPTIC ORAL RINSE SOLUTION (CORINZ)
7.0000 mL | Freq: Four times a day (QID) | OROMUCOSAL | Status: DC
  Administered 2015-06-19 – 2015-06-23 (×18): 7 mL via OROMUCOSAL

## 2015-06-18 MED ORDER — CHLORHEXIDINE GLUCONATE 0.12% ORAL RINSE (MEDLINE KIT)
15.0000 mL | Freq: Two times a day (BID) | OROMUCOSAL | Status: DC
  Administered 2015-06-18 – 2015-06-22 (×9): 15 mL via OROMUCOSAL

## 2015-06-18 MED ORDER — LIDOCAINE HCL (CARDIAC) 20 MG/ML IV SOLN
INTRAVENOUS | Status: AC
  Filled 2015-06-18: qty 5

## 2015-06-18 MED ORDER — FENTANYL CITRATE (PF) 100 MCG/2ML IJ SOLN
INTRAMUSCULAR | Status: AC
  Administered 2015-06-18: 100 ug
  Filled 2015-06-18: qty 4

## 2015-06-18 MED ORDER — VANCOMYCIN HCL 10 G IV SOLR
1250.0000 mg | Freq: Once | INTRAVENOUS | Status: AC
  Administered 2015-06-18: 1250 mg via INTRAVENOUS
  Filled 2015-06-18: qty 1250

## 2015-06-18 MED ORDER — VANCOMYCIN HCL IN DEXTROSE 1-5 GM/200ML-% IV SOLN
1000.0000 mg | Freq: Three times a day (TID) | INTRAVENOUS | Status: DC
  Administered 2015-06-18 – 2015-06-20 (×5): 1000 mg via INTRAVENOUS
  Filled 2015-06-18 (×5): qty 200

## 2015-06-18 MED ORDER — MIDAZOLAM HCL 2 MG/2ML IJ SOLN
INTRAMUSCULAR | Status: AC
  Administered 2015-06-18: 2 mg
  Filled 2015-06-18: qty 4

## 2015-06-18 MED ORDER — HEPARIN (PORCINE) IN NACL 100-0.45 UNIT/ML-% IJ SOLN
1250.0000 [IU]/h | INTRAMUSCULAR | Status: DC
  Administered 2015-06-18 – 2015-06-21 (×5): 1250 [IU]/h via INTRAVENOUS
  Filled 2015-06-18 (×8): qty 250

## 2015-06-18 MED ORDER — SUCCINYLCHOLINE CHLORIDE 20 MG/ML IJ SOLN
INTRAMUSCULAR | Status: AC
  Filled 2015-06-18: qty 1

## 2015-06-18 MED ORDER — SODIUM CHLORIDE 0.9 % IV SOLN
1000.0000 mL | Freq: Once | INTRAVENOUS | Status: AC
  Administered 2015-06-18: 1000 mL via INTRAVENOUS

## 2015-06-18 MED ORDER — SODIUM CHLORIDE 0.9 % IV SOLN
INTRAVENOUS | Status: DC
  Administered 2015-06-18 – 2015-06-19 (×2): via INTRAVENOUS
  Administered 2015-06-19: 1000 mL via INTRAVENOUS
  Administered 2015-06-19: via INTRAVENOUS
  Administered 2015-06-20: 1000 mL via INTRAVENOUS
  Administered 2015-06-21 (×2): via INTRAVENOUS

## 2015-06-18 MED ORDER — SODIUM CHLORIDE 0.9 % IV SOLN
50.0000 ug/h | INTRAVENOUS | Status: DC
  Administered 2015-06-18: 25 ug/h via INTRAVENOUS
  Administered 2015-06-19: 150 ug/h via INTRAVENOUS
  Administered 2015-06-20: 55 ug/h via INTRAVENOUS
  Administered 2015-06-21: 150 ug/h via INTRAVENOUS
  Administered 2015-06-22: 200 ug/h via INTRAVENOUS
  Administered 2015-06-23: 150 ug/h via INTRAVENOUS
  Filled 2015-06-18 (×7): qty 50

## 2015-06-18 MED ORDER — DIPHENHYDRAMINE HCL 25 MG PO CAPS
25.0000 mg | ORAL_CAPSULE | Freq: Three times a day (TID) | ORAL | Status: DC
  Administered 2015-06-18 – 2015-06-20 (×5): 25 mg via ORAL
  Filled 2015-06-18 (×6): qty 1

## 2015-06-18 MED ORDER — ETOMIDATE 2 MG/ML IV SOLN
INTRAVENOUS | Status: AC
  Administered 2015-06-18: 20 mg
  Filled 2015-06-18: qty 20

## 2015-06-18 MED ORDER — PANTOPRAZOLE SODIUM 40 MG IV SOLR
40.0000 mg | INTRAVENOUS | Status: DC
  Administered 2015-06-18 – 2015-06-22 (×5): 40 mg via INTRAVENOUS
  Filled 2015-06-18 (×5): qty 40

## 2015-06-18 MED ORDER — DEXTROSE 5 % IV SOLN
2.0000 g | Freq: Three times a day (TID) | INTRAVENOUS | Status: DC
  Administered 2015-06-18 – 2015-06-22 (×13): 2 g via INTRAVENOUS
  Filled 2015-06-18 (×14): qty 2

## 2015-06-18 MED ORDER — PROPOFOL 1000 MG/100ML IV EMUL
5.0000 ug/kg/min | INTRAVENOUS | Status: DC
  Administered 2015-06-18: 5 ug/kg/min via INTRAVENOUS
  Administered 2015-06-19 (×2): 30 ug/kg/min via INTRAVENOUS
  Administered 2015-06-19: 50 ug/kg/min via INTRAVENOUS
  Administered 2015-06-20 (×4): 55 ug/kg/min via INTRAVENOUS
  Administered 2015-06-20: 54.994 ug/kg/min via INTRAVENOUS
  Administered 2015-06-21: 65 ug/kg/min via INTRAVENOUS
  Administered 2015-06-21: 80 ug/kg/min via INTRAVENOUS
  Administered 2015-06-21: 65 ug/kg/min via INTRAVENOUS
  Administered 2015-06-21: 80 ug/kg/min via INTRAVENOUS
  Administered 2015-06-21: 50 ug/kg/min via INTRAVENOUS
  Administered 2015-06-21 – 2015-06-23 (×9): 80 ug/kg/min via INTRAVENOUS
  Filled 2015-06-18 (×24): qty 100

## 2015-06-18 MED ORDER — NITROGLYCERIN 0.4 MG SL SUBL: 0.4000 mg | SUBLINGUAL_TABLET | SUBLINGUAL | Status: DC | PRN

## 2015-06-18 MED ORDER — SULFAMETHOXAZOLE-TRIMETHOPRIM 400-80 MG/5ML IV SOLN
400.0000 mg | Freq: Three times a day (TID) | INTRAVENOUS | Status: DC
  Administered 2015-06-18 – 2015-06-21 (×9): 400 mg via INTRAVENOUS
  Filled 2015-06-18 (×10): qty 25

## 2015-06-18 MED ORDER — IOHEXOL 350 MG/ML SOLN
100.0000 mL | Freq: Once | INTRAVENOUS | Status: AC | PRN
  Administered 2015-06-18: 100 mL via INTRAVENOUS

## 2015-06-18 MED ORDER — ASPIRIN 81 MG PO CHEW
324.0000 mg | CHEWABLE_TABLET | Freq: Once | ORAL | Status: AC
  Administered 2015-06-18: 324 mg via ORAL
  Filled 2015-06-18: qty 4

## 2015-06-18 MED ORDER — PROPOFOL 1000 MG/100ML IV EMUL
INTRAVENOUS | Status: AC
  Administered 2015-06-18: 80 mg
  Filled 2015-06-18: qty 100

## 2015-06-18 NOTE — ED Notes (Signed)
Pt from home reports SOB that worsened last pm. Pt denies feeling bad or sickness. Pt reports CP with SOB. Pt sts that he is coughing up clear mucous. Pt has hx of lung CA with mets to brain. Pt lung sounds are diminished bilaterally. Pt in NAD

## 2015-06-18 NOTE — ED Provider Notes (Signed)
CSN: [id number]     Arrival date & time 06/13/2015  0827 History   First MD Initiated Contact with Patient 06/10/2015 0840     Chief Complaint  Patient presents with  . Shortness of Breath     (Consider location/radiation/quality/duration/timing/severity/associated sxs/prior Treatment) Patient is a 44 y.o. male presenting with shortness of breath. The history is provided by the patient.  Shortness of Breath Severity:  Severe Onset quality:  Sudden Duration:  2 days Timing:  Constant Progression:  Worsening Chronicity:  New Relieved by:  Nothing Worsened by:  Nothing tried Ineffective treatments:  None tried Associated symptoms: chest pain, cough and sputum production   Associated symptoms: no abdominal pain, no fever, no headaches, no rash and no vomiting   Risk factors: hx of cancer (lung ca)    44 yo M with a chief complaint shortness of breath. This started yesterday and is gotten significantly worse this morning. Patient has a history of lung cancer, is being treated for left lower extremity DVT with Lovenox. On 2 L of oxygen at all times home. Patient having chills but no fevers. Coughing up significant amounts of sputum over the past couple days. Patient is also having substernal chest pain with this. Feels like a pressure. Has no radiation. Has had no diaphoresis nausea or vomiting.   Past Medical History  Diagnosis Date  . High cholesterol   . Pneumonia   . Allergy   . S/P radiation therapy 10/19/14-11/08/14    brain mets  . S/P radiation therapy 03/27/15    L-spine 79f/30GY  . Adenocarcinoma ([facility] 11/21/2013    Lung  . [place] cancer [place] 10/10/14 MRI    40 lesions mets  . [place]     and DVT   Past Surgical History  Procedure Laterality Date  . Carpal tunner    . Tendon repair    . Video bronchoscopy Bilateral 11/17/2013    Procedure: VIDEO BRONCHOSCOPY WITH FLUORO;  Surgeon: WRush Farmer MD;  [facility] [facility]  Service:  Cardiopulmonary;  Laterality: Bilateral;  . Video bronchoscopy with endobronchial ultrasound N/A 11/19/2013    Procedure: VIDEO BRONCHOSCOPY WITH ENDOBRONCHIAL ULTRASOUND;  Surgeon: RCollene Gobble MD;  Location: [facility];  Service: Thoracic;  Laterality: N/A;   Family History  Problem Relation Age of Onset  . Stroke Father    Social History  Substance Use Topics  . Smoking status: Never Smoker   . Smokeless tobacco: Never Used  . Alcohol Use: No    Review of Systems  Constitutional: Negative for fever and chills.  HENT: Negative for congestion and facial swelling.   Eyes: Negative for discharge and visual disturbance.  Respiratory: Positive for cough, sputum production and shortness of breath.   Cardiovascular: Positive for chest pain. Negative for palpitations.  Gastrointestinal: Negative for vomiting, abdominal pain and diarrhea.  Musculoskeletal: Negative for myalgias and arthralgias.  Skin: Negative for color change and rash.  Neurological: Negative for tremors, syncope and headaches.  Psychiatric/Behavioral: Negative for confusion and dysphoric mood.      Allergies  Amoxicillin-pot clavulanate and Penicillins  Home Medications   Prior to Admission medications   Medication Sig Start Date End Date Taking? Authorizing Provider  acetaminophen (TYLENOL) 325 MG tablet Take 650 mg by mouth every 6 (six) hours as needed for mild pain.    Yes Historical Provider, MD  afatinib dimaleate (GILOTRIF) 20 MG tablet Take 20 mg by mouth daily. Take on an empty stomach 1hr before or 2hrs  after meals.   Yes Historical Provider, MD  albuterol (PROVENTIL HFA;VENTOLIN HFA) 108 (90 BASE) MCG/ACT inhaler Inhale 2 puffs into the lungs every 6 (six) hours as needed for wheezing or shortness of breath. 05/12/15  Yes Tresa Garter, MD  benzonatate (TESSALON) 100 MG capsule Take 1 to 2 capsules by mouth every 8 hours as needed for cough 05/03/15  Yes Tresa Garter, MD   chlorpheniramine-HYDROcodone (TUSSIONEX PENNKINETIC ER) 10-8 MG/5ML LQCR Take 5 mLs by mouth every 12 (twelve) hours as needed for cough. 11/21/13  Yes Kelvin Cellar, MD  diphenoxylate-atropine (LOMOTIL) 2.5-0.025 MG per tablet Take 2 tablets by mouth 4 (four) times daily as needed for diarrhea or loose stools. 11/02/14  Yes Adrena E Johnson, PA-C  enoxaparin (LOVENOX) 150 MG/ML injection Inject 0.63 mLs (95 mg total) into the skin daily. 06/07/15  Yes Wyatt Portela, MD  fluticasone (FLONASE) 50 MCG/ACT nasal spray Place 2 sprays into both nostrils daily. 09/15/14  Yes Tresa Garter, MD  guaiFENesin (MUCINEX) 600 MG 12 hr tablet Take 600 mg by mouth 2 (two) times daily as needed for cough or to loosen phlegm.    Yes Historical Provider, MD  hydrOXYzine (ATARAX/VISTARIL) 10 MG tablet Take 1 tablet (10 mg total) by mouth 3 (three) times daily as needed for itching. 03/20/15  Yes Adrena E Johnson, PA-C  Lactulose 20 GM/30ML SOLN Take 30 cc by mouth every 4 hours as needed for constipation. Patient taking differently: Take 30 mLs by mouth every 4 (four) hours as needed (for constipation).  11/30/14  Yes Wyatt Portela, MD  loratadine (CLARITIN) 10 MG tablet Take 1 tablet (10 mg total) by mouth daily. Patient taking differently: Take 10 mg by mouth daily as needed for allergies.  10/16/13  Yes Reyne Dumas, MD  megestrol (MEGACE) 400 MG/10ML suspension Take 10 mLs (400 mg total) by mouth 2 (two) times daily. 06/07/15  Yes Wyatt Portela, MD  metoCLOPramide (REGLAN) 10 MG tablet Take 1 tablet (10 mg total) by mouth every 6 (six) hours as needed for nausea or vomiting (nausea/headache). 11/23/14  Yes Orlie Dakin, MD  oxyCODONE (OXY IR/ROXICODONE) 5 MG immediate release tablet Take 1-2 tablets (5-10 mg total) by mouth every 6 (six) hours as needed for severe pain. 06/07/15  Yes Wyatt Portela, MD  pentoxifylline (TRENTAL) 400 MG CR tablet Take 1 tablet (400 mg total) by mouth 2 (two) times daily. Take 400  units vitamin E twice a day with this medication. 06/02/15  Yes Kyung Rudd, MD  vitamin E 400 UNIT capsule Take 400 Units by mouth 2 (two) times daily.   Yes Historical Provider, MD  doxycycline (VIBRAMYCIN) 100 MG capsule Take 1 capsule (100 mg total) by mouth 2 (two) times daily. Patient not taking: Reported on 07/07/2015 03/02/15   Wyatt Portela, MD  osimertinib mesylate (TAGRISSO) 80 MG tablet Take 1 tablet (80 mg total) by mouth daily. Patient not taking: Reported on 07/07/2015 06/07/15   Wyatt Portela, MD   BP 111/64 mmHg  Pulse 110  Temp(Src) 97.3 F (36.3 C) (Axillary)  Resp 32  SpO2 90% Physical Exam  Constitutional: He is oriented to person, place, and time. He appears well-developed and well-nourished.  HENT:  Head: Normocephalic and atraumatic.  Eyes: EOM are normal. Pupils are equal, round, and reactive to light.  Neck: Normal range of motion. Neck supple. No JVD present.  Cardiovascular: Normal rate and regular rhythm.  Exam reveals no gallop and no friction  rub.   No murmur heard. Pulmonary/Chest: No respiratory distress. He has no wheezes. He has no rales.  Clear lung sounds  Abdominal: He exhibits no distension. There is no tenderness. There is no rebound and no guarding.  Musculoskeletal: Normal range of motion.  Neurological: He is alert and oriented to person, place, and time.  Skin: No rash noted. No pallor.  Psychiatric: He has a normal mood and affect. His behavior is normal.    ED Course  Procedures (including critical care time) Labs Review Labs Reviewed  CBC - Abnormal; Notable for the following:    HCT 37.8 (*)    All other components within normal limits  BASIC METABOLIC PANEL - Abnormal; Notable for the following:    Potassium 3.3 (*)    CO2 20 (*)    Glucose, Bld 114 (*)    Calcium 8.6 (*)    All other components within normal limits  PROTIME-INR - Abnormal; Notable for the following:    Prothrombin Time 15.8 (*)    All other components within  normal limits  BLOOD GAS, ARTERIAL - Abnormal; Notable for the following:    pCO2 arterial 27.4 (*)    pO2, Arterial 61.4 (*)    Bicarbonate 18.0 (*)    Acid-base deficit 4.7 (*)    All other components within normal limits  HEPARIN LEVEL (UNFRACTIONATED) - Abnormal; Notable for the following:    Heparin Unfractionated 0.11 (*)    All other components within normal limits  BLOOD GAS, ARTERIAL - Abnormal; Notable for the following:    pCO2 arterial 29.8 (*)    pO2, Arterial 153 (*)    Bicarbonate 18.8 (*)    Acid-base deficit 4.2 (*)    All other components within normal limits  APTT - Abnormal; Notable for the following:    aPTT 50 (*)    All other components within normal limits  CBC WITH DIFFERENTIAL/PLATELET - Abnormal; Notable for the following:    WBC 10.6 (*)    Hemoglobin 12.9 (*)    HCT 36.5 (*)    Neutro Abs 9.8 (*)    Lymphs Abs 0.4 (*)    All other components within normal limits  COMPREHENSIVE METABOLIC PANEL - Abnormal; Notable for the following:    CO2 19 (*)    Creatinine, Ser 0.53 (*)    Calcium 8.3 (*)    Total Protein 5.9 (*)    Albumin 2.5 (*)    Alkaline Phosphatase 138 (*)    All other components within normal limits  PROTIME-INR - Abnormal; Notable for the following:    Prothrombin Time 16.7 (*)    All other components within normal limits  URINALYSIS, ROUTINE W REFLEX MICROSCOPIC (NOT AT [facility]) - Abnormal; Notable for the following:    Specific Gravity, Urine >1.046 (*)    Ketones, ur 40 (*)    All other components within normal limits  LACTATE DEHYDROGENASE - Abnormal; Notable for the following:    LDH 417 (*)    All other components within normal limits  I-STAT CHEM 8, ED - Abnormal; Notable for the following:    Potassium 3.3 (*)    Creatinine, Ser 0.60 (*)    Glucose, Bld 112 (*)    All other components within normal limits  CULTURE, BLOOD (ROUTINE X 2)  CULTURE, BLOOD (ROUTINE X 2)  URINE CULTURE  CULTURE, EXPECTORATED SPUTUM-ASSESSMENT   CULTURE, EXPECTORATED SPUTUM-ASSESSMENT  MRSA PCR SCREENING  BRAIN NATRIURETIC PEPTIDE  APTT  MAGNESIUM  PHOSPHORUS  HEPARIN LEVEL (UNFRACTIONATED)  CORTISOL  I-STAT TROPOININ, ED  I-STAT CG4 LACTIC ACID, ED  I-STAT TROPOININ, ED  I-STAT CG4 LACTIC ACID, ED    Imaging Review Ct Angio Chest Pe W/cm &/or Wo Cm  07/03/2015   CLINICAL DATA:  Shortness of breath and hypoxia. 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 Known left lower extremity DVT.  EXAM: CT ANGIOGRAPHY CHEST WITH CONTRAST  TECHNIQUE: Multidetector CT imaging of the chest was performed using the standard protocol during bolus administration of intravenous contrast. Multiplanar CT image reconstructions and MIPs were obtained to evaluate the vascular anatomy.  CONTRAST:  165m OMNIPAQUE IOHEXOL 350 MG/ML SOLN  COMPARISON:  04/05/2015  FINDINGS: There is acute pulmonary embolism in the right lung with nonocclusive thrombus identified in the distal aspect of the right main pulmonary artery and extending into lower lobe segmental branches. No left-sided pulmonary embolism identified.  There has been dramatic progression of bilateral lung carcinoma with increase in size of the dominant confluent mass within the posterior left upper lobe now measuring 7.4 cm in greatest diameter and spanning across the entire width of the left upper lobe. There also is dramatic progression in tumor burden in the lingula and left lower lobe as well as throughout all lobes of the right lung with findings consistent with diffuse lymphangitic and hematologic spread throughout the lungs bilaterally. There is no evidence of airway obstruction.  No pleural or pericardial fluid is identified. Mediastinal lymphadenopathy is relatively stable with the largest node measuring 13 mm in short axis in the lower right paratracheal and precarinal region.  The visualized upper abdomen also shows significant progression of metastatic disease in the liver with  tremendously more lesions identified throughout both lobes. The largest visualized lesion is in the medial and inferior left lobe measuring approximately 3.3 x 4.2 cm. This lesion previously only measured approximately 9 mm in July. There also is visible diffuse carcinomatosis in the subphrenic fat of the left upper quadrant adjacent to the stomach and splenic flexure of the colon. This also extends to abut the left lobe of the liver anteriorly and there is a small amount of ascites surrounding the liver.  Multiple sclerotic metastatic lesions of the vertebral bodies again noted. T6 and T7 metastatic lesions appear relatively stable. Posterior T9 lesion appears slightly larger. T10, T11, T12 and L1 lesions appear fairly stable. There is no evidence of compression fracture or visible tumor in the spinal canal. Metastatic disease involving both scapulae appears stable. There is probable metastatic disease involving the right fourth, seventh, eighth and potentially ninth ribs without evidence of pathologic fracture. Tiny sclerotic lesion is suspected in the left fifth rib.  Review of the MIP images confirms the above findings.  IMPRESSION: 1. Acute pulmonary embolism with nonocclusive thrombus identified in the right pulmonary arteries beginning in the distal right main pulmonary artery and extending into lower lobe branches. 2. Marked progression of metastatic adenocarcinoma throughout both lungs and in the liver. 3. Relatively stable metastatic disease involving the spine, bilateral ribs and both scapulae. There may be slight enlargement of a T9 metastatic lesion. 4. Evidence of progressive carcinomatosis in the subphrenic fat of the left upper quadrant with tumor also identified adjacent to the left lobe of the liver. Associated small amount of ascites is seen around the visualized liver. These results were called by telephone at the time of interpretation on 07/08/2015 at 10:10 am to Dr. DDeno Etienne, who verbally  acknowledged these results.   Electronically  Signed   By: Aletta Edouard M.D.   On: 07/06/2015 10:12   Dg Chest Port 1 View  07/09/2015   CLINICAL DATA:  Worsening shortness of breath.  EXAM: PORTABLE CHEST 1 VIEW  COMPARISON:  CT of the chest dated 04/05/2015  FINDINGS: Cardiomediastinal silhouette is largely obscured by innumerable dense miliary pattern bilateral pulmonary nodules with confluent appearance in the midlung fields. The aeration of the lungs demonstrates significant worsening, when compared to 04/05/2015 chest CT. There is no evidence of pneumothorax or large pleural effusions.  Osseous structures are without acute abnormality. Soft tissues are grossly normal.  IMPRESSION: Significant worsening of the aeration of the lungs with now bilateral confluent miliary pattern innumerable pulmonary nodules.   Electronically Signed   By: Fidela Salisbury M.D.   On: 06/19/2015 09:27   I have personally reviewed and evaluated these images and lab results as part of my medical decision-making.   EKG Interpretation   Date/Time:  Sunday June 18 2015 08:45:15 EDT Ventricular Rate:  108 PR Interval:  118 QRS Duration: 88 QT Interval:  435 QTC Calculation: 583 R Axis:   81 Text Interpretation:  Sinus tachycardia RSR' in V1 or V2, probably normal  variant LVH with secondary repolarization abnormality Prolonged QT  interval Baseline wander in lead(s) V3 new rsr pattern in v1,v2 Otherwise  no significant change Confirmed by Londen Lorge MD, DANIEL ([id number]) on 07/08/2015  8:55:42 AM      MDM   Final diagnoses:  PE (pulmonary thromboembolism) (HCC)  Hypoxia  Acute on chronic respiratory failure with hypoxia [facility])    44 yo M with a chief complaint shortness breath. Concern for possible PE with clear lung sounds and hypoxia and sudden onset. Patient was hypoxic in the ED to 76% on his 2 L. Placed on nonrebreather. Patient also complaining of chest pain. EKG with no significant changes. Initial  troponin negative. Chest x-ray with diffuse interstitial opacities. Will obtain a PE scan.  Patient continuing to have increased work of breathing is breathing about 30 times a minute. PE scan positive for PE. Images viewed by myself and discussed with radiologist. Will start on heparin. Also noted to have significant worsening of his underlying lung cancer. Discussed results with patient area would still like to be full code at this time. Placed on BiPAP as a trial to improve work breathing.  Discussed case with critical care, will come and evaluate the patient.  CRITICAL CARE Performed by: Cecilio Asper   Total critical care time: 35 min  Critical care time was exclusive of separately billable procedures and treating other patients.  Critical care was necessary to treat or prevent imminent or life-threatening deterioration.  Critical care was time spent personally by me on the following activities: development of treatment plan with patient and/or surrogate as well as nursing, discussions with consultants, evaluation of patient's response to treatment, examination of patient, obtaining history from patient or surrogate, ordering and performing treatments and interventions, ordering and review of laboratory studies, ordering and review of radiographic studies, pulse oximetry and re-evaluation of patient's condition.  The patients results and plan were reviewed and discussed.   Any x-rays performed were independently reviewed by myself.   Differential diagnosis were considered with the presenting HPI.  Medications  nitroGLYCERIN (NITROSTAT) SL tablet 0.4 mg (not administered)  cefTAZidime (FORTAZ) 2 g in dextrose 5 % 50 mL IVPB (0 g Intravenous Stopped 06/26/2015 1032)  heparin ADULT infusion 100 units/mL (25000 units/250 mL) (1,250 Units/hr Intravenous Rate/Dose  Verify 06/22/2015 1500)  0.9 %  sodium chloride infusion ( Intravenous Rate/Dose Verify 07/02/2015 1500)  pantoprazole (PROTONIX)  injection 40 mg (40 mg Intravenous Given 06/17/2015 1442)  sulfamethoxazole-trimethoprim (BACTRIM) 400 mg in dextrose 5 % 500 mL IVPB (400 mg Intravenous Given 07/06/2015 1441)  diphenhydrAMINE (BENADRYL) capsule 25 mg (not administered)    And  vancomycin (VANCOCIN) IVPB 1000 mg/200 mL premix (not administered)  aspirin chewable tablet 324 mg (324 mg Oral Given 06/30/2015 0855)  0.9 %  sodium chloride infusion (0 mLs Intravenous Stopped 06/22/2015 1032)  iohexol (OMNIPAQUE) 350 MG/ML injection 100 mL (100 mLs Intravenous Contrast Given 06/13/2015 0939)  vancomycin (VANCOCIN) 1,250 mg in sodium chloride 0.9 % 250 mL IVPB (0 mg Intravenous Stopped 06/12/2015 1244)  heparin bolus via infusion 2,000 Units (2,000 Units Intravenous Given 07/08/2015 1059)  diphenhydrAMINE (BENADRYL) injection 25 mg (25 mg Intravenous Given 07/07/2015 1244)    Filed Vitals:   06/12/2015 1315 07/03/2015 1327 06/17/2015 1330 06/10/2015 1400  BP:  101/61 104/56 111/64  Pulse: 106 107  110  Temp:    97.3 F (36.3 C)  TempSrc:    Axillary  Resp: 32 31 33 32  SpO2: 90% 100%  90%    Final diagnoses:  PE (pulmonary thromboembolism) (HCC)  Hypoxia  Acute on chronic respiratory failure with hypoxia (HCC)    Admission/ observation were discussed with the admitting physician, patient and/or family and they are comfortable with the plan.    Deno Etienne, DO 06/19/2015 1535

## 2015-06-18 NOTE — H&P (Signed)
PULMONARY / CRITICAL CARE MEDICINE   Name: Chad George MRN: [id number] DOB: Aug 15, 1971    ADMISSION DATE:  06/16/2015 CONSULTATION DATE:  06/24/2015  REFERRING MD :  EDP  CHIEF COMPLAINT:  PE and acute respiratory failure  INITIAL PRESENTATION: 44 year old male with PMH of stage 4 lung cancer and DVT on lovenox presenting with acute SOB.  CTA revealed a new PE and right sided pulmonary infiltrate.  Patient was placed on BiPAP and PCCM was called to admit.  Reports cough but no sputum production.  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  SIGNIFICANT EVENTS: 10/9 Admission and BiPAP for respiratory failure.   HISTORY OF PRESENT ILLNESS:  44 year old male with PMH of stage 4 lung cancer and DVT on lovenox presenting with acute SOB.  CTA revealed a new PE and right sided pulmonary infiltrate.  Patient was placed on BiPAP and PCCM was called to admit.  Reports cough but no sputum production.  PAST MEDICAL HISTORY :   has a past medical history of High cholesterol; Pneumonia; Allergy; S/P radiation therapy (10/19/14-11/08/14); Adenocarcinoma ([place]) (11/21/2013); Brain cancer [facility]) (10/10/14 MRI); and S/P radiation therapy (03/27/15).  has past surgical history that includes carpal tunner; Tendon repair; Video bronchoscopy (Bilateral, 11/17/2013); and Video bronchoscopy with endobronchial ultrasound (N/A, 11/19/2013). Prior to Admission medications   Medication Sig Start Date End Date Taking? Authorizing Provider  acetaminophen (TYLENOL) 325 MG tablet Take 650 mg by mouth every 6 (six) hours as needed for mild pain.    Yes Historical Provider, MD  afatinib dimaleate (GILOTRIF) 20 MG tablet Take 20 mg by mouth daily. Take on an empty stomach 1hr before or 2hrs after meals.   Yes Historical Provider, MD  albuterol (PROVENTIL HFA;VENTOLIN HFA) 108 (90 BASE) MCG/ACT inhaler Inhale 2 puffs into the lungs every 6 (six) hours as needed for  wheezing or shortness of breath. 05/12/15  Yes Tresa Garter, MD  benzonatate (TESSALON) 100 MG capsule Take 1 to 2 capsules by mouth every 8 hours as needed for cough 05/03/15  Yes Tresa Garter, MD  chlorpheniramine-HYDROcodone (TUSSIONEX PENNKINETIC ER) 10-8 MG/5ML LQCR Take 5 mLs by mouth every 12 (twelve) hours as needed for cough. 11/21/13  Yes Kelvin Cellar, MD  diphenoxylate-atropine (LOMOTIL) 2.5-0.025 MG per tablet Take 2 tablets by mouth 4 (four) times daily as needed for diarrhea or loose stools. 11/02/14  Yes Adrena E Johnson, PA-C  enoxaparin (LOVENOX) 150 MG/ML injection Inject 0.63 mLs (95 mg total) into the skin daily. 06/07/15  Yes Wyatt Portela, MD  fluticasone (FLONASE) 50 MCG/ACT nasal spray Place 2 sprays into both nostrils daily. 09/15/14  Yes Tresa Garter, MD  guaiFENesin (MUCINEX) 600 MG 12 hr tablet Take 600 mg by mouth 2 (two) times daily as needed for cough or to loosen phlegm.    Yes Historical Provider, MD  hydrOXYzine (ATARAX/VISTARIL) 10 MG tablet Take 1 tablet (10 mg total) by mouth 3 (three) times daily as needed for itching. 03/20/15  Yes Adrena E Johnson, PA-C  Lactulose 20 GM/30ML SOLN Take 30 cc by mouth every 4 hours as needed for constipation. Patient taking differently: Take 30 mLs by mouth every 4 (four) hours as needed (for constipation).  11/30/14  Yes Wyatt Portela, MD  loratadine (CLARITIN) 10 MG tablet Take 1 tablet (10 mg total) by mouth daily. Patient taking differently: Take 10 mg by mouth daily as needed for allergies.  10/16/13  Yes Reyne Dumas, MD  megestrol (MEGACE) 400 MG/10ML suspension Take 10 mLs (400 mg total) by mouth 2 (two) times daily. 06/07/15  Yes Wyatt Portela, MD  metoCLOPramide (REGLAN) 10 MG tablet Take 1 tablet (10 mg total) by mouth every 6 (six) hours as needed for nausea or vomiting (nausea/headache). 11/23/14  Yes Orlie Dakin, MD  oxyCODONE (OXY IR/ROXICODONE) 5 MG immediate release tablet Take 1-2 tablets (5-10 mg  total) by mouth every 6 (six) hours as needed for severe pain. 06/07/15  Yes Wyatt Portela, MD  pentoxifylline (TRENTAL) 400 MG CR tablet Take 1 tablet (400 mg total) by mouth 2 (two) times daily. Take 400 units vitamin E twice a day with this medication. 06/02/15  Yes Kyung Rudd, MD  vitamin E 400 UNIT capsule Take 400 Units by mouth 2 (two) times daily.   Yes Historical Provider, MD  doxycycline (VIBRAMYCIN) 100 MG capsule Take 1 capsule (100 mg total) by mouth 2 (two) times daily. Patient not taking: Reported on 06/28/2015 03/02/15   Wyatt Portela, MD  osimertinib mesylate (TAGRISSO) 80 MG tablet Take 1 tablet (80 mg total) by mouth daily. Patient not taking: Reported on 06/29/2015 06/07/15   Wyatt Portela, MD   Allergies  Allergen Reactions  . Amoxicillin-Pot Clavulanate Other (See Comments)    .Marland KitchenHas patient had a PCN reaction causing immediate rash, facial/tongue/throat swelling, SOB or lightheadedness with hypotension: No Has patient had a PCN reaction causing severe rash involving mucus membranes or skin necrosis: No Has patient had a PCN reaction that required hospitalization No Has patient had a PCN reaction occurring within the last 10 years: No If all of the above answers are "NO", then may proceed with Cephalosporin use.   Marland Kitchen Penicillins Rash    .Marland KitchenHas patient had a PCN reaction causing immediate rash, facial/tongue/throat swelling, SOB or lightheadedness with hypotension: No Has patient had a PCN reaction causing severe rash involving mucus membranes or skin necrosis: No Has patient had a PCN reaction that required hospitalization No Has patient had a PCN reaction occurring within the last 10 years: No If all of the above answers are "NO", then may proceed with Cephalosporin use.      FAMILY HISTORY:  indicated that his father is alive.  SOCIAL HISTORY:  reports that he has never smoked. He has never used smokeless tobacco. He reports that he does not drink alcohol or use  illicit drugs.  REVIEW OF SYSTEMS:  On BiPAP, unable to attain.  SUBJECTIVE:   VITAL SIGNS: Temp:  [98.1 F (36.7 C)] 98.1 F (36.7 C) (10/09 0831) Pulse Rate:  [100-119] 119 (10/09 1011) Resp:  [20-37] 32 (10/09 1011) BP: (103-117)/(61-64) 103/62 mmHg (10/09 1011) SpO2:  [64 %-97 %] 97 % (10/09 1011) FiO2 (%):  [35 %] 35 % (10/09 1010) HEMODYNAMICS:   VENTILATOR SETTINGS: Vent Mode:  [-]  FiO2 (%):  [35 %] 35 % INTAKE / OUTPUT: No intake or output data in the 24 hours ending 07/03/2015 1035  PHYSICAL EXAMINATION: General:  Acute on chronically ill appearing patient. Neuro:  Awake and moving all ext to commands. HEENT:  [place]/AT, PERRL, EOM-I and MMM. Cardiovascular:  RRR, Nl S1/S2, -M/R/G. Lungs:  Diffuse crackles, decreased BS on the left. Abdomen:  Soft, NT, ND and +BS. Musculoskeletal:  -edema and -tenderness. Skin:  Intact.  LABS:  CBC  Recent Labs Lab 07/06/2015 0900 06/14/2015 0925  WBC 10.5  --   HGB 13.3 13.6  HCT 37.8* 40.0  PLT 323  --    Coag's  Recent Labs Lab 06/14/2015 0900  APTT 33  INR 1.25   BMET  Recent Labs Lab 06/28/2015 0900 07/07/2015 0925  NA 136 137  K 3.3* 3.3*  CL 106 105  CO2 20*  --   BUN 12 11  CREATININE 0.68 0.60*  GLUCOSE 114* 112*   Electrolytes  Recent Labs Lab 06/24/2015 0900  CALCIUM 8.6*   Sepsis Markers  Recent Labs Lab 06/29/2015 1005  LATICACIDVEN 0.89   ABG No results for input(s): PHART, PCO2ART, PO2ART in the last 168 hours. Liver Enzymes No results for input(s): AST, ALT, ALKPHOS, BILITOT, ALBUMIN in the last 168 hours. Cardiac Enzymes No results for input(s): TROPONINI, PROBNP in the last 168 hours. Glucose No results for input(s): GLUCAP in the last 168 hours.  Imaging Ct Angio Chest Pe W/cm &/or Wo Cm  07/10/2015   CLINICAL DATA:  Shortness of breath and hypoxia. 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 Known left lower extremity DVT.  EXAM: CT ANGIOGRAPHY CHEST WITH CONTRAST   TECHNIQUE: Multidetector CT imaging of the chest was performed using the standard protocol during bolus administration of intravenous contrast. Multiplanar CT image reconstructions and MIPs were obtained to evaluate the vascular anatomy.  CONTRAST:  179m OMNIPAQUE IOHEXOL 350 MG/ML SOLN  COMPARISON:  04/05/2015  FINDINGS: There is acute pulmonary embolism in the right lung with nonocclusive thrombus identified in the distal aspect of the right main pulmonary artery and extending into lower lobe segmental branches. No left-sided pulmonary embolism identified.  There has been dramatic progression of bilateral lung carcinoma with increase in size of the dominant confluent mass within the posterior left upper lobe now measuring 7.4 cm in greatest diameter and spanning across the entire width of the left upper lobe. There also is dramatic progression in tumor burden in the lingula and left lower lobe as well as throughout all lobes of the right lung with findings consistent with diffuse lymphangitic and hematologic spread throughout the lungs bilaterally. There is no evidence of airway obstruction.  No pleural or pericardial fluid is identified. Mediastinal lymphadenopathy is relatively stable with the largest node measuring 13 mm in short axis in the lower right paratracheal and precarinal region.  The visualized upper abdomen also shows significant progression of metastatic disease in the liver with tremendously more lesions identified throughout both lobes. The largest visualized lesion is in the medial and inferior left lobe measuring approximately 3.3 x 4.2 cm. This lesion previously only measured approximately 9 mm in July. There also is visible diffuse carcinomatosis in the subphrenic fat of the left upper quadrant adjacent to the stomach and splenic flexure of the colon. This also extends to abut the left lobe of the liver anteriorly and there is a small amount of ascites surrounding the liver.  Multiple  sclerotic metastatic lesions of the vertebral bodies again noted. T6 and T7 metastatic lesions appear relatively stable. Posterior T9 lesion appears slightly larger. T10, T11, T12 and L1 lesions appear fairly stable. There is no evidence of compression fracture or visible tumor in the spinal canal. Metastatic disease involving both scapulae appears stable. There is probable metastatic disease involving the right fourth, seventh, eighth and potentially ninth ribs without evidence of pathologic fracture. Tiny sclerotic lesion is suspected in the left fifth rib.  Review of the MIP images confirms the above findings.  IMPRESSION: 1. Acute pulmonary embolism with nonocclusive thrombus identified in the right pulmonary arteries beginning in the  distal right main pulmonary artery and extending into lower lobe branches. 2. Marked progression of metastatic adenocarcinoma throughout both lungs and in the liver. 3. Relatively stable metastatic disease involving the spine, bilateral ribs and both scapulae. There may be slight enlargement of a T9 metastatic lesion. 4. Evidence of progressive carcinomatosis in the subphrenic fat of the left upper quadrant with tumor also identified adjacent to the left lobe of the liver. Associated small amount of ascites is seen around the visualized liver. These results were called by telephone at the time of interpretation on 06/20/2015 at 10:10 am to Dr. Deno Etienne , who verbally acknowledged these results.   Electronically Signed   By: Aletta Edouard M.D.   On: 07/02/2015 10:12   Dg Chest Port 1 View  06/13/2015   CLINICAL DATA:  Worsening shortness of breath.  EXAM: PORTABLE CHEST 1 VIEW  COMPARISON:  CT of the chest dated 04/05/2015  FINDINGS: Cardiomediastinal silhouette is largely obscured by innumerable dense miliary pattern bilateral pulmonary nodules with confluent appearance in the midlung fields. The aeration of the lungs demonstrates significant worsening, when compared to  04/05/2015 chest CT. There is no evidence of pneumothorax or large pleural effusions.  Osseous structures are without acute abnormality. Soft tissues are grossly normal.  IMPRESSION: Significant worsening of the aeration of the lungs with now bilateral confluent miliary pattern innumerable pulmonary nodules.   Electronically Signed   By: Fidela Salisbury M.D.   On: 06/13/2015 09:27     ASSESSMENT / PLAN:  PULMONARY OETT None A: Acute respiratory failure, advancing cancer and new PE. P:   - Full anticoag with heparin per pharmacy. - Will need to discuss oral anti-coagulants later if patient improves. - Check lower ext dopplers, ?IVC filter placement. - BiPAP for comfort. - Need to discuss code status. - Treat as HCAP and PCP (actively getting chemo).  CARDIOVASCULAR CVL PIV A: New PE. P:  - No indication for lytics at this time. - Hold home anti-HTN.  RENAL A:  Hypokalemia. P:   - BMET in AM. - Replace electrolytes as indicated. - NS 100 ml/hr.  GASTROINTESTINAL A:  No active issues. P:   - NPO while BiPAP is needed. - PPI.  HEMATOLOGIC A:  Stage 4 lung cancer on chemo. P:  - Need to discuss EOL. - CBC in AM. - Transfuse per ICU protocol.  INFECTIOUS A:  WBC is normal but right lung has infiltrate and patient is on chemo. P:   BCx2 10/9>>> UC 10/9>>> Sputum 10/9>>>  Vanc 10/9>>> Zosyn 10/9>>> Bactrim 10/9>>>  PCT. Check LDH, if negative would consider d/c bactrim. Sputum stain for PCP ordered.  ENDOCRINE A:  No active issues.   P:   - Monitor.  NEUROLOGIC A:  No active issues. P:   - Avoid sedation.  FAMILY  - Updates: Spoke with patient and wife extensively through a Electronics engineer.  After prolonged discussion, they understood that patient is critically ill and if he is to deteriorate any further then will need intubation.  He is ok with intubation for a short period only and he has been made aware that his chances of coming off life support  are very slim if he is to go on it.  Evidently nobody has discussed his terminal illness with him until now.  The patient is critically ill with multiple organ systems failure and requires high complexity decision making for assessment and support, frequent evaluation and titration of therapies, application of advanced monitoring technologies  and extensive interpretation of multiple databases.   Critical Care Time devoted to patient care services described in this note is  90  Minutes. This time reflects time of care of this signee Dr Jennet Maduro. This critical care time does not reflect procedure time, or teaching time or supervisory time of PA/NP/Med student/Med Resident etc but could involve care discussion time.  Rush Farmer, M.D. [facility] Pulmonary/Critical Care Medicine. Pager: [phone]. After hours pager: [phone].  06/22/2015, 10:35 AM

## 2015-06-18 NOTE — Progress Notes (Signed)
CCM contacted and updated about patient condition. Pt HR remains in 130's ST and Oxygen saturation in mid to low 80's despite being on 100% FIO2. Orders given. Will continue to monitor.

## 2015-06-18 NOTE — Procedures (Signed)
Intubation Procedure Note Chad George [id number] October 05, 1970  Procedure: Intubation Indications: Respiratory insufficiency  Procedure Details Consent: Risks of procedure as well as the alternatives and risks of each were explained to the (patient/caregiver).  Consent for procedure obtained. Time Out: Verified patient identification, verified procedure, site/side was marked, verified correct patient position, special equipment/implants available, medications/allergies/relevent history reviewed, required imaging and test results available.  Performed  Maximum sterile technique was used including cap, gloves, gown and hand hygiene.  MAC and 4    Evaluation Hemodynamic Status: BP stable throughout; O2 sats: stable throughout Patient's Current Condition: stable Complications: No apparent complications Patient did tolerate procedure well. Chest X-ray ordered to verify placement.  CXR: pending.   Chad George 06/14/2015  Explained may never come off vent Oncology per pt, never discussed life support and code status  Lavon Paganini. Titus Mould, MD, [place] Pgr: [place] [facility]

## 2015-06-18 NOTE — Progress Notes (Signed)
Pt having difficult time maintaing O2 sat despite Bipap.  Pt Respirations at 41 at o2 sat at 88-91%  Informed Elink "Dr Tamala Julian" and respiratory.  Irven [place], RN

## 2015-06-18 NOTE — Progress Notes (Signed)
eLink Physician-Brief Progress Note Patient Name: Chad George DOB: 04/24/1971 MRN: [id number]   Date of Service  06/20/2015  HPI/Events of Note  44 yr old male with metastatic lung ca with significant lung involvement presents with acute PE.  He is now having increasing work of breathing.  He has a very poor prognosis.  This was discussed with patient and family.  Patient and family still wish for intubation.  eICU Interventions  Endotracheal intubation     Intervention Category Major Interventions: Respiratory failure - evaluation and management  Mauri Brooklyn, P 07/01/2015, 5:02 PM

## 2015-06-18 NOTE — Progress Notes (Signed)
ANTICOAGULATION CONSULT NOTE - Initial Consult  Pharmacy Consult for Heparin Indication: pulmonary embolus and DVT  Allergies  Allergen Reactions  . Amoxicillin-Pot Clavulanate Other (See Comments)    .Marland KitchenHas patient had a PCN reaction causing immediate rash, facial/tongue/throat swelling, SOB or lightheadedness with hypotension: No Has patient had a PCN reaction causing severe rash involving mucus membranes or skin necrosis: No Has patient had a PCN reaction that required hospitalization No Has patient had a PCN reaction occurring within the last 10 years: No If all of the above answers are "NO", then may proceed with Cephalosporin use.   Marland Kitchen Penicillins Rash    .Marland KitchenHas patient had a PCN reaction causing immediate rash, facial/tongue/throat swelling, SOB or lightheadedness with hypotension: No Has patient had a PCN reaction causing severe rash involving mucus membranes or skin necrosis: No Has patient had a PCN reaction that required hospitalization No Has patient had a PCN reaction occurring within the last 10 years: No If all of the above answers are "NO", then may proceed with Cephalosporin use.      Patient Measurements:   Last documented weight 62.8 kg (06/07/15) IBW 63.8 kg  Vital Signs: Temp: 98.1 F (36.7 C) (10/09 0831) Temp Source: Oral (10/09 0831) BP: 103/62 mmHg (10/09 1010) Pulse Rate: 113 (10/09 1010)  Labs:  Recent Labs  06/17/2015 0900 06/27/2015 0925  HGB 13.3 13.6  HCT 37.8* 40.0  PLT 323  --   APTT 33  --   LABPROT 15.8*  --   INR 1.25  --   CREATININE 0.68 0.60*    Estimated Creatinine Clearance: 105.8 mL/min (by C-G formula based on Cr of 0.6).   Medical History: Past Medical History  Diagnosis Date  . High cholesterol   . Pneumonia   . Allergy   . S/P radiation therapy 10/19/14-11/08/14    brain mets  . Adenocarcinoma ([place]) 11/21/2013  . Brain cancer ([place]) 10/10/14 MRI    40 lesions mets  . S/P radiation therapy 03/27/15    L-spine 29f/30GY     Medications:   (Not in a hospital admission) Infusions:  . cefTAZidime (FORTAZ)  IV 2 g (07/10/2015 1004)  . vancomycin      Assessment: 89yoM presents to ED with SOB, cough w/ sputum, pain, and hypoxia. PMH significant for lung cancer with mets to brain currently taking oral chemo Gilotrif, and recent diagnosis of DVT in LLE (06/08/15) started on Lovenox. Hypoxia concerning for PE, CT angio confirms acute PE with nonocclusive thrombus in right pulmonary arteries.  His last PTA Lovenox dose was given on 10/8 at 1200. Pharmacy is consulted to dose Heparin IV.  Today, 07/08/2015: Baseline coags: INR 1.25, APTT 33, HL 0.11 CBC: Hgb and Plt WNL SCr 0.6 with CrCl > 100 ml/min  Goal of Therapy:  Heparin level 0.3-0.7 units/ml Monitor platelets by anticoagulation protocol: Yes   Plan:   Baseline PTT, PT/INR, HL  Give heparin 2000 units bolus IV x 1  Start heparin IV infusion at 1250 units/hr  Heparin level 6 hours after starting  Daily heparin level and CBC  Continue to monitor H&H and platelets  CGretta ArabPharmD, BCPS Pager [phone]/05/2015 10:20 AM

## 2015-06-18 NOTE — ED Notes (Signed)
Pt was noted to have red blisters to R forearm, L side of face, back of neck and R axilla. All infusions stopped and Dr Tyrone Nine notified. Dr Nelda Marseille at bedside

## 2015-06-18 NOTE — ED Notes (Signed)
Respiratory at bedside with Bipap

## 2015-06-18 NOTE — Progress Notes (Signed)
ANTICOAGULATION CONSULT NOTE - Follow Up  Pharmacy Consult for Heparin Indication: pulmonary embolus and DVT  Please see previous pharmacist note for full details.  First heparin level = 0.31 on 1250 units/hr which is at low end of therapeutic range (0.3 - 0.7 units/ml)  Plan:  Continue current rate  Recheck heparin level in 6hr to verify therapeutic  Peggyann Juba, PharmD, BCPS Pager: [phone] 07/10/2015 6:06 PM

## 2015-06-18 NOTE — Progress Notes (Addendum)
ANTIBIOTIC CONSULT NOTE - INITIAL  Pharmacy Consult for Ceftazidime, Vancomycin, Bactrim Indication: rule out pneumonia  Allergies  Allergen Reactions  . Amoxicillin-Pot Clavulanate Other (See Comments)    .Marland KitchenHas patient had a PCN reaction causing immediate rash, facial/tongue/throat swelling, SOB or lightheadedness with hypotension: No Has patient had a PCN reaction causing severe rash involving mucus membranes or skin necrosis: No Has patient had a PCN reaction that required hospitalization No Has patient had a PCN reaction occurring within the last 10 years: No If all of the above answers are "NO", then may proceed with Cephalosporin use.   Marland Kitchen Penicillins Rash    .Marland KitchenHas patient had a PCN reaction causing immediate rash, facial/tongue/throat swelling, SOB or lightheadedness with hypotension: No Has patient had a PCN reaction causing severe rash involving mucus membranes or skin necrosis: No Has patient had a PCN reaction that required hospitalization No Has patient had a PCN reaction occurring within the last 10 years: No If all of the above answers are "NO", then may proceed with Cephalosporin use.      Patient Measurements:   Last documented weight 62.8 kg (06/07/15)  Vital Signs: Temp: 98.1 F (36.7 C) (10/09 0831) Temp Source: Oral (10/09 0831) BP: 117/61 mmHg (10/09 0842) Pulse Rate: 100 (10/09 0930) Intake/Output from previous day:   Intake/Output from this shift:    Labs:  Recent Labs  07/05/2015 0900 06/21/2015 0925  WBC 10.5  --   HGB 13.3 13.6  PLT 323  --   CREATININE 0.68 0.60*   Estimated Creatinine Clearance: 105.8 mL/min (by C-G formula based on Cr of 0.6). No results for input(s): VANCOTROUGH, VANCOPEAK, VANCORANDOM, GENTTROUGH, GENTPEAK, GENTRANDOM, TOBRATROUGH, TOBRAPEAK, TOBRARND, AMIKACINPEAK, AMIKACINTROU, AMIKACIN in the last 72 hours.   Microbiology: No results found for this or any previous visit (from the past 720 hour(s)).  Medical  History: Past Medical History  Diagnosis Date  . High cholesterol   . Pneumonia   . Allergy   . S/P radiation therapy 10/19/14-11/08/14    brain mets  . Adenocarcinoma ([place]) 11/21/2013  . Brain cancer ([place]) 10/10/14 MRI    40 lesions mets  . S/P radiation therapy 03/27/15    L-spine 49f/30GY    Assessment: 89yoM presents to ED with SOB, cough w/ sputum, pain, and hypoxia.  PMH significant for lung cancer with mets to brain currently taking oral chemo Gilotrif, and recent diagnosis of DVT in LLE (06/08/15) started on Lovenox.  Hypoxia concerning for PE, CT scan pending.  MD ordered vancomycin x1 dose in ED.  Pharmacy is consulted to dose ceftazidime for possible pneumonia.  PCN allergy (rash) is notes, but low risk for cephalosporin use.  Today, 06/20/2015: Tm 98.1 WBC 10.5 SCr 0.6 with CrCl > 100 ml/min   Goal of Therapy:  Vancomycin trough level 15-20 mcg/ml  Appropriate abx dosing, eradication of infection.   Plan:  Ceftazidime 2g IV q8h Vancomycin 1250 mg IV once per MD - f/u plans for continuation. Follow up renal fxn, culture results, and clinical course.  CGretta ArabPharmD, BCPS Pager [phone]/05/2015 9:54 AM   Addendum: On admission, pharmacy is consulted to dose Vancomycin, Ceftazidime, and Bactrim for HCAP and r/o PCP.  Note:  ED RN SManuela Schwartzreports that the patient had allergic reaction symptoms of redness and blisters at ~12:00.  Benadryl IV was given.  Ceftazidime infusion (10:04-10:30) had already been completed.   Vanc was started at 10:30, and had almost completely infused over the 90 min duration.  Heparin  infusion was started at about 11:00.   Dr. Nelda Marseille has examined the patient and does not believe this is a true allergic reaction.  He would like to proceed with the above antibiotics but with slower Vancomycin infusion.  Plan:  Continue Ceftazidime 2g IV q8h  Bactrim '400mg'$  TMP IV q8h  Vancomycin 1g IV q8h - infuse over 120 minutes, premedicate with  Benadryl '25mg'$  PO prior to each dose.  Measure Vanc trough at steady state.  Follow up renal fxn, culture results, and clinical course.  Monitor closely for further infusion reactions.  Gretta Arab PharmD, BCPS Pager [phone] 07/05/2015 1:28 PM

## 2015-06-18 NOTE — Progress Notes (Signed)
RT performed recruitment maneuver, pt tolerated fairly well. O2 sats came up to 86-88%

## 2015-06-19 ENCOUNTER — Inpatient Hospital Stay (HOSPITAL_COMMUNITY): Payer: Medicaid Other

## 2015-06-19 DIAGNOSIS — I2699 Other pulmonary embolism without acute cor pulmonale: Secondary | ICD-10-CM | POA: Insufficient documentation

## 2015-06-19 DIAGNOSIS — C801 Malignant (primary) neoplasm, unspecified: Secondary | ICD-10-CM

## 2015-06-19 DIAGNOSIS — E440 Moderate protein-calorie malnutrition: Secondary | ICD-10-CM | POA: Insufficient documentation

## 2015-06-19 DIAGNOSIS — J9600 Acute respiratory failure, unspecified whether with hypoxia or hypercapnia: Secondary | ICD-10-CM

## 2015-06-19 LAB — BLOOD GAS, ARTERIAL
Acid-base deficit: 5.2 mmol/L — ABNORMAL HIGH (ref 0.0–2.0)
Bicarbonate: 19.7 mEq/L — ABNORMAL LOW (ref 20.0–24.0)
DRAWN BY: 418751
FIO2: 1
MECHVT: 380 mL
O2 SAT: 97.5 %
PEEP: 20 cmH2O
PH ART: 7.332 — AB (ref 7.350–7.450)
Patient temperature: 98.7
RATE: 20 resp/min
TCO2: 18.1 mmol/L (ref 0–100)
pCO2 arterial: 38.2 mmHg (ref 35.0–45.0)
pO2, Arterial: 109 mmHg — ABNORMAL HIGH (ref 80.0–100.0)

## 2015-06-19 LAB — CBC
HCT: 34.2 % — ABNORMAL LOW (ref 39.0–52.0)
HEMOGLOBIN: 11.6 g/dL — AB (ref 13.0–17.0)
MCH: 30.1 pg (ref 26.0–34.0)
MCHC: 33.9 g/dL (ref 30.0–36.0)
MCV: 88.6 fL (ref 78.0–100.0)
PLATELETS: 301 10*3/uL (ref 150–400)
RBC: 3.86 MIL/uL — ABNORMAL LOW (ref 4.22–5.81)
RDW: 12.8 % (ref 11.5–15.5)
WBC: 10.6 10*3/uL — ABNORMAL HIGH (ref 4.0–10.5)

## 2015-06-19 LAB — HEPARIN LEVEL (UNFRACTIONATED)
HEPARIN UNFRACTIONATED: 0.37 [IU]/mL (ref 0.30–0.70)
Heparin Unfractionated: 0.31 IU/mL (ref 0.30–0.70)

## 2015-06-19 LAB — BASIC METABOLIC PANEL
Anion gap: 6 (ref 5–15)
BUN: 12 mg/dL (ref 6–20)
CALCIUM: 8.2 mg/dL — AB (ref 8.9–10.3)
CHLORIDE: 110 mmol/L (ref 101–111)
CO2: 20 mmol/L — ABNORMAL LOW (ref 22–32)
CREATININE: 0.69 mg/dL (ref 0.61–1.24)
Glucose, Bld: 147 mg/dL — ABNORMAL HIGH (ref 65–99)
Potassium: 4.3 mmol/L (ref 3.5–5.1)
SODIUM: 136 mmol/L (ref 135–145)

## 2015-06-19 LAB — PHOSPHORUS: PHOSPHORUS: 4.3 mg/dL (ref 2.5–4.6)

## 2015-06-19 LAB — INFLUENZA PANEL BY PCR (TYPE A & B)
H1N1FLUPCR: NOT DETECTED
INFLAPCR: NEGATIVE
INFLBPCR: NEGATIVE

## 2015-06-19 LAB — URINE CULTURE: CULTURE: NO GROWTH

## 2015-06-19 LAB — TRIGLYCERIDES: Triglycerides: 121 mg/dL (ref <150)

## 2015-06-19 LAB — MAGNESIUM: MAGNESIUM: 2.1 mg/dL (ref 1.7–2.4)

## 2015-06-19 NOTE — Progress Notes (Signed)
Initial Nutrition Assessment  DOCUMENTATION CODES:   Non-severe (moderate) malnutrition in context of acute illness/injury  INTERVENTION:  - If TF within plan of care, GOC, recommend Vital AF 1.2 @ 40 mL/hr with 30 mL Prostat once/day. This, with current Propfol kcal, will provide 1717 kcal, 87 grams protein, and 779 mL free water. - RD will continue to monitor for needs  NUTRITION DIAGNOSIS:   Inadequate oral intake related to inability to eat as evidenced by NPO status.  GOAL:   Patient will meet greater than or equal to 90% of their needs  MONITOR:   Vent status, Weight trends, Labs, Skin, I & O's  REASON FOR ASSESSMENT:   Malnutrition Screening Tool, Ventilator  ASSESSMENT:   44 year old male with PMH of stage 4 lung cancer and DVT on lovenox presenting with acute SOB. CTA revealed a new PE and right sided pulmonary infiltrate. Patient was placed on BiPAP and PCCM was called to admit. Reports cough but no sputum production.  Pt seen for MST and new vent. BMI indicates normal weight status. Patient is currently intubated on ventilator support MV: 8.5 L/min Temp (24hrs), Avg:98 F (36.7 C), Min:97.3 F (36.3 C), Max:98.7 F (37.1 C)  Propofol: 17.6 ml/hr (465 kcal)  Wife in room and is mainly Spanish speaking only; another woman in the room able to translate. Per this the following information was obtained. Pt has had a poor appetite for "a long time" which when asked for further clarification wife states has been at least a few months. She states that pt has lost a significant amount of weight but unable to specify the amount of weight loss. She does feel that weight loss has occurred over the past few months consistent with poor intakes. Per chart review, pt has lost 2 lbs (1.4% body weight) in the past 1 month which is not significant for time frame.  Mild muscle wasting noted to temple and shoulder.   Wife is concerned about pt being unable to eat PO while  intubated. Conveyed to her that MD or NP would further discuss current medical course with her. Did inform her briefly that TF may be used and explained to her, in simple terms, what TF is.   Pt not meeting needs; TF recommendations, with current Propofol rate, outlined above should it be needed. Medications reviewed. Labs reviewed; Ca: 8.2 mg/dL.  Drips: Fentanyl @ 150 mcg/hr, Propofol @ 17.6 mL/hr, Heparin @ 1250 units/hr.    Diet Order:  Diet NPO time specified  Skin:  Reviewed, no issues  Last BM:  PTA  Height:   Ht Readings from Last 1 Encounters:  06/20/2015 '5\' 6"'$  (1.676 m)    Weight:   Wt Readings from Last 1 Encounters:  06/19/15 138 lb 0.1 oz (62.6 kg)    Ideal Body Weight:  64.54 kg (kg)  BMI:  Body mass index is 22.29 kg/(m^2).  Estimated Nutritional Needs:   Kcal:  1655  Protein:  75-94 grams  Fluid:  2 L/day  EDUCATION NEEDS:   No education needs identified at this time     Jarome Matin, RD, LDN Inpatient Clinical Dietitian Pager # [phone] After hours/weekend pager # [phone]

## 2015-06-19 NOTE — Progress Notes (Signed)
ANTICOAGULATION CONSULT NOTE - Follow Up  Pharmacy Consult for Heparin Indication: pulmonary embolus and DVT  Please see previous pharmacist note for full details.  Repeat heparin level = 0.31 on 1250 units/hr (Goal 0.3 - 0.7 units/ml)  Plan:  Continue current rate  Follow heparin level & CBC daily  Leone Haven, PharmD  06/19/2015 1:21 AM

## 2015-06-19 NOTE — Progress Notes (Signed)
Patient continues with irregular, asymmetrical respiations with peak airway pressures varying between 23-45. Attempted match patient's rate demand but still stacking breaths. Patient continues at moderate sedation with fentanyl and diprovan.

## 2015-06-19 NOTE — Progress Notes (Signed)
Echocardiogram 2D Echocardiogram has been performed.  Chad George 06/19/2015, 9:53 AM

## 2015-06-19 NOTE — Care Management Note (Signed)
Case Management Note  Patient Details  Name: Chad George MRN: [id number] Date of Birth: [birth year]  Subjective/Objective:          Resp. failure          Action/Plan:Date:  Oct. 10, 2016 U.R. performed for needs and level of care. Will continue to follow for Case Management needs.  Velva Harman, RN, BSN, [place]   [phone]    Expected Discharge Date:                  Expected Discharge Plan:  Home/Self Care  In-House Referral:  NA  Discharge planning Services  CM Consult  Post Acute Care Choice:  NA Choice offered to:  NA  DME Arranged:    DME Agency:     HH Arranged:    HH Agency:     Status of Service:  In process, will continue to follow  Medicare Important Message Given:    Date Medicare IM Given:    Medicare IM give by:    Date Additional Medicare IM Given:    Additional Medicare Important Message give by:     If discussed at [place] of Stay Meetings, dates discussed:    Additional Comments:  Leeroy Cha, RN 06/19/2015, 10:01 AM

## 2015-06-19 NOTE — Progress Notes (Signed)
PULMONARY / CRITICAL CARE MEDICINE   Name: Chad George MRN: [id number] DOB: 07-04-1971    ADMISSION DATE:  06/24/2015 CONSULTATION DATE:  07/06/2015  REFERRING MD :  EDP  CHIEF COMPLAINT:  PE and acute respiratory failure  INITIAL PRESENTATION: 44 year old male with PMH of stage 4 lung cancer and DVT on lovenox presenting with acute SOB.  CTA revealed a new PE and right sided pulmonary infiltrate.  Patient was placed on BiPAP and PCCM was called to admit.  Reports cough but no sputum production.  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  SIGNIFICANT EVENTS: 10/9 Admission and BiPAP for respiratory failure. Intubated.  10/10 FIO2 requirements high  SUBJECTIVE:   VITAL SIGNS: Temp:  [97.3 F (36.3 C)-98.7 F (37.1 C)] 97.3 F (36.3 C) (10/10 0800) Pulse Rate:  [88-146] 94 (10/10 0900) Resp:  [13-38] 13 (10/10 0900) BP: (83-127)/(48-71) 86/60 mmHg (10/10 0900) SpO2:  [74 %-100 %] 90 % (10/10 0900) FiO2 (%):  [35 %-100 %] 90 % (10/10 0843) Weight:  [59.4 kg (130 lb 15.3 oz)-62.6 kg (138 lb 0.1 oz)] 62.6 kg (138 lb 0.1 oz) (10/10 0400) HEMODYNAMICS:   VENTILATOR SETTINGS: Vent Mode:  [-] PRVC FiO2 (%):  [35 %-100 %] 90 % Set Rate:  [20 bmp-24 bmp] 24 bmp Vt Set:  [380 mL-560 mL] 380 mL PEEP:  [5 cmH20-20 cmH20] 18 cmH20 Plateau Pressure:  [17 cmH20-40 cmH20] 40 cmH20 INTAKE / OUTPUT:  Intake/Output Summary (Last 24 hours) at 06/19/15 0946 Last data filed at 06/19/15 0800  Gross per 24 hour  Intake 4553.47 ml  Output    250 ml  Net 4303.47 ml    PHYSICAL EXAMINATION: General:  Acute on chronically ill appearing patient. Neuro:  Sedated on vent, moves  all ext to commands. HEENT:  [place]/AT, PERRL, EOM-I and MMM. Cardiovascular:  RRR, Nl S1/S2, -M/R/G. Lungs:  Diffuse crackles, decreased BS on the left. Abdomen:  Soft, NT, ND and +BS. Musculoskeletal:  -edema and -tenderness. Skin:   Intact.  LABS:  CBC  Recent Labs Lab 07/05/2015 0900 06/17/2015 0925 06/28/2015 1332 06/19/15 0350  WBC 10.5  --  10.6* 10.6*  HGB 13.3 13.6 12.9* 11.6*  HCT 37.8* 40.0 36.5* 34.2*  PLT 323  --  299 301   Coag's  Recent Labs Lab 06/10/2015 0900 07/03/2015 1332  APTT 33 50*  INR 1.25 1.34   BMET  Recent Labs Lab 06/10/2015 0900 06/19/2015 0925 06/22/2015 1332 06/19/15 0350  NA 136 137 137 136  K 3.3* 3.3* 3.6 4.3  CL 106 105 110 110  CO2 20*  --  19* 20*  BUN '12 11 10 12  '$ CREATININE 0.68 0.60* 0.53* 0.69  GLUCOSE 114* 112* 98 147*   Electrolytes  Recent Labs Lab 06/16/2015 0900 06/29/2015 1332 06/19/15 0350  CALCIUM 8.6* 8.3* 8.2*  MG  --  1.8 2.1  PHOS  --  4.4 4.3   Sepsis Markers  Recent Labs Lab 07/02/2015 1005 06/25/2015 1253  LATICACIDVEN 0.89 0.52   ABG  Recent Labs Lab 06/22/2015 1129 06/22/2015 1935 06/10/2015 2320  PHART 7.416 7.230* 7.297*  PCO2ART 29.8* 49.8* 41.3  PO2ART 153* 90.5 108*   Liver Enzymes  Recent Labs Lab 06/26/2015 1332  AST 39  ALT 33  ALKPHOS 138*  BILITOT 0.8  ALBUMIN 2.5*   Cardiac Enzymes No results for input(s): TROPONINI, PROBNP in the last 168 hours. Glucose No results for input(s): GLUCAP in  the last 168 hours.  Imaging Ct Angio Chest Pe W/cm &/or Wo Cm  07/09/2015   CLINICAL DATA:  Shortness of breath and hypoxia. 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 Known left lower extremity DVT.  EXAM: CT ANGIOGRAPHY CHEST WITH CONTRAST  TECHNIQUE: Multidetector CT imaging of the chest was performed using the standard protocol during bolus administration of intravenous contrast. Multiplanar CT image reconstructions and MIPs were obtained to evaluate the vascular anatomy.  CONTRAST:  172m OMNIPAQUE IOHEXOL 350 MG/ML SOLN  COMPARISON:  04/05/2015  FINDINGS: There is acute pulmonary embolism in the right lung with nonocclusive thrombus identified in the distal aspect of the right main pulmonary artery and extending  into lower lobe segmental branches. No left-sided pulmonary embolism identified.  There has been dramatic progression of bilateral lung carcinoma with increase in size of the dominant confluent mass within the posterior left upper lobe now measuring 7.4 cm in greatest diameter and spanning across the entire width of the left upper lobe. There also is dramatic progression in tumor burden in the lingula and left lower lobe as well as throughout all lobes of the right lung with findings consistent with diffuse lymphangitic and hematologic spread throughout the lungs bilaterally. There is no evidence of airway obstruction.  No pleural or pericardial fluid is identified. Mediastinal lymphadenopathy is relatively stable with the largest node measuring 13 mm in short axis in the lower right paratracheal and precarinal region.  The visualized upper abdomen also shows significant progression of metastatic disease in the liver with tremendously more lesions identified throughout both lobes. The largest visualized lesion is in the medial and inferior left lobe measuring approximately 3.3 x 4.2 cm. This lesion previously only measured approximately 9 mm in July. There also is visible diffuse carcinomatosis in the subphrenic fat of the left upper quadrant adjacent to the stomach and splenic flexure of the colon. This also extends to abut the left lobe of the liver anteriorly and there is a small amount of ascites surrounding the liver.  Multiple sclerotic metastatic lesions of the vertebral bodies again noted. T6 and T7 metastatic lesions appear relatively stable. Posterior T9 lesion appears slightly larger. T10, T11, T12 and L1 lesions appear fairly stable. There is no evidence of compression fracture or visible tumor in the spinal canal. Metastatic disease involving both scapulae appears stable. There is probable metastatic disease involving the right fourth, seventh, eighth and potentially ninth ribs without evidence of  pathologic fracture. Tiny sclerotic lesion is suspected in the left fifth rib.  Review of the MIP images confirms the above findings.  IMPRESSION: 1. Acute pulmonary embolism with nonocclusive thrombus identified in the right pulmonary arteries beginning in the distal right main pulmonary artery and extending into lower lobe branches. 2. Marked progression of metastatic adenocarcinoma throughout both lungs and in the liver. 3. Relatively stable metastatic disease involving the spine, bilateral ribs and both scapulae. There may be slight enlargement of a T9 metastatic lesion. 4. Evidence of progressive carcinomatosis in the subphrenic fat of the left upper quadrant with tumor also identified adjacent to the left lobe of the liver. Associated small amount of ascites is seen around the visualized liver. These results were called by telephone at the time of interpretation on 06/30/2015 at 10:10 am to Dr. DDeno Etienne, who verbally acknowledged these results.   Electronically Signed   By: GAletta EdouardM.D.   On: 07/10/2015 10:12   Dg Chest Port 1 View  06/19/2015  CLINICAL DATA:  Endotracheal tube due to respiratory insufficiency. Elevated heart rate with low oxygen saturation. Bilateral lung cancer with Mets to brain.  EXAM: PORTABLE CHEST 1 VIEW  COMPARISON:  06/21/2015.  CT chest 06/22/2015.  FINDINGS: Interval placement of an endotracheal tube with tip measuring 5.9 cm above the carinal. Enteric tube is been placed. Tip is off the field of view but is below the left hemidiaphragm. Normal heart size. Persistent pattern of diffuse nodular airspace disease throughout both lungs. No significant change in appearance since previous study. No blunting of costophrenic angles. No pneumothorax.  IMPRESSION: Unchanged bilateral diffuse nodular airspace disease throughout both lungs. Endotracheal tube placed with tip measuring 5.9 cm above the carina.   Electronically Signed   By: Lucienne Capers M.D.   On: 06/19/2015  06:01   Dg Chest Port 1 View  06/21/2015   CLINICAL DATA:  44 year old male with history of ARDS status post intubation.  EXAM: PORTABLE CHEST 1 VIEW  COMPARISON:  Chest x-ray 06/27/2015.  FINDINGS: Patient is intubated, with the tip of the endotracheal tube approximately 3 cm above the carina. Nasogastric tube extends into the stomach, but tip extends below the lower margin of the image. Diffuse reticulonodular densities throughout the lungs bilaterally, similar to the prior examination. Increasing ill-defined airspace consolidation noted throughout the lungs bilaterally (left greater than right), particularly in the left upper lobe. No definite pleural effusions. In the left mid lung there is a nodular appearing density with central lucency, which could represent a developing abscess or cavity. No definite pneumothorax. Heart size appears normal. Mediastinal contours are distorted by positioning and largely obscured by overlying opacities.  IMPRESSION: 1. Worsening multifocal airspace consolidation superimposed upon a background of reticulonodular disease in lungs, compatible with the patient's reported clinical history of worsening ARDS. Background of nodularity may reflect widespread metastatic disease in this patient with history of lung cancer. Alternatively, given the apparent cavitation, the possibility of worsening multifocal infection or septic embolization should be considered.   Electronically Signed   By: Vinnie Langton M.D.   On: 06/24/2015 18:46  worsening diffuse airspace disease. Possible cavitation developing on left.    ASSESSMENT / PLAN:  PULMONARY OETT None A:  Acute respiratory failure, advancing cancer w/ diffuse lymphangitic spread and new PE. Possible HCAP vs opportunistic infection (less likely) P:   - full vent support/ARDS protocol  - Full anticoag with heparin per pharmacy. - Need to discuss code status. - Treat as HCAP and PCP (actively getting chemo). - no further  escalation  CARDIOVASCULAR CVL PIV A:  New PE. P:  - No indication for lytics at this time. - Hold home anti-HTN.  RENAL A:  Hypokalemia. P:   - BMET in AM. - Replace electrolytes as indicated. - NS 100 ml/hr.  GASTROINTESTINAL A:   No active issues. P:   - NPO while BiPAP is needed. - PPI.  HEMATOLOGIC A:   Stage 4 lung cancer on chemo. DVT/PE P:  - Need to discuss EOL. - CBC in AM. - Transfuse per ICU protocol.  INFECTIOUS A:   WBC is normal but right lung has infiltrate and patient is on chemo. P:   BCx2 10/9>>> UC 10/9>>> Sputum 10/9>>> PCP 10/9>>>  Vanc 10/9>>> Zosyn 10/9>>> Bactrim 10/9>>>   ENDOCRINE A:  No active issues.   P:   - Monitor.  NEUROLOGIC A:  No active issues. P:   -PAD protocol   FAMILY  Spoke w/ family. They understand nothing else to  add. Made him DNR. Will cont current supportive care, no escalation. Have asked social work to assist w/ getting family from [place] here. Likely w/d mid-week if no improvement.   Erick Colace ACNP-BC [place] Pager # [phone] OR # [phone] if no answer 06/19/2015, 9:46 AM   Attending Note:  I have examined patient, reviewed labs, studies and notes. I have discussed the case with Jerrye Bushy, and I agree with the data and plans as amended above. Stage IV lung CA with acute PE, bilateral infiltrates and acute respiratory failure. Sedated and intubated, on high Fio2 and PEEP on my eval. Will plan better sedation, goal support through possible PNA anad ARDS, treat PE. Hopefully there is a reversible component here but I am concerned that he will not recover. We will continue current support without escalation, will reassess progress in the next several days. If he does not improve then we will discuss possible withdrawal of MV. Independent critical care time is 50 minutes.   Baltazar Apo, MD, PhD 06/19/2015, 4:02 PM [facility] [phone] or if no  answer [phone]

## 2015-06-19 NOTE — Progress Notes (Signed)
CSW assisting with contatcint [place] consulate. Pt family requesting pt parents and sister to come to [place] to see patient. NP assisted with letter. CSW contacted consulate and left message for Darin Engels [phone].   Belia Heman, [place] Work  [employer] [phone]

## 2015-06-20 LAB — BASIC METABOLIC PANEL
Anion gap: 7 (ref 5–15)
BUN: 21 mg/dL — AB (ref 6–20)
CO2: 19 mmol/L — AB (ref 22–32)
CREATININE: 1.2 mg/dL (ref 0.61–1.24)
Calcium: 8.1 mg/dL — ABNORMAL LOW (ref 8.9–10.3)
Chloride: 105 mmol/L (ref 101–111)
GFR calc non Af Amer: >60 mL/min (ref >60)
Glucose, Bld: 131 mg/dL — ABNORMAL HIGH (ref 65–99)
POTASSIUM: 4.9 mmol/L (ref 3.5–5.1)
Sodium: 131 mmol/L — ABNORMAL LOW (ref 135–145)

## 2015-06-20 LAB — CBC
HEMATOCRIT: 31.9 % — AB (ref 39.0–52.0)
Hemoglobin: 10.7 g/dL — ABNORMAL LOW (ref 13.0–17.0)
MCH: 30.1 pg (ref 26.0–34.0)
MCHC: 33.5 g/dL (ref 30.0–36.0)
MCV: 89.6 fL (ref 78.0–100.0)
Platelets: 341 10*3/uL (ref 150–400)
RBC: 3.56 MIL/uL — ABNORMAL LOW (ref 4.22–5.81)
RDW: 12.7 % (ref 11.5–15.5)
WBC: 12 10*3/uL — ABNORMAL HIGH (ref 4.0–10.5)

## 2015-06-20 LAB — PNEUMOCYSTIS JIROVECI SMEAR BY DFA: Pneumocystis jiroveci Ag: NEGATIVE

## 2015-06-20 LAB — VANCOMYCIN, TROUGH: VANCOMYCIN TR: 32 ug/mL — AB (ref 10.0–20.0)

## 2015-06-20 LAB — HEPARIN LEVEL (UNFRACTIONATED): Heparin Unfractionated: 0.4 IU/mL (ref 0.30–0.70)

## 2015-06-20 MED ORDER — SODIUM CHLORIDE 0.9 % IV BOLUS (SEPSIS)
500.0000 mL | Freq: Once | INTRAVENOUS | Status: AC
  Administered 2015-06-20: 500 mL via INTRAVENOUS

## 2015-06-20 MED ORDER — CHLORHEXIDINE GLUCONATE 0.12 % MT SOLN
OROMUCOSAL | Status: AC
  Filled 2015-06-20: qty 15

## 2015-06-20 NOTE — Clinical Social Work Note (Signed)
Clinical Social Work Assessment  Patient Details  Name: Chad George MRN: [id number] Date of Birth: 04/15/1971  Date of referral:  06/19/15               Reason for consult:  End of Life/Hospice                Permission sought to share information with:  Family Supports Permission granted to share information::  Yes, Verbal Permission Granted (Pt wife gave verbal permission to speak with pt sister in law Lucia, Chad Rota George in [place], consulate in [place], )  Name::     Lucy   Agency::  [place] [name] [place] [phone]  Relationship::  sister in law  Contact Information:  [phone]  Housing/Transportation Living arrangements for the past 2 months:  Single Family Home Source of Information:  Spouse, Other (Comment Required) (pt sister in law Lucia) Patient Interpreter Needed:  None (pt non verbal, offered interpreter for family, prefer to speak with pt sister in law) Criminal Activity/Legal Involvement Pertinent to Current Situation/Hospitalization:    Significant Relationships:  Spouse, Other Family Members Lives with:  Spouse, Minor Children Do you feel safe going back to the place where you live?  Yes Need for family participation in patient care:  Yes (Comment)  Care giving concerns:  CSW received consult to assist with patient's family from [place] to come to the [place] to see patient prior to death.    Social Worker assessment / plan:  CSW met with pt spouse who introduced csw to patient sister in law and niece. Pt family offered interpreter but preferred CSW to work directly with patient sister in law Lucia. Patient parents live in [place], as well as sister who pt has not seen in 20 years since [place]. Patient family would like patient parents and sister to come to the [place]. CSW reached out to CSW colleague for assistance. CSW attempted to contact [place] [employer], officer [name] Suarez ([phone]) in [place] however yesterday was a federal  holiday.   CSW met with NP, who assisted with letter to the consulate stating patient current medical situation. CSW also obtained patient full name, Chad George, copy of birth certificate, [place] ID, family members full names, addresses, date of births. CSW included this information in letter to consulate. CSW faxed to consulate at provided number [phone]. CSW also trying to send copy of letter to pt sister in [place], Chad George.   Employment status:  Full-Time Insurance information:  Self Pay (Medicaid Pending) PT Recommendations:  Not assessed at this time Information / Referral to community resources:  Other (Comment Required) (hospice breavement)  Patient/Family's Response to care:  Pt wife is very tearful and staying by the side of patient and in the waiting room with family. Pt sister in law and niece are very helpful working with CSW to assist it getting patient family here.  Patient/Family's Understanding of and Emotional Response to Diagnosis, Current Treatment, and Prognosis:  Patient family aware of patient poor prognosis. Patient family aware that patient will be passing away sometime this week and are hopeful for patient parents to see him prior to death.  CSW offered supportive counseling.   Emotional Assessment Appearance:  Other (Comment Required (non verbal on ventilator, non responsive) Attitude/Demeanor/Rapport:  Unable to Assess Affect (typically observed):  Unable to Assess Orientation:    Alcohol / Substance use:  Not Applicable Psych involvement (Current and /or in the community):  No (Comment)    Discharge Needs  Concerns to be addressed:  No discharge needs identified Readmission within the last 30 days:  No Current discharge risk:  None Barriers to Discharge:  No Barriers Identified   Latera Mclin, [place], LCSW 06/20/2015, 11:30 AM

## 2015-06-20 NOTE — Progress Notes (Signed)
ANTICOAGULATION CONSULT NOTE - Follow Up Consult  Pharmacy Consult for Heparin Indication: pulmonary embolus and DVT  Allergies  Allergen Reactions  . Amoxicillin-Pot Clavulanate Other (See Comments)    .Marland KitchenHas patient had a PCN reaction causing immediate rash, facial/tongue/throat swelling, SOB or lightheadedness with hypotension: No Has patient had a PCN reaction causing severe rash involving mucus membranes or skin necrosis: No Has patient had a PCN reaction that required hospitalization No Has patient had a PCN reaction occurring within the last 10 years: No If all of the above answers are "NO", then may proceed with Cephalosporin use.   Marland Kitchen Penicillins Rash    .Marland KitchenHas patient had a PCN reaction causing immediate rash, facial/tongue/throat swelling, SOB or lightheadedness with hypotension: No Has patient had a PCN reaction causing severe rash involving mucus membranes or skin necrosis: No Has patient had a PCN reaction that required hospitalization No Has patient had a PCN reaction occurring within the last 10 years: No If all of the above answers are "NO", then may proceed with Cephalosporin use.      Patient Measurements: Height: '5\' 6"'$  (167.6 cm) Weight: 145 lb 15.1 oz (66.2 kg) IBW/kg (Calculated) : 63.8  Hep Dosing Wt: Actual  Vital Signs: Temp: 97.1 F (36.2 C) (10/11 0800) Temp Source: Axillary (10/11 0800) BP: 99/50 mmHg (10/11 0700) Pulse Rate: 90 (10/11 0700)  Labs:  Recent Labs  06/29/2015 0900  06/10/2015 1332  06/19/15 0024 06/19/15 0350 06/20/15 0345  HGB 13.3  < > 12.9*  --   --  11.6* 10.7*  HCT 37.8*  < > 36.5*  --   --  34.2* 31.9*  PLT 323  --  299  --   --  301 341  APTT 33  --  50*  --   --   --   --   LABPROT 15.8*  --  16.7*  --   --   --   --   INR 1.25  --  1.34  --   --   --   --   HEPARINUNFRC 0.11*  --   --   < > 0.31 0.37 0.40  CREATININE 0.68  < > 0.53*  --   --  0.69 1.20  < > = values in this interval not displayed.  Estimated  Creatinine Clearance: 71.6 mL/min (by C-G formula based on Cr of 1.2).   Medications:  Infusions:  . sodium chloride 100 mL/hr at 06/19/15 2102  . fentaNYL infusion INTRAVENOUS 100 mcg/hr (06/19/15 1900)  . heparin 1,250 Units/hr (06/20/15 0231)  . propofol (DIPRIVAN) infusion 55 mcg/kg/min (06/20/15 0716)   Assessment: 62 yoM presents to ED with SOB, cough w/ sputum, pain, and hypoxia. PMH significant for lung cancer w/ mets to brain, recent dx DVT in LLE (06/08/15) started on Lovenox. Hypoxia concerning for PE, CT angio confirms acute PE with nonocclusive thrombus in R pulmonary arteries. Last PTA Lovenox dose given on 10/8 at 1200. Rx consulted to dose Heparin IV.  Today, 06/20/2015 CBC: Hgb 10.7 decreasing, Plt wnl SrCr 1.2 with CrCl ~72 Hep lvl = 0.4, therapeutic on 1250 units/hr No brusing or bleeding noted, RN notes minor amount of blood upon suctioning ET tube  Goal of Therapy:  Heparin level 0.3-0.7 units/ml Monitor platelets by anticoagulation protocol: Yes   Plan:   Continue Hep IV infusion at 1250 units/hr  Follow heparin lvl and CBC daily  Continue to monitor H&H, Plts, and bleeding   Chad George 06/20/2015,8:50 AM

## 2015-06-20 NOTE — Progress Notes (Signed)
ANTIBIOTIC CONSULT NOTE - FOLLOW UP  Pharmacy Consult for Vancomycin, Ceftazidime, Bactrim Indication: pneumonia, r/o PCP  Allergies  Allergen Reactions  . Amoxicillin-Pot Clavulanate Other (See Comments)    .Marland KitchenHas patient had a PCN reaction causing immediate rash, facial/tongue/throat swelling, SOB or lightheadedness with hypotension: No Has patient had a PCN reaction causing severe rash involving mucus membranes or skin necrosis: No Has patient had a PCN reaction that required hospitalization No Has patient had a PCN reaction occurring within the last 10 years: No If all of the above answers are "NO", then may proceed with Cephalosporin use.   Marland Kitchen Penicillins Rash    .Marland KitchenHas patient had a PCN reaction causing immediate rash, facial/tongue/throat swelling, SOB or lightheadedness with hypotension: No Has patient had a PCN reaction causing severe rash involving mucus membranes or skin necrosis: No Has patient had a PCN reaction that required hospitalization No Has patient had a PCN reaction occurring within the last 10 years: No If all of the above answers are "NO", then may proceed with Cephalosporin use.      Patient Measurements: Height: '5\' 6"'$  (167.6 cm) Weight: 145 lb 15.1 oz (66.2 kg) IBW/kg (Calculated) : 63.8  Vital Signs: Temp: 97.1 F (36.2 C) (10/11 0800) Temp Source: Axillary (10/11 0800) BP: 95/49 mmHg (10/11 1000) Pulse Rate: 89 (10/11 1000) Intake/Output from previous day: 10/10 0701 - 10/11 0700 In: 6118.2 [I.V.:3583.2; NG/GT:210; IV Piggyback:2325] Out: 260 [Urine:260]  Labs:  Recent Labs  06/28/2015 1332 06/19/15 0350 06/20/15 0345  WBC 10.6* 10.6* 12.0*  HGB 12.9* 11.6* 10.7*  PLT 299 301 341  CREATININE 0.53* 0.69 1.20   Estimated Creatinine Clearance: 71.6 mL/min (by C-G formula based on Cr of 1.2).  Recent Labs  06/20/15 1115  VANCOTROUGH 32*     Assessment: 57 yoM presents to ED with SOB, cough w/ sputum, pain, and hypoxia. PMH significant  for lung cancer with mets to brain currently taking oral chemo Gilotrif, and recent diagnosis of DVT in LLE (06/08/15) started on Lovenox. Hypoxia concerning for PE, CT scan pending. MD ordered vancomycin x1 dose in ED. Pharmacy is consulted to dose ceftazidime for possible pneumonia. PCN allergy (rash) is notes, but low risk for cephalosporin use.  10/9 >> Ceftazidime >> 10/9 >> Vanc >> 10/9 >> Bactrim IV >>  Today, 06/20/2015:  Afebrile  WBC increased, 12  SCr increasing, 1.2 with CrCl ~ 71 ml/min CG  No further allergic reactions noted since 10/9  Vancomycin trough level 32, supratherapeutic  Goal of Therapy:  Vancomycin trough level 15-20 mcg/ml Appropriate abx dosing, eradication of infection.   Plan:   Continue Ceftazidime 2g IV q8h  Continue Bactrim '400mg'$  TMP IV q8h  Monitor potassium level  Hold vancomycin doses for elevated level  Recheck vancomycin random level with AM labs.  When vancomycin resumes, infuse over at least 120 minutes, and premedicate with Benadryl '25mg'$  PO prior to each dose.  Recheck Vanc trough at steady state.  Follow up renal fxn, culture results, and clinical course.   Gretta Arab PharmD, BCPS Pager [phone] 06/20/2015 12:31 PM

## 2015-06-20 NOTE — Progress Notes (Signed)
PULMONARY / CRITICAL CARE MEDICINE   Name: Chad George MRN: [id number] DOB: 05/16/1971    ADMISSION DATE:  07/03/2015 CONSULTATION DATE:  07/07/2015  REFERRING MD :  EDP  CHIEF COMPLAINT:  PE and acute respiratory failure  INITIAL PRESENTATION: 44 year old male with PMH of stage 4 lung cancer and DVT on lovenox presenting with acute SOB.  CTA revealed a new PE and right sided pulmonary infiltrate.  Patient was placed on BiPAP and PCCM was called to admit.  Reports cough but no sputum production.  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  SIGNIFICANT EVENTS: 10/9 Admission and BiPAP for respiratory failure. Intubated.  10/10 FIO2 requirements high 10/11 FIO2 requirements unchanged. Renal fxn worse. Family trying to bring pt's family in from [place]  SUBJECTIVE: looks worse   VITAL SIGNS: Temp:  [97.1 F (36.2 C)-99.3 F (37.4 C)] 97.1 F (36.2 C) (10/11 0800) Pulse Rate:  [87-108] 94 (10/11 0851) Resp:  [10-34] 28 (10/11 0851) BP: (80-107)/(49-65) 96/50 mmHg (10/11 0851) SpO2:  [89 %-96 %] 93 % (10/11 0851) FiO2 (%):  [90 %-100 %] 90 % (10/11 0851) Weight:  [66.2 kg (145 lb 15.1 oz)] 66.2 kg (145 lb 15.1 oz) (10/11 0200) HEMODYNAMICS:   VENTILATOR SETTINGS: Vent Mode:  [-] PRVC FiO2 (%):  [90 %-100 %] 90 % Set Rate:  [24 bmp] 24 bmp Vt Set:  [380 mL] 380 mL PEEP:  [16 cmH20] 16 cmH20 Plateau Pressure:  [19 cmH20-38 cmH20] 20 cmH20 INTAKE / OUTPUT:  Intake/Output Summary (Last 24 hours) at 06/20/15 0914 Last data filed at 06/20/15 0700  Gross per 24 hour  Intake 5844.54 ml  Output    260 ml  Net 5584.54 ml    PHYSICAL EXAMINATION: General:  Acute on chronically ill appearing patient. Neuro:  Sedated on vent, moves  all ext to commands. HEENT:  [place]/AT, PERRL, EOM-I and MMM. Cardiovascular:  RRR, Nl S1/S2, -M/R/G. Lungs:  Diffuse crackles, more decreased BS on the left. Abdomen:  Soft, NT, ND and  +BS. Musculoskeletal:  -edema and -tenderness. Skin:  Intact.  LABS:  CBC  Recent Labs Lab 06/10/2015 1332 06/19/15 0350 06/20/15 0345  WBC 10.6* 10.6* 12.0*  HGB 12.9* 11.6* 10.7*  HCT 36.5* 34.2* 31.9*  PLT 299 301 341   Coag's  Recent Labs Lab 07/04/2015 0900 06/21/2015 1332  APTT 33 50*  INR 1.25 1.34   BMET  Recent Labs Lab 07/07/2015 1332 06/19/15 0350 06/20/15 0345  NA 137 136 131*  K 3.6 4.3 4.9  CL 110 110 105  CO2 19* 20* 19*  BUN 10 12 21*  CREATININE 0.53* 0.69 1.20  GLUCOSE 98 147* 131*   Electrolytes  Recent Labs Lab 07/10/2015 1332 06/19/15 0350 06/20/15 0345  CALCIUM 8.3* 8.2* 8.1*  MG 1.8 2.1  --   PHOS 4.4 4.3  --    Sepsis Markers  Recent Labs Lab 06/13/2015 1005 06/13/2015 1253  LATICACIDVEN 0.89 0.52   ABG  Recent Labs Lab 07/01/2015 1935 06/11/2015 2320 06/19/15 0430  PHART 7.230* 7.297* 7.332*  PCO2ART 49.8* 41.3 38.2  PO2ART 90.5 108* 109*   Liver Enzymes  Recent Labs Lab 06/17/2015 1332  AST 39  ALT 33  ALKPHOS 138*  BILITOT 0.8  ALBUMIN 2.5*   Cardiac Enzymes No results for input(s): TROPONINI, PROBNP in the last 168 hours. Glucose No results for input(s): GLUCAP in the last 168 hours.  Imaging No results found.worsening diffuse  airspace disease. Possible cavitation developing on left.    ASSESSMENT / PLAN:  PULMONARY OETT 10/10>>> A:  Acute respiratory failure, advancing cancer w/ diffuse lymphangitic spread and new PE. Possible HCAP vs opportunistic infection (less likely) >no change/FIO2 worse P:   - full vent support/ARDS protocol  - Full anticoag with heparin per pharmacy. - Need to discuss code status. - Treat as HCAP and PCP (actively getting chemo). - no further escalation  CARDIOVASCULAR CVL PIV A:  New PE. P:  - No indication for lytics at this time. - Hold home anti-HTN.  RENAL A:   AKI Metabolic acidosis P:   - BMET in AM. - Replace electrolytes as indicated. - NS 100  ml/hr.  GASTROINTESTINAL A:   No active issues. P:   - NPO  - PPI.  HEMATOLOGIC A:   Stage 4 lung cancer on chemo. DVT/PE P:  - Need to discuss EOL. - CBC in AM. - Transfuse per ICU protocol.  INFECTIOUS A:   WBC is normal but right lung has infiltrate and patient is on chemo. P:   BCx2 10/9>>> UC 10/9>>> Sputum 10/9>>> PCP 10/9>>>  Vanc 10/9>>> Zosyn 10/9>>> Bactrim 10/9>>>   ENDOCRINE A:  No active issues.   P:   - Monitor.  NEUROLOGIC A:  No active issues. P:   -PAD protocol   FAMILY  Spoke w/ family. They understand nothing else to add. Made him DNR. Will cont current supportive care, no escalation. Have asked social work to assist w/ getting family from [place] here. Likely w/d mid-week if no improvement.   Erick Colace ACNP-BC [place] Pager # [phone] OR # [phone] if no answer 06/20/2015, 9:14 AM   Attending Note:  I have examined patient, reviewed labs, studies and notes. I have discussed the case with Jerrye Bushy, and I agree with the data and plans as amended above. Stage IV lung CA with acute PE, bilateral infiltrates and acute respiratory failure.  His infiltrates are progressive, have not responded to diuresis. I suspect this is progression of his cancer. We have explained to his wife and family that this is not survivable. They understand this. Goal is for his other family to be able to get here from [place], have an opportunity to be with him.   Baltazar Apo, MD, PhD 06/20/2015, 10:57 AM [facility] [phone] or if no answer [phone]

## 2015-06-20 NOTE — Progress Notes (Signed)
CSW spoke with Darin Engels confirming documents needed. CSW was given email address and fax number for [place] in [place] as follows:   [email or web address]  [phone]   CSW also sent updated letter with corrections by family for address and date of births to the [place]. CSW also sent updated information to patient sister Tarry Fountain via email as requested.   Belia Heman, [place] Work  [facility] Covering ICU [phone]

## 2015-06-20 NOTE — Progress Notes (Addendum)
Per Terrial Rhodes, at [place] in [place], they are reaching out to [place] and will hopefully have a response with plan by tomorrow. CSW updated pt family.   Belia Heman, [place] Work  [employer] [phone]

## 2015-06-20 NOTE — Progress Notes (Signed)
eLink Physician-Brief Progress Note Patient Name: Chad George DOB: March 12, 1971 MRN: [id number]   Date of Service  06/20/2015  HPI/Events of Note  Pt in sinus tachycardia with HR in 120s. No pain  eICU Interventions  Will try a bolus of 500cc NS     Intervention Category Intermediate Interventions: Arrhythmia - evaluation and management  Cevin Rubinstein 06/20/2015, 10:30 PM

## 2015-06-21 ENCOUNTER — Inpatient Hospital Stay (HOSPITAL_COMMUNITY): Payer: Medicaid Other

## 2015-06-21 LAB — BASIC METABOLIC PANEL
ANION GAP: 9 (ref 5–15)
BUN: 27 mg/dL — ABNORMAL HIGH (ref 6–20)
CALCIUM: 8 mg/dL — AB (ref 8.9–10.3)
CO2: 14 mmol/L — ABNORMAL LOW (ref 22–32)
Chloride: 107 mmol/L (ref 101–111)
Creatinine, Ser: 1.7 mg/dL — ABNORMAL HIGH (ref 0.61–1.24)
GFR, EST AFRICAN AMERICAN: 55 mL/min — AB (ref >60)
GFR, EST NON AFRICAN AMERICAN: 48 mL/min — AB (ref >60)
Glucose, Bld: 136 mg/dL — ABNORMAL HIGH (ref 65–99)
POTASSIUM: 4.7 mmol/L (ref 3.5–5.1)
SODIUM: 130 mmol/L — AB (ref 135–145)

## 2015-06-21 LAB — BLOOD GAS, ARTERIAL
ACID-BASE DEFICIT: 12.6 mmol/L — AB (ref 0.0–2.0)
BICARBONATE: 17.6 meq/L — AB (ref 20.0–24.0)
DRAWN BY: 441261
FIO2: 1
LHR: 24 {breaths}/min
O2 Saturation: 98.7 %
PEEP/CPAP: 22 cmH2O
PH ART: 7.076 — AB (ref 7.350–7.450)
Patient temperature: 98.6
TCO2: 17.5 mmol/L (ref 0–100)
VT: 380 mL
pCO2 arterial: 62.7 mmHg (ref 35.0–45.0)
pO2, Arterial: 171 mmHg — ABNORMAL HIGH (ref 80.0–100.0)

## 2015-06-21 LAB — CBC
HEMATOCRIT: 30.6 % — AB (ref 39.0–52.0)
HEMOGLOBIN: 10.3 g/dL — AB (ref 13.0–17.0)
MCH: 30.2 pg (ref 26.0–34.0)
MCHC: 33.7 g/dL (ref 30.0–36.0)
MCV: 89.7 fL (ref 78.0–100.0)
Platelets: 330 10*3/uL (ref 150–400)
RBC: 3.41 MIL/uL — ABNORMAL LOW (ref 4.22–5.81)
RDW: 13.1 % (ref 11.5–15.5)
WBC: 12.1 10*3/uL — AB (ref 4.0–10.5)

## 2015-06-21 LAB — HEPARIN LEVEL (UNFRACTIONATED): Heparin Unfractionated: 0.42 IU/mL (ref 0.30–0.70)

## 2015-06-21 LAB — TRIGLYCERIDES: TRIGLYCERIDES: 290 mg/dL — AB (ref <150)

## 2015-06-21 LAB — GLUCOSE, CAPILLARY: Glucose-Capillary: 126 mg/dL — ABNORMAL HIGH (ref 65–99)

## 2015-06-21 LAB — VANCOMYCIN, RANDOM: VANCOMYCIN RM: 11 ug/mL

## 2015-06-21 MED ORDER — LIP MEDEX EX OINT
TOPICAL_OINTMENT | CUTANEOUS | Status: AC
  Administered 2015-06-22: 02:00:00
  Filled 2015-06-21: qty 7

## 2015-06-21 MED ORDER — DIPHENHYDRAMINE HCL 25 MG PO CAPS
25.0000 mg | ORAL_CAPSULE | Freq: Two times a day (BID) | ORAL | Status: DC
  Administered 2015-06-21: 25 mg via ORAL
  Filled 2015-06-21: qty 1

## 2015-06-21 MED ORDER — SODIUM CHLORIDE 0.9 % IV SOLN
1.0000 mg/h | INTRAVENOUS | Status: DC
  Administered 2015-06-21 – 2015-06-22 (×2): 1 mg/h via INTRAVENOUS
  Filled 2015-06-21 (×2): qty 10

## 2015-06-21 MED ORDER — VANCOMYCIN HCL IN DEXTROSE 750-5 MG/150ML-% IV SOLN
750.0000 mg | Freq: Two times a day (BID) | INTRAVENOUS | Status: DC
  Administered 2015-06-21: 750 mg via INTRAVENOUS
  Filled 2015-06-21: qty 150

## 2015-06-21 NOTE — Progress Notes (Signed)
ANTICOAGULATION CONSULT NOTE - Follow Up Consult  Pharmacy Consult for Heparin Indication: pulmonary embolus and DVT  Allergies  Allergen Reactions  . Amoxicillin-Pot Clavulanate Other (See Comments)    .Marland KitchenHas patient had a PCN reaction causing immediate rash, facial/tongue/throat swelling, SOB or lightheadedness with hypotension: No Has patient had a PCN reaction causing severe rash involving mucus membranes or skin necrosis: No Has patient had a PCN reaction that required hospitalization No Has patient had a PCN reaction occurring within the last 10 years: No If all of the above answers are "NO", then may proceed with Cephalosporin use.   Marland Kitchen Penicillins Rash    .Marland KitchenHas patient had a PCN reaction causing immediate rash, facial/tongue/throat swelling, SOB or lightheadedness with hypotension: No Has patient had a PCN reaction causing severe rash involving mucus membranes or skin necrosis: No Has patient had a PCN reaction that required hospitalization No Has patient had a PCN reaction occurring within the last 10 years: No If all of the above answers are "NO", then may proceed with Cephalosporin use.      Patient Measurements: Height: '5\' 6"'$  (167.6 cm) Weight: 156 lb 8.4 oz (71 kg) IBW/kg (Calculated) : 63.8 Heparin Dosing Weight: Actual  Vital Signs: Temp: 98.6 F (37 C) (10/12 0400) Temp Source: Oral (10/12 0400) BP: 114/53 mmHg (10/12 0400) Pulse Rate: 112 (10/12 0400)  Labs:  Recent Labs  06/28/2015 0900  07/05/2015 1332  06/19/15 0350 06/20/15 0345 06/21/15 0600  HGB 13.3  < > 12.9*  --  11.6* 10.7* 10.3*  HCT 37.8*  < > 36.5*  --  34.2* 31.9* 30.6*  PLT 323  --  299  --  301 341 330  APTT 33  --  50*  --   --   --   --   LABPROT 15.8*  --  16.7*  --   --   --   --   INR 1.25  --  1.34  --   --   --   --   HEPARINUNFRC 0.11*  --   --   < > 0.37 0.40 0.42  CREATININE 0.68  < > 0.53*  --  0.69 1.20  --   < > = values in this interval not displayed.  Estimated  Creatinine Clearance: 71.6 mL/min (by C-G formula based on Cr of 1.2).   Medications:  Infusions:  . sodium chloride 1,000 mL (06/20/15 1713)  . fentaNYL infusion INTRAVENOUS 175 mcg/hr (06/21/15 0318)  . heparin 1,250 Units/hr (06/20/15 2311)  . propofol (DIPRIVAN) infusion 65 mcg/kg/min (06/21/15 0734)    Assessment: 79 yoM presents to ED with SOB, cough w/ sputum, pain, and hypoxia. PMH significant for lung cancer w/ mets to brain, recent dx DVT in LLE (06/08/15) started on Lovenox. Hypoxia concerning for PE, CT angio confirms acute PE with nonocclusive thrombus in R pulmonary arteries. Last PTA Lovenox dose given on 10/8 at 1200. Rx consulted to dose Heparin IV.  Today, 06/21/2015  CBC: Hgb 10.3 decreasing, Plt wnl SrCr increased to 1.7 with CrCl ~56 mL/min Hep lvl = 0.42, therapeutic on 1250 units/hr No brusing or bleeding noted, RN notes minor amount of blood upon suctioning ET tube  Goal of Therapy:  Heparin level 0.3-0.7 units/ml Monitor platelets by anticoagulation protocol: Yes   Plan:   Continue Hep IV infusion at 1250 units/hr  Follow heparin lvl and CBC daily  Continue to monitor H&H, Plts, and bleeding   Monti Villers 06/21/2015,8:04 AM

## 2015-06-21 NOTE — Progress Notes (Signed)
WIS 2.62m of fentanyl 10 mcg/ml concentration.  Witnessed by CKnox SalivaRN.  TIrven [place] RN

## 2015-06-21 NOTE — Progress Notes (Signed)
RT came to assess ventilator alarms and perform routine ventilator check. Patient was breathing a total of 50-65 times per minute with minimal exhaled MV and Vt. RN also at bedside. Elink was called and patient was given versed in addition to previous sedation medications. Patient continued to be dyssynchronous with ventilator and O2 saturations began to fall to 82-85%; RT titrated PEEP and FiO2 per ARDS net protocol. Patient was uncomfortable and an ABG was performed. ABG results on PRVC 380/24/+22/100% were 7.08/63/171/18. Dr. Vaughan Browner and RN notified of results.

## 2015-06-21 NOTE — Progress Notes (Signed)
ANTIBIOTIC CONSULT NOTE - FOLLOW UP  Pharmacy Consult for Vancomycin, Ceftazidime, Bactrim Indication: pneumonia, r/o PCP  Allergies  Allergen Reactions  . Amoxicillin-Pot Clavulanate Other (See Comments)    .Marland KitchenHas patient had a PCN reaction causing immediate rash, facial/tongue/throat swelling, SOB or lightheadedness with hypotension: No Has patient had a PCN reaction causing severe rash involving mucus membranes or skin necrosis: No Has patient had a PCN reaction that required hospitalization No Has patient had a PCN reaction occurring within the last 10 years: No If all of the above answers are "NO", then may proceed with Cephalosporin use.   Marland Kitchen Penicillins Rash    .Marland KitchenHas patient had a PCN reaction causing immediate rash, facial/tongue/throat swelling, SOB or lightheadedness with hypotension: No Has patient had a PCN reaction causing severe rash involving mucus membranes or skin necrosis: No Has patient had a PCN reaction that required hospitalization No Has patient had a PCN reaction occurring within the last 10 years: No If all of the above answers are "NO", then may proceed with Cephalosporin use.      Patient Measurements: Height: '5\' 6"'$  (167.6 cm) Weight: 145 lb 15.1 oz (66.2 kg) IBW/kg (Calculated) : 63.8  Vital Signs: Temp: 98.6 F (37 C) (10/12 0400) Temp Source: Oral (10/12 0400) BP: 106/58 mmHg (10/12 0000) Pulse Rate: 113 (10/12 0319) Intake/Output from previous day: 10/11 0701 - 10/12 0700 In: 2767.9 [I.V.:1617.9; IV Piggyback:1150] Out: 350 [Urine:350]  Labs:  Recent Labs  06/21/2015 1332 06/19/15 0350 06/20/15 0345 06/21/15 0600  WBC 10.6* 10.6* 12.0* 12.1*  HGB 12.9* 11.6* 10.7* 10.3*  PLT 299 301 341 330  CREATININE 0.53* 0.69 1.20  --    Estimated Creatinine Clearance: 71.6 mL/min (by C-G formula based on Cr of 1.2).  Recent Labs  06/20/15 1115 06/21/15 0600  VANCOTROUGH 32*  --   VANCORANDOM  --  11     Assessment: 70 yoM presents to ED  with SOB, cough w/ sputum, pain, and hypoxia. PMH significant for lung cancer with mets to brain currently taking oral chemo Gilotrif, and recent diagnosis of DVT in LLE (06/08/15) started on Lovenox. Hypoxia concerning for PE, CT scan pending. MD ordered vancomycin x1 dose in ED. Pharmacy is consulted to dose ceftazidime for possible pneumonia. PCN allergy (rash) is notes, but low risk for cephalosporin use.  10/9 >> Ceftazidime >> 10/9 >> Vanc >> 10/9 >> Bactrim IV >>  Today, 06/21/2015:  Afebrile  WBC elevated (steroids), 12.1  SCr increasing, 1.7 with CrCl ~ 50 ml/min CG  No further allergic reactions noted since 10/9  Note significant fluid weight gain up to 71kg today (admission weight 59.4kg)  Levels: 10/11 Vanc trough = 32, supratherapeutic on vanc 1g q8h 10/12 Vanc random = 11, subtherapeutic (24 hours after last dose)  Goal of Therapy:  Vancomycin trough level 15-20 mcg/ml Appropriate abx dosing, eradication of infection.   Plan:   Continue Ceftazidime 2g IV q8h  Continue Bactrim '400mg'$  TMP IV q8h  Monitor potassium level  Resume vancomycin '750mg'$  IV x1 dose then 500 mg IV q12h  Recheck vancomycin trough level at new steady state  To prevent infusion reactions, infuse vancomycin over at least 120 minutes, and premedicate with Benadryl '25mg'$  PO prior to each dose.  Follow up renal fxn, culture results, and clinical course.   Gretta Arab PharmD, BCPS Pager [phone] 06/21/2015 7:06 AM

## 2015-06-21 NOTE — Progress Notes (Signed)
Called and notified [place] physician on call of patient's HR sustaining at 120. Patient is afebrile and has no signs of pain. New order written.

## 2015-06-21 NOTE — Progress Notes (Signed)
Date:  Oct. 12, 2016 U.R. performed for needs and level of care. Remain on vent support. Will continue to follow for Case Management needs.  Velva Harman, RN, BSN, [place]   [phone]

## 2015-06-21 NOTE — Progress Notes (Signed)
PCCM ACUTE EVALUATION  Subjective: Had mismatch with inhaled/exhaled Vt.  ETT adjusted by RT.  Objective: BP 114/61 mmHg  Pulse 125  Temp(Src) 98.1 F (36.7 C) (Oral)  Resp 28  Ht '5\' 6"'$  (1.676 m)  Wt 156 lb 8.4 oz (71 kg)  BMI 25.28 kg/m2  SpO2 94%  General: sedated HEENT: ETT in place, no audible air movement around ETT with cuff inflated Cardiac: regular, tachycardic Chest: b/l wheeze and crackles Abd: soft, non tender Ext: no edema Neuro: RASS -3  CMP Latest Ref Rng 06/21/2015 06/20/2015 06/19/2015  Glucose 65 - 99 mg/dL 136(H) 131(H) 147(H)  BUN 6 - 20 mg/dL 27(H) 21(H) 12  Creatinine 0.61 - 1.24 mg/dL 1.70(H) 1.20 0.69  Sodium 135 - 145 mmol/L 130(L) 131(L) 136  Potassium 3.5 - 5.1 mmol/L 4.7 4.9 4.3  Chloride 101 - 111 mmol/L 107 105 110  CO2 22 - 32 mmol/L 14(L) 19(L) 20(L)  Calcium 8.9 - 10.3 mg/dL 8.0(L) 8.1(L) 8.2(L)  Total Protein 6.5 - 8.1 g/dL - - -  Total Bilirubin 0.3 - 1.2 mg/dL - - -  Alkaline Phos 38 - 126 U/L - - -  AST 15 - 41 U/L - - -  ALT 17 - 63 U/L - - -    CBC Latest Ref Rng 06/21/2015 06/20/2015 06/19/2015  WBC 4.0 - 10.5 K/uL 12.1(H) 12.0(H) 10.6(H)  Hemoglobin 13.0 - 17.0 g/dL 10.3(L) 10.7(L) 11.6(L)  Hematocrit 39.0 - 52.0 % 30.6(L) 31.9(L) 34.2(L)  Platelets 150 - 400 K/uL 330 341 301    ABG    Component Value Date/Time   PHART 7.076* 06/21/2015 1732   PCO2ART 62.7* 06/21/2015 1732   PO2ART 171* 06/21/2015 1732   HCO3 17.6* 06/21/2015 1732   TCO2 17.5 06/21/2015 1732   ACIDBASEDEF 12.6* 06/21/2015 1732   O2SAT 98.7 06/21/2015 1732    Dg Chest 1 View  06/21/2015  CLINICAL DATA:  Adjustment of endotracheal tube EXAM: CHEST 1 VIEW COMPARISON:  Study obtained earlier in the day FINDINGS: Endotracheal tube tip is 7.4 cm above the carina. Nasogastric tube tip is in the stomach with the side port near the gastroesophageal junction. No pneumothorax. Widespread interstitial and alveolar opacity remains without change. Heart is upper  normal in size with pulmonary vascularity within normal limits. No adenopathy. IMPRESSION: Tube positions as described without pneumothorax. It may be prudent to advance the nasogastric tube 6-8 cm to insure that the nasogastric tube and side port are well within the stomach. Diffuse interstitial and alveolar opacity bilaterally remain, consistent with ARDS. Underlying pneumonia or neoplasm must be of concern as well. More than one of these entities may exist concurrently. Electronically Signed   By: Lowella Grip III M.D.   On: 06/21/2015 19:05   Dg Chest Port 1 View  06/21/2015  CLINICAL DATA:  Acute respiratory failure EXAM: PORTABLE CHEST 1 VIEW COMPARISON:  07/07/2015 FINDINGS: Endotracheal tube has been retracted, now 10 cm above the carina. NG tube enters the stomach. Severe bilateral airspace opacities are again noted, similar to prior study. Heart is upper limits normal in size. No visible effusions. IMPRESSION: Continued severe bilateral airspace disease. Endotracheal tube retracted, 10 cm above the carina. Electronically Signed   By: Rolm Baptise M.D.   On: 06/21/2015 18:11    Assessment/Plan:  Acute respiratory failure with concern for malpositioned ETT >> he is matching inhaled/exhaled Vt, oxygenation improved, and b/l breath sounds after ETT manipulated by RT >> this was done after most recent CXR from 1858. Plan: -  monitor vent mechanics and oxygenation - DNR, no escalation - possible transition to comfort care, pending further discussions with pt's family  Chesley Mires, MD [place] 06/21/2015, 9:20 PM Pager:  [phone] After 3pm call: [phone]

## 2015-06-21 NOTE — Progress Notes (Signed)
During intubation Dr. Titus Mould ordered '2mg'$  Versed IV and 168mg of Fentanyl IV.  Versed was admin at 1732 and Fentanyl was admin at 1731.  TIrven Baltimore RN

## 2015-06-21 NOTE — Progress Notes (Signed)
Chaplain paged to assist family and friends in spiritual matters surrounding Chad George present condition. 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 At one time there were about 34 people from the church in the waiting room praying for him and supporting family and friends. There is an active effort by the CSW to facilitate Chad George mother, father and sister to come from [place] on an emergency visa to see him. Congregation is praying for officials in [place] and the [place] to be open to this and that Chad George will survive long enough for the process to happen. Chad George is well attended by family and friends, who are looking after what needs they can minister to. Those attending to him speak to him and hold his hand, to assure him that he is safe and not alone.  Chaplain lead in many prayers both to groups and with individuals. The pastor of their church is present doing the same.  Chaplain should be paged immediately if needed to assist with those present, or for Chad George's needs.  Sallee Lange. Lilith Solana, [place]

## 2015-06-21 NOTE — Progress Notes (Signed)
PULMONARY / CRITICAL CARE MEDICINE   Name: Chad George MRN: [id number] DOB: 1970/11/12    ADMISSION DATE:  07/01/2015 CONSULTATION DATE:  06/26/2015  REFERRING MD :  EDP  CHIEF COMPLAINT:  PE and acute respiratory failure  INITIAL PRESENTATION: 44 year old male with PMH of stage 4 lung cancer and DVT on lovenox presenting with acute SOB.  CTA revealed a new PE and right sided pulmonary infiltrate.  Patient was placed on BiPAP and PCCM was called to admit.  Reports cough but no sputum production.  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  SIGNIFICANT EVENTS: 10/9 Admission and BiPAP for respiratory failure. Intubated.  10/10 FIO2 requirements high 10/11 FIO2 requirements unchanged. Renal fxn worse. Family trying to bring pt's family in from [place]  SUBJECTIVE: looks worse   VITAL SIGNS: Temp:  [97.8 F (36.6 C)-98.7 F (37.1 C)] 98.6 F (37 C) (10/12 0400) Pulse Rate:  [88-120] 106 (10/12 0740) Resp:  [10-32] 30 (10/12 0740) BP: (85-118)/(46-65) 110/51 mmHg (10/12 0740) SpO2:  [91 %-95 %] 95 % (10/12 0740) FiO2 (%):  [80 %-90 %] 80 % (10/12 0740) Weight:  [71 kg (156 lb 8.4 oz)] 71 kg (156 lb 8.4 oz) (10/12 0700) HEMODYNAMICS:   VENTILATOR SETTINGS: Vent Mode:  [-] PRVC FiO2 (%):  [80 %-90 %] 80 % Set Rate:  [24 bmp] 24 bmp Vt Set:  [380 mL] 380 mL PEEP:  [14 cmH20-16 cmH20] 14 cmH20 Plateau Pressure:  [20 cmH20-38 cmH20] 32 cmH20 INTAKE / OUTPUT:  Intake/Output Summary (Last 24 hours) at 06/21/15 0838 Last data filed at 06/21/15 0700  Gross per 24 hour  Intake 3637.74 ml  Output   1050 ml  Net 2587.74 ml    PHYSICAL EXAMINATION: General:  Acute on chronically ill appearing patient. Neuro:  Sedated on vent, moves  all ext to commands. HEENT:  [place]/AT, PERRL, EOM-I and MMM. Cardiovascular:  RRR, Nl S1/S2, -M/R/G. Lungs:  Diffuse crackles, more decreased BS on the left. Abdomen:  Soft, NT, ND and  +BS. Musculoskeletal:  -edema and -tenderness. Skin:  Intact.  LABS:  CBC  Recent Labs Lab 06/19/15 0350 06/20/15 0345 06/21/15 0600  WBC 10.6* 12.0* 12.1*  HGB 11.6* 10.7* 10.3*  HCT 34.2* 31.9* 30.6*  PLT 301 341 330   Coag's  Recent Labs Lab 07/06/2015 0900 07/02/2015 1332  APTT 33 50*  INR 1.25 1.34   BMET  Recent Labs Lab 06/19/15 0350 06/20/15 0345 06/21/15 0600  NA 136 131* 130*  K 4.3 4.9 4.7  CL 110 105 107  CO2 20* 19* 14*  BUN 12 21* 27*  CREATININE 0.69 1.20 1.70*  GLUCOSE 147* 131* 136*   Electrolytes  Recent Labs Lab 06/19/2015 1332 06/19/15 0350 06/20/15 0345 06/21/15 0600  CALCIUM 8.3* 8.2* 8.1* 8.0*  MG 1.8 2.1  --   --   PHOS 4.4 4.3  --   --    Sepsis Markers  Recent Labs Lab 06/12/2015 1005 06/30/2015 1253  LATICACIDVEN 0.89 0.52   ABG  Recent Labs Lab 06/24/2015 1935 06/26/2015 2320 06/19/15 0430  PHART 7.230* 7.297* 7.332*  PCO2ART 49.8* 41.3 38.2  PO2ART 90.5 108* 109*   Liver Enzymes  Recent Labs Lab 06/14/2015 1332  AST 39  ALT 33  ALKPHOS 138*  BILITOT 0.8  ALBUMIN 2.5*   Cardiac Enzymes No results for input(s): TROPONINI, PROBNP in the last 168 hours. Glucose No results for input(s): GLUCAP in the  last 168 hours.  Imaging No results found.  ASSESSMENT / PLAN:  Acute respiratory failure, advancing cancer w/ diffuse lymphangitic spread and new PE. Possible HCAP vs opportunistic infection (less likely) >no change/FIO2 >plan for w/d eventually  >PCP neg. >culture data negative  Plan:   - full vent support/ARDS protocol  - Full anticoag with heparin per pharmacy. - Need to discuss code status. - Treat as HCAP -->dc vanc - no further escalation - d/c vanc and bactrim   AKI Metabolic acidosis Plan:   - BMET in AM. - Replace electrolytes as indicated. - NS 100 ml/hr. - no escalation     FAMILY  Spoke w/ family. They understand nothing else to add. Made him DNR. Will cont current supportive  care, no escalation. Have asked social work to assist w/ getting family from [place] here. No escalation. rx comfort.   Erick Colace ACNP-BC [place] Pager # [phone] OR # [phone] if no answer 06/21/2015, 8:38 AM    Attending Note:  I have examined patient, reviewed labs, studies and notes. I have discussed the case with Jerrye Bushy, and I agree with the data and plans as amended above. He has resp failure due to lung cancer. Unfortunately there is nothing here we can impact to reverse this. We have explained to family that we need to withdraw care. They understand and are working to gather family including bringing in some family from outside the country.   Baltazar Apo, MD, PhD 06/21/2015, 12:38 PM [facility] [phone] or if no answer [phone]

## 2015-06-21 NOTE — Progress Notes (Signed)
Pt having increased respirations 50-60 per minute.  o2 sat 88-89%.  Called [facility] and informed Dr. Vaughan Browner.  Versed gtt added.  Irven [place], RN

## 2015-06-21 NOTE — Progress Notes (Signed)
[facility] Progress Note Patient Name: Chad George DOB: 08-03-1971 MRN: [id number]   Date of Service  06/21/2015  HPI/Events of Note  RR in 42s, Extra sounds heard around the ET tube. CXR shows ET tube above the level of clavicle.  eICU Interventions  Advance ET tube by 4 cm. Repeat CXR.     Intervention Category Major Interventions: Respiratory failure - evaluation and management  Lorelie Biermann 06/21/2015, 6:28 PM

## 2015-06-21 NOTE — Progress Notes (Signed)
[name] Progress Note Patient Name: Ivey Cina DOB: 1970-11-01 MRN: [id number]   Date of Service  06/21/2015  HPI/Events of Note  Increasing agitation, dyssynnchrony with vent  eICU Interventions  Add versed drip     Intervention Category Intermediate Interventions: Other:  Erika Hussar 06/21/2015, 3:44 PM

## 2015-06-21 NOTE — Progress Notes (Deleted)
Called and notified [place] physician on call of patient's HR sustaining at 120. Patient is afebrile and has no signs of pain. New order written.

## 2015-06-21 NOTE — Progress Notes (Signed)
CSW received call from immigration regarding huminitarian act to have patient parents and sister to come to the [place]. Per discussion, immigration to speak with pt wife, and submit paperwork to supervisor today with hopefully a response today regarding decision for family to travel to the [place].   Belia Heman, [place] Work  [employer] [phone]

## 2015-06-22 LAB — HEPARIN LEVEL (UNFRACTIONATED)
HEPARIN UNFRACTIONATED: 0.74 [IU]/mL — AB (ref 0.30–0.70)
HEPARIN UNFRACTIONATED: 0.67 [IU]/mL (ref 0.30–0.70)
Heparin Unfractionated: 0.86 IU/mL — ABNORMAL HIGH (ref 0.30–0.70)

## 2015-06-22 LAB — CBC
HCT: 34.3 % — ABNORMAL LOW (ref 39.0–52.0)
Hemoglobin: 11.1 g/dL — ABNORMAL LOW (ref 13.0–17.0)
MCH: 30 pg (ref 26.0–34.0)
MCHC: 32.4 g/dL (ref 30.0–36.0)
MCV: 92.7 fL (ref 78.0–100.0)
Platelets: 407 10*3/uL — ABNORMAL HIGH (ref 150–400)
RBC: 3.7 MIL/uL — ABNORMAL LOW (ref 4.22–5.81)
RDW: 13.6 % (ref 11.5–15.5)
WBC: 17.3 10*3/uL — ABNORMAL HIGH (ref 4.0–10.5)

## 2015-06-22 LAB — CULTURE, RESPIRATORY: CULTURE: NO GROWTH

## 2015-06-22 LAB — CULTURE, RESPIRATORY W GRAM STAIN

## 2015-06-22 MED ORDER — HEPARIN (PORCINE) IN NACL 100-0.45 UNIT/ML-% IJ SOLN
1000.0000 [IU]/h | INTRAMUSCULAR | Status: DC
  Administered 2015-06-22: 1100 [IU]/h via INTRAVENOUS
  Administered 2015-06-22: 1000 [IU]/h via INTRAVENOUS
  Filled 2015-06-22 (×2): qty 250

## 2015-06-22 MED ORDER — CHLORHEXIDINE GLUCONATE 0.12 % MT SOLN
OROMUCOSAL | Status: AC
  Filled 2015-06-22: qty 15

## 2015-06-22 MED ORDER — DEXTROSE 5 % IV SOLN
2.0000 g | INTRAVENOUS | Status: DC
  Filled 2015-06-22: qty 2

## 2015-06-22 NOTE — Progress Notes (Signed)
PULMONARY / CRITICAL CARE MEDICINE   Name: Chad George MRN: [id number] DOB: 1971-08-25    ADMISSION DATE:  07/08/2015 CONSULTATION DATE:  06/27/2015  REFERRING MD :  EDP  CHIEF COMPLAINT:  PE and acute respiratory failure  INITIAL PRESENTATION: 44 year old male with PMH of stage 4 lung cancer and DVT on lovenox presenting with acute SOB.  CTA revealed a new PE and right sided pulmonary infiltrate.  Patient was placed on BiPAP and PCCM was called to admit.  Reports cough but no sputum production.  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  SIGNIFICANT EVENTS: 10/9 Admission and BiPAP for respiratory failure. Intubated.  10/10 FIO2 requirements high 10/11 FIO2 requirements unchanged. Renal fxn worse. Family trying to bring pt's family in from [place] 10/12 made DNR  SUBJECTIVE: looks worse   VITAL SIGNS: Temp:  [98.1 F (36.7 C)-98.9 F (37.2 C)] 98.1 F (36.7 C) (10/13 0357) Pulse Rate:  [56-125] 102 (10/13 0600) Resp:  [10-39] 21 (10/13 0600) BP: (101-122)/(47-65) 101/51 mmHg (10/13 0600) SpO2:  [82 %-96 %] 90 % (10/13 0600) FiO2 (%):  [70 %-100 %] 100 % (10/13 0747) Weight:  [164 lb 0.4 oz (74.4 kg)] 164 lb 0.4 oz (74.4 kg) (10/13 0313) HEMODYNAMICS:   VENTILATOR SETTINGS: Vent Mode:  [-] PRVC FiO2 (%):  [70 %-100 %] 100 % Set Rate:  [24 bmp] 24 bmp Vt Set:  [380 mL] 380 mL PEEP:  [14 cmH20-22 cmH20] 22 cmH20 Plateau Pressure:  [19 cmH20-38 cmH20] 36 cmH20 INTAKE / OUTPUT:  Intake/Output Summary (Last 24 hours) at 06/22/15 0915 Last data filed at 06/22/15 8938  Gross per 24 hour  Intake 3345.5 ml  Output    490 ml  Net 2855.5 ml    PHYSICAL EXAMINATION: General:  Acute on chronically ill appearing patient. Heavily sedated Neuro:  Sedated on vent, no spontaneous movement HEENT:  [place]/AT, PERRL, EOM-I and MMM. Cardiovascular:  RRR, Nl S1/S2, -M/R/G. Lungs:  Diffuse crackles, more decreased BS on the  left. Abdomen:  Soft, NT, ND and +BS. Musculoskeletal:  -edema and -tenderness. Skin:  Intact.  LABS:  CBC  Recent Labs Lab 06/20/15 0345 06/21/15 0600 06/22/15 0425  WBC 12.0* 12.1* 17.3*  HGB 10.7* 10.3* 11.1*  HCT 31.9* 30.6* 34.3*  PLT 341 330 407*   Coag's  Recent Labs Lab 07/06/2015 0900 07/08/2015 1332  APTT 33 50*  INR 1.25 1.34   BMET  Recent Labs Lab 06/19/15 0350 06/20/15 0345 06/21/15 0600  NA 136 131* 130*  K 4.3 4.9 4.7  CL 110 105 107  CO2 20* 19* 14*  BUN 12 21* 27*  CREATININE 0.69 1.20 1.70*  GLUCOSE 147* 131* 136*   Electrolytes  Recent Labs Lab 07/08/2015 1332 06/19/15 0350 06/20/15 0345 06/21/15 0600  CALCIUM 8.3* 8.2* 8.1* 8.0*  MG 1.8 2.1  --   --   PHOS 4.4 4.3  --   --    Sepsis Markers  Recent Labs Lab 06/10/2015 1005 06/29/2015 1253  LATICACIDVEN 0.89 0.52   ABG  Recent Labs Lab 06/15/2015 2320 06/19/15 0430 06/21/15 1732  PHART 7.297* 7.332* 7.076*  PCO2ART 41.3 38.2 62.7*  PO2ART 108* 109* 171*   Liver Enzymes  Recent Labs Lab 06/20/2015 1332  AST 39  ALT 33  ALKPHOS 138*  BILITOT 0.8  ALBUMIN 2.5*   Cardiac Enzymes No results for input(s): TROPONINI, PROBNP in the last 168 hours. Glucose  Recent Labs Lab  06/21/15 1542  GLUCAP 126*    Imaging Dg Chest 1 View  06/21/2015  CLINICAL DATA:  Adjustment of endotracheal tube EXAM: CHEST 1 VIEW COMPARISON:  Study obtained earlier in the day FINDINGS: Endotracheal tube tip is 7.4 cm above the carina. Nasogastric tube tip is in the stomach with the side port near the gastroesophageal junction. No pneumothorax. Widespread interstitial and alveolar opacity remains without change. Heart is upper normal in size with pulmonary vascularity within normal limits. No adenopathy. IMPRESSION: Tube positions as described without pneumothorax. It may be prudent to advance the nasogastric tube 6-8 cm to insure that the nasogastric tube and side port are well within the stomach.  Diffuse interstitial and alveolar opacity bilaterally remain, consistent with ARDS. Underlying pneumonia or neoplasm must be of concern as well. More than one of these entities may exist concurrently. Electronically Signed   By: Lowella Grip III M.D.   On: 06/21/2015 19:05   Dg Chest Port 1 View  06/21/2015  CLINICAL DATA:  Acute respiratory failure EXAM: PORTABLE CHEST 1 VIEW COMPARISON:  07/04/2015 FINDINGS: Endotracheal tube has been retracted, now 10 cm above the carina. NG tube enters the stomach. Severe bilateral airspace opacities are again noted, similar to prior study. Heart is upper limits normal in size. No visible effusions. IMPRESSION: Continued severe bilateral airspace disease. Endotracheal tube retracted, 10 cm above the carina. Electronically Signed   By: Rolm Baptise M.D.   On: 06/21/2015 18:11    ASSESSMENT / PLAN:  Acute respiratory failure, advancing cancer w/ diffuse lymphangitic spread and new PE. Possible HCAP vs opportunistic infection (less likely) >no change/FIO2 >plan for w/d eventually  >PCP neg. >culture data negative  Plan:   - full vent support/ARDS protocol  - Full anticoag with heparin per pharmacy. - Need to discuss code status. Made DNR 10/12  - Treat as HCAP -->dc vanc - no further escalation - d/c vanc and bactrim   AKI Metabolic acidosis Plan:   - BMET in AM. - Replace electrolytes as indicated. - NS 100 ml/hr. - no escalation     FAMILY  Spoke w/ family. They understand nothing else to add. Made him DNR. Will cont current supportive care, no escalation. Have asked social work to assist w/ getting family from [place] here. No escalation. rx comfort.   10/13 On maximum ventilator support, three sedatives IV to tolerate vent. No labs ordered.  Richardson Landry Minor ACNP [facility] PCCM Pager [phone] till 3 pm If no answer page [phone] 06/22/2015, 9:17 AM   Attending Note:  I have examined patient, reviewed labs, studies and notes. I have  discussed the case with S Minor, and I agree with the data and plans as amended above. Pt with ARDS and progressive lung cancer, acute respiratory failure. I have discussed status with family at bedside. I have recommended withdrawal of care but they remain resistant to this. They have been hopeful that other family can get here to see him from [place] via the [place]. Even so it is not clear to me that they are accepting withdrawal of care. I will plan to meet with them again on 10/14 to discuss further.  Independent critical care time is 40 minutes.   Baltazar Apo, MD, PhD 06/22/2015, 5:24 PM [facility] [phone] or if no answer [phone]

## 2015-06-22 NOTE — Progress Notes (Signed)
CSW consulted with chaplain, Brittney who specializes in pediatrics. Chaplain agreed to assist with conversation with children if family requests. CSW and chaplain spoke with pt family members to offer if needed.

## 2015-06-22 NOTE — Progress Notes (Signed)
RT called CCM about patient. Patients RR was 56 on the vent and the RR vent setting is 24. Also tidal volume are set on 380 and patient is only pulling volumes between 37 and 110. CCM stated to leave everything as is. RT notified RN of what CCM said.RT will continue to monitor.

## 2015-06-22 NOTE — Progress Notes (Signed)
RN called RT about alarms alarming on patients vent. The family could not tolerate the alarms and were concerned.RT changed out vent while RN bagged patient. RT placed patient back on vent and alarms still remained coming on consistantley. RT changed parameters several times with no success.RN received verbal order for RT to lower alarms on vent from CCM. RT will continue to monitor.

## 2015-06-22 NOTE — Progress Notes (Signed)
ANTICOAGULATION CONSULT NOTE - Follow Up Consult  Pharmacy Consult for Heparin Indication: pulmonary embolus and DVT  Allergies  Allergen Reactions  . Amoxicillin-Pot Clavulanate Other (See Comments)    .Marland KitchenHas patient had a PCN reaction causing immediate rash, facial/tongue/throat swelling, SOB or lightheadedness with hypotension: No Has patient had a PCN reaction causing severe rash involving mucus membranes or skin necrosis: No Has patient had a PCN reaction that required hospitalization No Has patient had a PCN reaction occurring within the last 10 years: No If all of the above answers are "NO", then may proceed with Cephalosporin use.   Marland Kitchen Penicillins Rash    .Marland KitchenHas patient had a PCN reaction causing immediate rash, facial/tongue/throat swelling, SOB or lightheadedness with hypotension: No Has patient had a PCN reaction causing severe rash involving mucus membranes or skin necrosis: No Has patient had a PCN reaction that required hospitalization No Has patient had a PCN reaction occurring within the last 10 years: No If all of the above answers are "NO", then may proceed with Cephalosporin use.      Patient Measurements: Height: '5\' 6"'$  (167.6 cm) Weight: 164 lb 0.4 oz (74.4 kg) IBW/kg (Calculated) : 63.8 Heparin Dosing Weight:   Vital Signs: Temp: 98.1 F (36.7 C) (10/13 0357) Temp Source: Oral (10/13 0357) BP: 101/51 mmHg (10/13 0600) Pulse Rate: 102 (10/13 0600)  Labs:  Recent Labs  06/20/15 0345 06/21/15 0600 06/22/15 0425  HGB 10.7* 10.3* 11.1*  HCT 31.9* 30.6* 34.3*  PLT 341 330 407*  HEPARINUNFRC 0.40 0.42 0.86*  CREATININE 1.20 1.70*  --     Estimated Creatinine Clearance: 50.6 mL/min (by C-G formula based on Cr of 1.7).   Medications:  Infusions:  . sodium chloride 100 mL/hr at 06/21/15 2312  . fentaNYL infusion INTRAVENOUS 200 mcg/hr (06/22/15 0600)  . heparin    . midazolam (VERSED) infusion 2 mg/hr (06/22/15 0600)  . propofol (DIPRIVAN) infusion  80 mcg/kg/min (06/22/15 9233)    Assessment: Patient with high heparin level.  Patient had small amount of blood in mouth, per RN.  No other heparin issues noted Goal of Therapy:  Heparin level 0.3-0.7 units/ml Monitor platelets by anticoagulation protocol: Yes   Plan:  Decrease heparin to 1100 units/hr Recheck level at [place], Shea Stakes [place] 06/22/2015,6:55 AM

## 2015-06-22 NOTE — Progress Notes (Signed)
CSW received email from [place] that patient family has been given approval for 14 days to travel to [place] to see patient son. Pt family were made aware by [facility] and will be traveling soon. CSW reached out to pt family to see if/when the family from [place] will be arriving.   Belia Heman, [place] Work  [employer] [phone]

## 2015-06-22 NOTE — Progress Notes (Addendum)
CSW met with pt family, at this time pt family form [place] is unable to come due to patient mother being ill. Pt father and sister have chosen to remain in [place] with patient mother at this time. From family, pt mother appears to have become ill once hearing the poor prognosis of patient.   CSW offered support to patient family. Patient wife is very tearful however talking with csw through support from family and pastor interpreting.   CSW suggested getting an interpreter provided by the hospital for meeting with physicians tomorrow regarding plans for patient as this topic may be very difficult for family to interpret as well due to their own emotional attachment with patient and patient wife. Pt wife agreed and csw will proceed with arranging spanish interpreter once meeting time has been finalized. CSW updated patient nurse.   Pt family also requested CSW to be present for meeting with providers to help provide support as necessary.   Pt family members asked csw in hallway regarding how to tell patient children that patient is dying. Patient children are ages 60, 56, and 22 and have only been told that patient is getting medicine to get better. CSW offered to help assist with this conversation as well as identify other resources in the hospital to help have conversation with patient children. CSW consulting with chaplain, patient family pastor, and csw colleagues. CSW to refer patient family including children to bereavement counseling.    Belia Heman, [place] Work  [employer] [phone]  Covering ICU

## 2015-06-22 NOTE — Progress Notes (Signed)
Protocol: Heparin and Ceftazidime Indication: PE and DVT/ PNA  Assessment:   Heparin- Pt is currently on 1100 units/hr of heparin and HL=0.74 (HL goal 0.3-0.7) which is above goal.                   No bleeding per RN.   Ceftazidime- Pt's Scr increased to 3.38 (CrCl=25 ml/min). Pt is currently on Ceftazidime 2gm IV Q8h.  Plan:    Heparin- will decrease heparin rate to 1000 units/hr and check 8hr HL.    Ceftazidime- will decrease Ceftazidime to 2gm IV Q24h based on renal function.  Garnet Sierras, PharmD 06/22/2015

## 2015-06-23 LAB — BASIC METABOLIC PANEL
Anion gap: 12 (ref 5–15)
BUN: 48 mg/dL — AB (ref 6–20)
CHLORIDE: 108 mmol/L (ref 101–111)
CO2: 13 mmol/L — ABNORMAL LOW (ref 22–32)
CREATININE: 3.38 mg/dL — AB (ref 0.61–1.24)
Calcium: 7.6 mg/dL — ABNORMAL LOW (ref 8.9–10.3)
GFR, EST AFRICAN AMERICAN: 24 mL/min — AB (ref >60)
GFR, EST NON AFRICAN AMERICAN: 21 mL/min — AB (ref >60)
Glucose, Bld: 128 mg/dL — ABNORMAL HIGH (ref 65–99)
POTASSIUM: 6.2 mmol/L — AB (ref 3.5–5.1)
SODIUM: 133 mmol/L — AB (ref 135–145)

## 2015-06-23 LAB — CULTURE, BLOOD (ROUTINE X 2)
CULTURE: NO GROWTH
Culture: NO GROWTH

## 2015-06-26 ENCOUNTER — Telehealth: Payer: Self-pay

## 2015-06-26 NOTE — Telephone Encounter (Incomplete)
Received death certificate for burial 07-12-15 for Dr. Lamonte Sakai.  Dr. Lamonte Sakai is off this afternoon but will be at [facility] office tomorrow per schedule.  Will deliver via courier today and await call back when completed.

## 2015-06-27 ENCOUNTER — Ambulatory Visit: Payer: Self-pay | Admitting: Oncology

## 2015-06-27 ENCOUNTER — Other Ambulatory Visit: Payer: Self-pay

## 2015-06-28 ENCOUNTER — Telehealth: Payer: Self-pay

## 2015-06-28 NOTE — Telephone Encounter (Signed)
On 06/28/2015 I received the death certificate back from Doctor Byrum. I got the death certificate ready for pickup and called the funeral home to let them know it was ready for pickup.

## 2015-07-07 ENCOUNTER — Telehealth: Payer: Self-pay | Admitting: *Deleted

## 2015-07-07 NOTE — Telephone Encounter (Signed)
Call received from family asking "what to do with oxygen that's no longer needed because [address] [facility] passed away."  Advised to call [place] and number provided [phone].

## 2015-07-11 NOTE — Progress Notes (Signed)
Date: June 25, 2015 Chart reviewed for concurrent status and case management needs. Will continue to follow patient for changes and needs: Velva Harman, RN, BSN, [place]   [phone]

## 2015-07-11 NOTE — Progress Notes (Signed)
CSW met with pt family. CSW assisted with procedure for funeral home, and provided unit with [facility] contact information:  [phone].   CSW received call from chaplain early regarding patient family wanting to transport patient body to [place]. However this is not case. Patient family to have funeral service and burial in [place].   At this time family not requesting any further assistance. Pt family with strong support from church.   Chad George, [place] Work  [employer] [phone]

## 2015-07-11 NOTE — Progress Notes (Signed)
Chaplain paged at 0426 alerted of the death of Chad George. 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 Church pastor provided spiritual care to all. Chad. Alen children were brought in at about 0700 to see their father and say good-byes. Church pastor has been asked by the family to call the [facility] some time on 14 October to tell which funeral home the family has chosen. Prayer, grief care, and pastoral presence provided as many came to say farewell.  Nursing staff has stated Chad George can remain in the room until as late as 0930 - 1000 for those still arriving. Daytime chaplains alerted for transition.  Chad George. Chad George. DMin Chaplain

## 2015-07-11 NOTE — Progress Notes (Signed)
At 0415 it was noted that the patients heart rate was decreasing and blood pressure had declined. Family at bedside was notified of the change and I suggested that the sister-in-law notify the patients wife to come back in the room. At 0421 the monitor read asystole. The family was at the bedside. I listened for 2 minutes with my stethoscope for an apical pulse and there was no cardiac rhythm detected. I also felt for 1 minute for a carotid pulse and felt no pulse. Jennye Moccasin, RN followed me, listening for 2 minutes for an apical pulse and detected no heart beat. At 0430 the ventilator was removed by Jennye Moccasin, RN without incident. The family remains at bedside with the patient and the chaplain. Luther Parody, RN

## 2015-07-11 DEATH — deceased

## 2015-07-21 ENCOUNTER — Ambulatory Visit (HOSPITAL_COMMUNITY): Payer: Self-pay

## 2015-07-24 ENCOUNTER — Ambulatory Visit: Payer: Self-pay | Admitting: Radiation Oncology

## 2015-08-10 NOTE — Discharge Summary (Signed)
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  Presented with acute SOB on 2015/07/17.  CTA revealed a new PE and pulmonary infiltrate suggestive of lymphangitic spread of his malignancy versus PNA.  Patient was placed on BiPAP but progressed and required intubation and MV. He was treated with anticoagulation, empiric broad abx. Despite all aggressive treatments he required increasing vent needs, FiO2 and PEEP. Status and goals for his care were discussed with his family and it was decided that given his very poor prognosis for recovery that care would no longer be escalated and would focus on comfort 10/12. His work to breathe increased and he ultimately failed even aggressive vent support. He died on 07/22/2015.   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  SIGNIFICANT EVENTS: 07/17/2023 Admission and BiPAP for respiratory failure. Intubated.  10/10 FIO2 requirements high 10/11 FIO2 requirements unchanged. Renal fxn worse. Family trying to bring pt's family in from [place] 10/12 made DNR   Baltazar Apo, MD, PhD 07/13/2015, 11:38 AM [facility] [phone] or if no answer [phone]

## 2016-02-25 IMAGING — CT CT CHEST W/ CM
2 of 5 series · 15 of 46 positions shown, 17 images · IV contrast (OMNIPAQUE)
Comparison: 06/13/2014

ADDENDUM:
The patient had an exam on 10/04/2014 which was more recent than the
comparison study in the report below. Comparing to the more recent
study of 10/04/2014, the abnormal soft tissue and nodularity in the
central left upper and left lower lobes is not substantially
changed. This disease is stable to possibly only minimally
progressed since 10/04/2014. Similar to the lung disease, the
necrotic mediastinal lymphadenopathy is not substantially changed
since 10/04/2014.
CLINICAL DATA: Subsequent encounter for lung cancer

EXAM:
CT CHEST, ABDOMEN, AND PELVIS WITH CONTRAST
TECHNIQUE: Multidetector CT imaging of the chest, abdomen and pelvis was
performed following the standard protocol during bolus
administration of intravenous contrast.
CONTRAST:  100mL OMNIPAQUE IOHEXOL 300 MG/ML  SOLN

[Series 2: cap with st · axial · 0.73mm/px · z∈[-595,-30]mm · 12 of 129 slices shown, 14 images]
[im 8/129  soft-tissue]
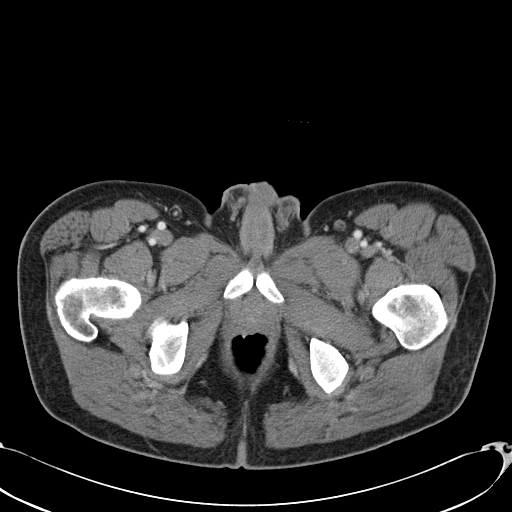
[im 8/129  bone]
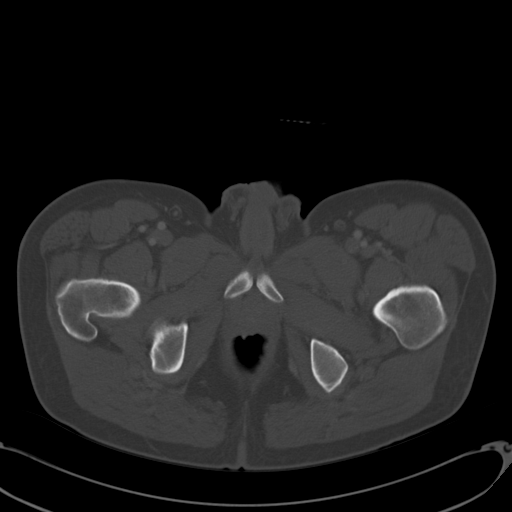
[im 22/129  soft-tissue]
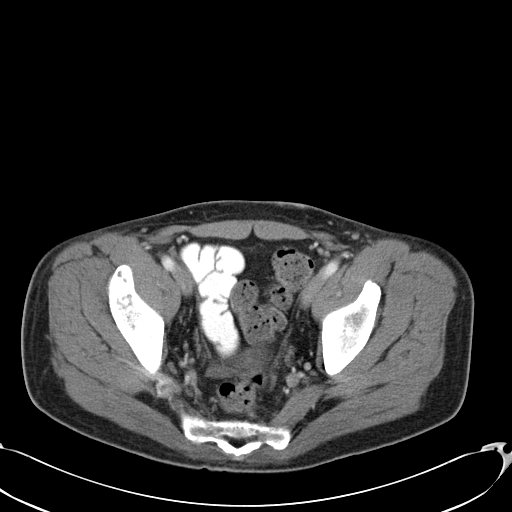
[im 29/129  soft-tissue]
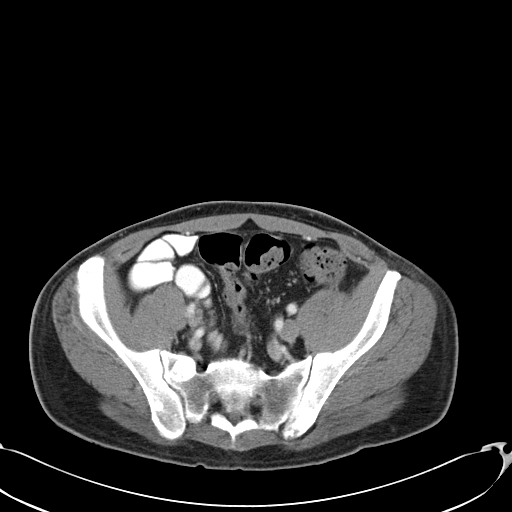
[im 36/129  soft-tissue]
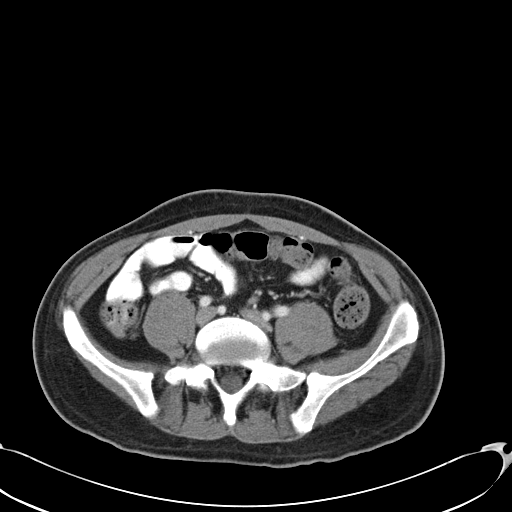
[im 50/129  soft-tissue]
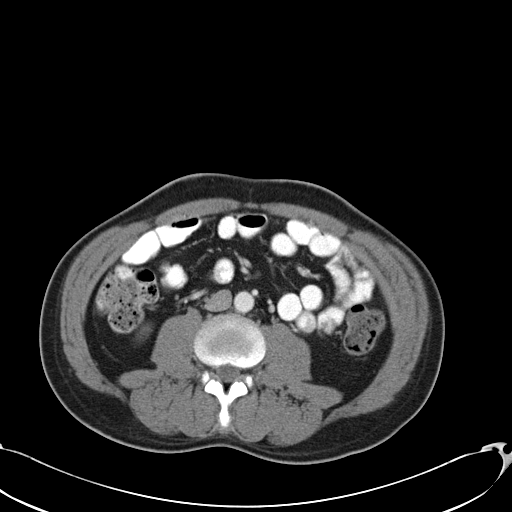
[im 57/129  soft-tissue]
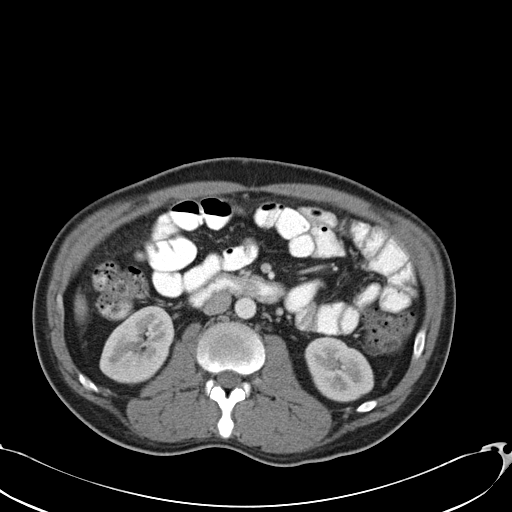
[im 72/129  soft-tissue]
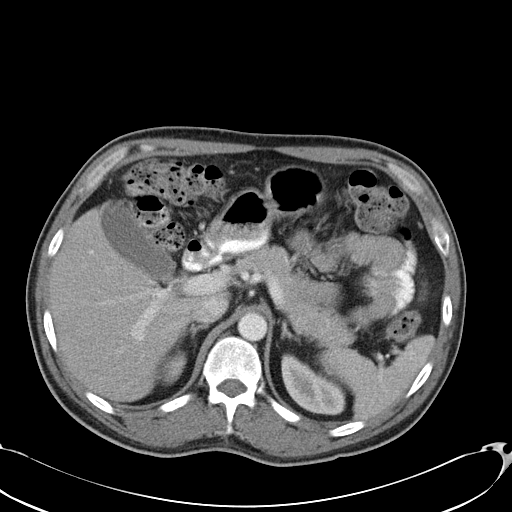
[im 79/129  soft-tissue]
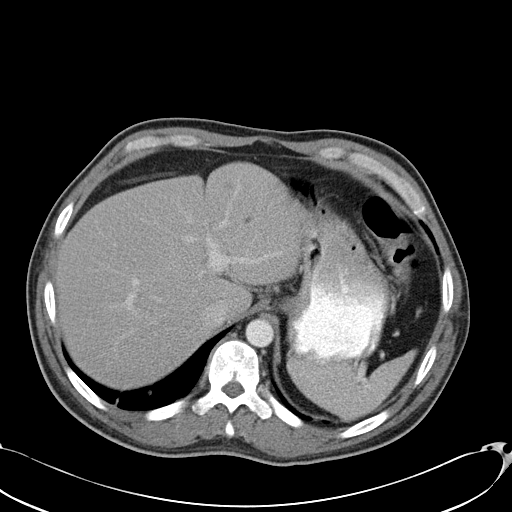
[im 93/129  soft-tissue]
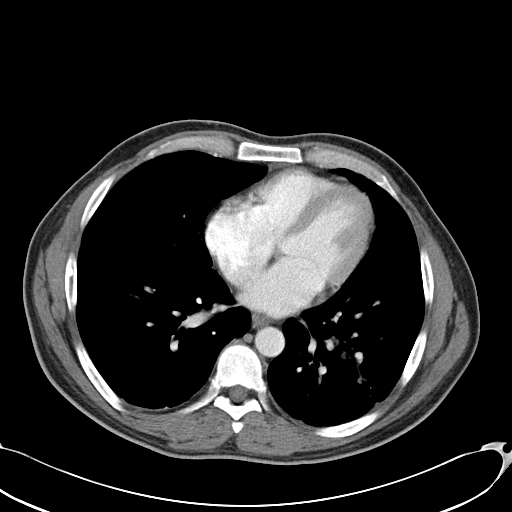
[im 93/129  bone]
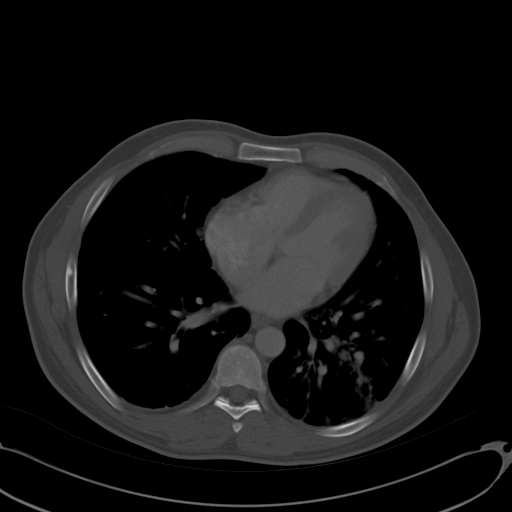
[im 100/129  soft-tissue]
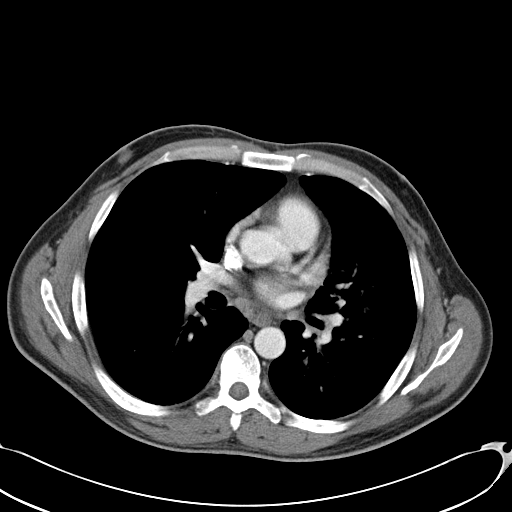
[im 107/129  soft-tissue]
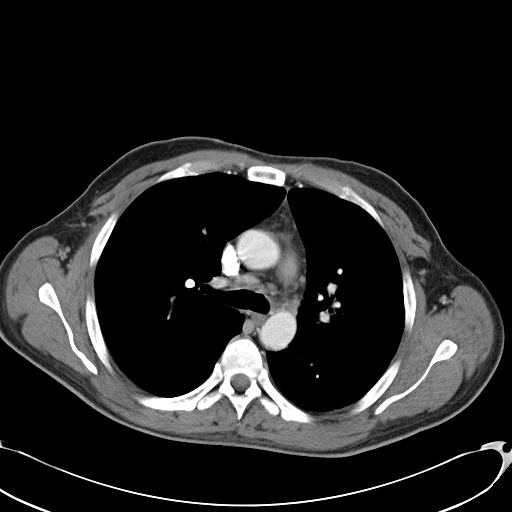
[im 121/129  soft-tissue]
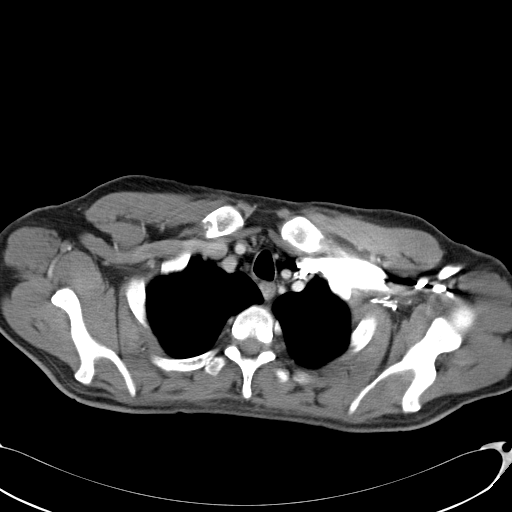

[Series 602: <mpr thick range> · coronal · 1.26mm/px · 3 of 84 slices shown]
[im 28/84  soft-tissue]
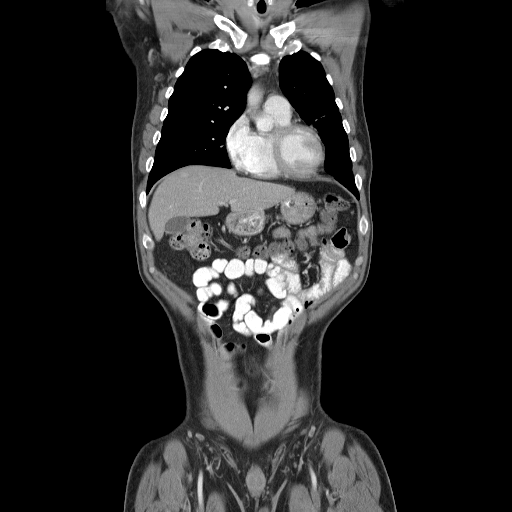
[im 37/84  soft-tissue]
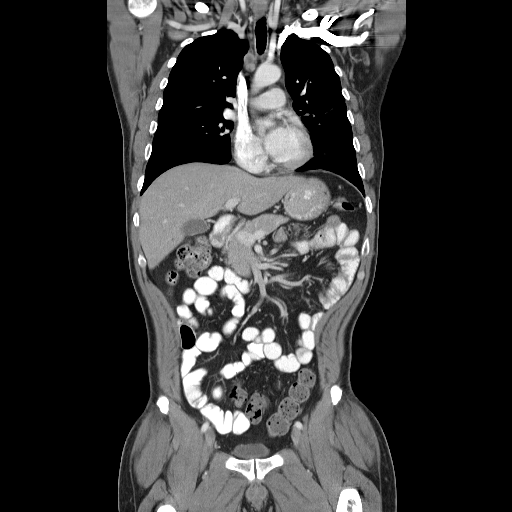
[im 47/84  soft-tissue]
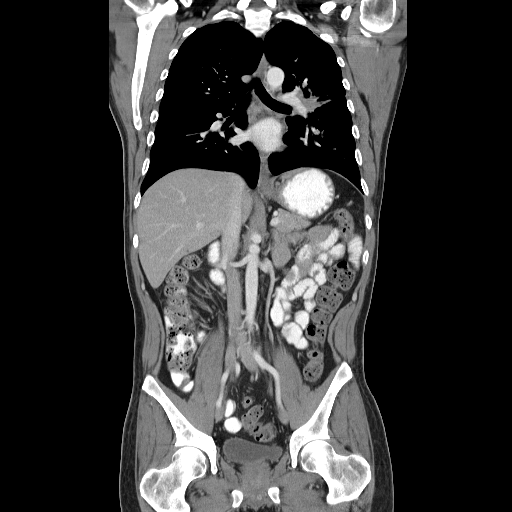

[15 of 46 positions shown; findings below may reference images not displayed]

FINDINGS: CT CHEST FINDINGS

Mediastinum/Nodes: There is no axillary lymphadenopathy. Right
paratracheal lymph node has progressed in the interval, measuring 13
mm in short axis with a necrotic center. 7 mm short axis prevascular
lymph node measured on the prior study is unchanged at 7 mm short
axis. 6 mm short axis subcarinal lymph node on the previous study
has progressed and is now 13 mm in short axis. New left hilar lymph
node or central parahilar lung lesion measures 2.6 x 1.8 cm.

Heart size is normal.  No pericardial effusion.

Lungs/Pleura: Multiple bilateral pulmonary nodules are new in the
interval (see image 21 series 4 for example). A new index nodule in
the right upper lobe centrally measures 4 mm.

There is been progression of soft tissue attenuation in the
posterior left upper lobe since the prior study, consistent with
local recurrence. Fairly extensive nodularity is also seen in the
left lower lobe in a somewhat bronchovascular distribution.

No evidence for pleural effusion.

Musculoskeletal: As before, multiple bony metastases are evident,
but appear progressive (see right glenoid lesion on image 5 series 4
and the right T6 vertebral lesion on image 25 series 4).

CT ABDOMEN AND PELVIS FINDINGS

Hepatobiliary: 10 mm hypo attenuating lesion in the medial segment
left liver is stable. A new ill-defined 7 mm lesion in the lateral
segment left liver (image 51 series 2) is new in the interval. There
is no evidence for gallstones, gallbladder wall thickening, or
pericholecystic fluid. No intrahepatic or extrahepatic biliary
dilation.

Pancreas: No focal mass lesion. No dilatation of the main duct. No
intraparenchymal cyst. No peripancreatic edema.

Spleen: 12 mm water density cystic lesion in the anterior spleen
seen on the previous study is no 18 mm.

Adrenals/Urinary Tract: No adrenal nodule or mass. Right kidney is
normal in appearance. 3 mm nonobstructing stone is seen in the
interpolar left kidney. No evidence for hydroureter. Urinary bladder
is unremarkable.

Stomach/Bowel: Stomach is nondistended. No gastric wall thickening.
No evidence of outlet obstruction. Duodenum is normally positioned
as is the ligament of Treitz. No small bowel wall thickening. No
small bowel dilatation. The appendix is normal. No gross colonic
mass. No colonic wall thickening. No substantial diverticular
change.

Vascular/Lymphatic: No abdominal aortic aneurysm. No gastrohepatic
or hepatoduodenal ligament lymphadenopathy. No retroperitoneal
lymphadenopathy. No pelvic sidewall lymphadenopathy.

Reproductive: Prostate gland and seminal vesicles are unremarkable.

Other: Tiny amount of intraperitoneal free fluid is noted in the
anatomic pelvis.

Musculoskeletal: Numerous bony metastases are evident with clear
progression of the posterior left iliac lesion.
IMPRESSION: 1. Interval development of abnormal soft tissue in nodularity in the
central left upper lobe and left lower lobe, consistent with
recurrent disease.
2. Scattered bilateral pulmonary nodules consistent with metastatic
involvement.
3. Interval development of necrotic mediastinal lymphadenopathy
consistent with metastatic disease.
4. Subtle 7 mm hypodensity in the left liver is nonspecific, but
metastatic disease is not excluded. Hepatic MRI without and with
contrast could be used to further evaluate, as clinically warranted.
5. Interval progression of a water density cystic lesion in the
anterior spleen.

## 2016-08-18 IMAGING — DX DG CHEST 1V PORT
1 series · 1 of 1 positions shown · non-contrast
Comparison: 06/18/2015

CLINICAL DATA: Acute respiratory failure

EXAM:
PORTABLE CHEST 1 VIEW

[chest ap]
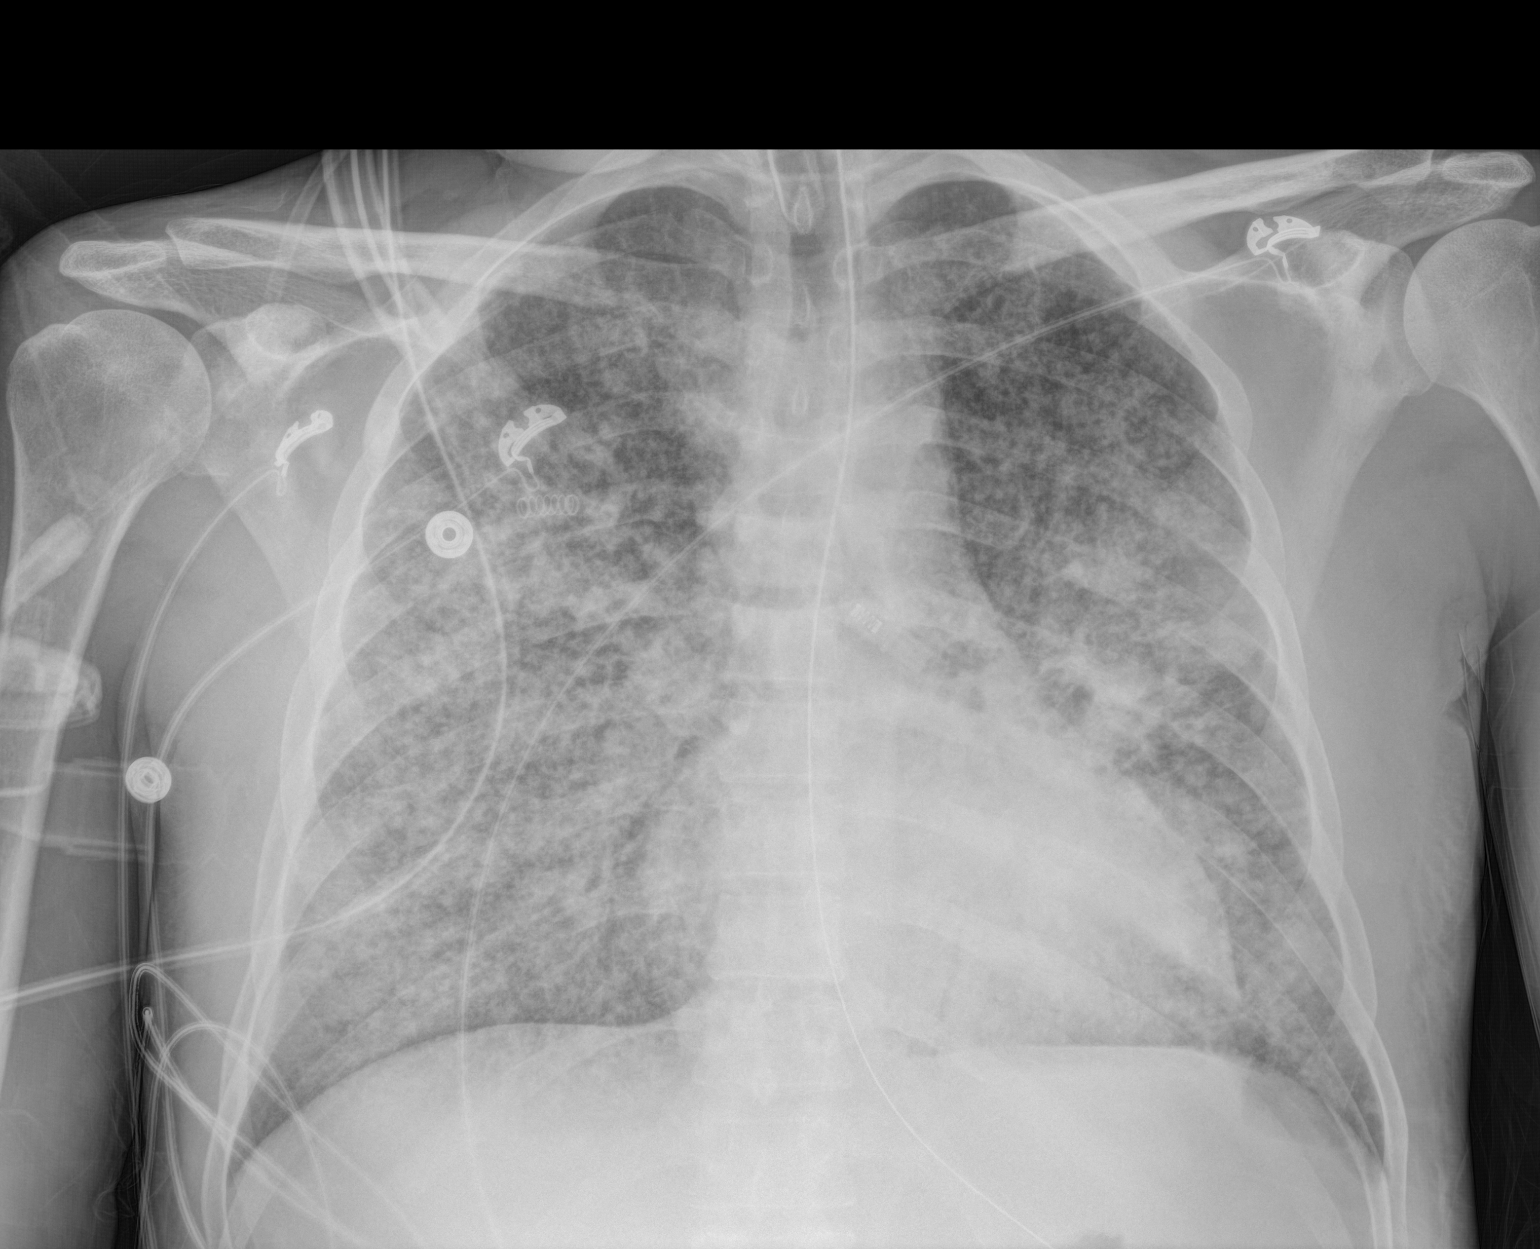

[1 of 1 positions shown; findings below may reference images not displayed]

FINDINGS: Endotracheal tube has been retracted, now 10 cm above the carina. NG
tube enters the stomach. Severe bilateral airspace opacities are
again noted, similar to prior study. Heart is upper limits normal in
size. No visible effusions.
IMPRESSION: Continued severe bilateral airspace disease.

Endotracheal tube retracted, 10 cm above the carina.
